# Patient Record
Sex: Female | Born: 1935
Health system: Southern US, Community
[De-identification: ages and names within clinical notes are randomized; demographics above are authoritative.]

## PROBLEM LIST (undated history)

## (undated) DIAGNOSIS — D649 Anemia, unspecified: Secondary | ICD-10-CM

## (undated) DIAGNOSIS — E7800 Pure hypercholesterolemia, unspecified: Secondary | ICD-10-CM

## (undated) DIAGNOSIS — F0390 Unspecified dementia without behavioral disturbance: Secondary | ICD-10-CM

## (undated) HISTORY — PX: BOWEL RESECTION: SHX1257

## (undated) HISTORY — PX: ABDOMINAL HYSTERECTOMY: SHX81

## (undated) HISTORY — PX: BREAST REDUCTION SURGERY: SHX8

## (undated) HISTORY — DX: Anemia, unspecified: D64.9

---

## 1997-06-09 ENCOUNTER — Encounter: Admission: RE | Admit: 1997-06-09 | Discharge: 1997-09-07 | Payer: Self-pay | Admitting: Internal Medicine

## 2002-11-04 ENCOUNTER — Ambulatory Visit (HOSPITAL_COMMUNITY): Admission: RE | Admit: 2002-11-04 | Discharge: 2002-11-04 | Payer: Self-pay | Admitting: Gastroenterology

## 2002-12-23 ENCOUNTER — Other Ambulatory Visit: Admission: RE | Admit: 2002-12-23 | Discharge: 2002-12-23 | Payer: Self-pay | Admitting: Obstetrics and Gynecology

## 2003-01-14 ENCOUNTER — Ambulatory Visit (HOSPITAL_COMMUNITY): Admission: RE | Admit: 2003-01-14 | Discharge: 2003-01-14 | Payer: Self-pay | Admitting: Obstetrics and Gynecology

## 2004-07-11 ENCOUNTER — Ambulatory Visit (HOSPITAL_COMMUNITY): Admission: RE | Admit: 2004-07-11 | Discharge: 2004-07-11 | Payer: Self-pay | Admitting: Specialist

## 2004-07-24 ENCOUNTER — Inpatient Hospital Stay (HOSPITAL_COMMUNITY): Admission: EM | Admit: 2004-07-24 | Discharge: 2004-07-26 | Payer: Self-pay | Admitting: Emergency Medicine

## 2005-10-11 ENCOUNTER — Inpatient Hospital Stay (HOSPITAL_COMMUNITY): Admission: EM | Admit: 2005-10-11 | Discharge: 2005-10-23 | Payer: Self-pay | Admitting: Emergency Medicine

## 2005-11-05 ENCOUNTER — Emergency Department (HOSPITAL_COMMUNITY): Admission: EM | Admit: 2005-11-05 | Discharge: 2005-11-06 | Payer: Self-pay | Admitting: Emergency Medicine

## 2011-04-13 ENCOUNTER — Telehealth: Payer: Self-pay

## 2011-04-13 NOTE — Telephone Encounter (Signed)
.  [facility]  Sent paperwork to Dr. Merla Riches a couple of weeks ago and has not rec'd back Fax [phone] Phone [phone]

## 2011-04-17 NOTE — Telephone Encounter (Signed)
Forms refaxed

## 2011-04-17 NOTE — Telephone Encounter (Signed)
Please refax paperwork that is attached to her chart.  It was originally faxed on 04/14/11.

## 2011-07-24 ENCOUNTER — Ambulatory Visit (INDEPENDENT_AMBULATORY_CARE_PROVIDER_SITE_OTHER): Payer: Medicare Other | Admitting: Internal Medicine

## 2011-07-24 VITALS — BP 115/57 | HR 67 | Temp 98.3°F | Resp 16 | Ht 61.5 in | Wt 120.4 lb

## 2011-07-24 DIAGNOSIS — K5790 Diverticulosis of intestine, part unspecified, without perforation or abscess without bleeding: Secondary | ICD-10-CM

## 2011-07-24 DIAGNOSIS — E785 Hyperlipidemia, unspecified: Secondary | ICD-10-CM | POA: Insufficient documentation

## 2011-07-24 DIAGNOSIS — E119 Type 2 diabetes mellitus without complications: Secondary | ICD-10-CM | POA: Insufficient documentation

## 2011-07-24 DIAGNOSIS — K56609 Unspecified intestinal obstruction, unspecified as to partial versus complete obstruction: Secondary | ICD-10-CM

## 2011-07-24 DIAGNOSIS — D509 Iron deficiency anemia, unspecified: Secondary | ICD-10-CM

## 2011-07-24 DIAGNOSIS — R630 Anorexia: Secondary | ICD-10-CM

## 2011-07-24 DIAGNOSIS — F341 Dysthymic disorder: Secondary | ICD-10-CM

## 2011-07-24 DIAGNOSIS — R2681 Unsteadiness on feet: Secondary | ICD-10-CM

## 2011-07-24 DIAGNOSIS — R269 Unspecified abnormalities of gait and mobility: Secondary | ICD-10-CM

## 2011-07-24 DIAGNOSIS — R634 Abnormal weight loss: Secondary | ICD-10-CM

## 2011-07-24 DIAGNOSIS — F411 Generalized anxiety disorder: Secondary | ICD-10-CM

## 2011-07-24 DIAGNOSIS — R7611 Nonspecific reaction to tuberculin skin test without active tuberculosis: Secondary | ICD-10-CM

## 2011-07-24 LAB — COMPREHENSIVE METABOLIC PANEL
ALT: 11 U/L (ref 0–35)
AST: 20 U/L (ref 0–37)
Albumin: 4.4 g/dL (ref 3.5–5.2)
Alkaline Phosphatase: 69 U/L (ref 39–117)
BUN: 24 mg/dL — ABNORMAL HIGH (ref 6–23)
CO2: 26 mEq/L (ref 19–32)
Calcium: 10 mg/dL (ref 8.4–10.5)
Chloride: 109 mEq/L (ref 96–112)
Creat: 0.93 mg/dL (ref 0.50–1.10)
Glucose, Bld: 129 mg/dL — ABNORMAL HIGH (ref 70–99)
Potassium: 4.4 mEq/L (ref 3.5–5.3)
Sodium: 144 mEq/L (ref 135–145)
Total Bilirubin: 0.4 mg/dL (ref 0.3–1.2)
Total Protein: 7.5 g/dL (ref 6.0–8.3)

## 2011-07-24 LAB — POCT CBC
Granulocyte percent: 61.4 %G (ref 37–80)
HCT, POC: 36.4 % — AB (ref 37.7–47.9)
Hemoglobin: 11.5 g/dL — AB (ref 12.2–16.2)
Lymph, poc: 2.4 (ref 0.6–3.4)
MCH, POC: 29.2 pg (ref 27–31.2)
MCHC: 31.6 g/dL — AB (ref 31.8–35.4)
MCV: 92.3 fL (ref 80–97)
MID (cbc): 0.3 (ref 0–0.9)
MPV: 8.3 fL (ref 0–99.8)
POC Granulocyte: 4.4 (ref 2–6.9)
POC LYMPH PERCENT: 33.8 %L (ref 10–50)
POC MID %: 4.8 %M (ref 0–12)
Platelet Count, POC: 319 10*3/uL (ref 142–424)
RBC: 3.94 M/uL — AB (ref 4.04–5.48)
RDW, POC: 14.7 %
WBC: 7.2 10*3/uL (ref 4.6–10.2)

## 2011-07-24 LAB — GLUCOSE, POCT (MANUAL RESULT ENTRY): POC Glucose: 103 mg/dl — AB (ref 70–99)

## 2011-07-24 LAB — POCT GLYCOSYLATED HEMOGLOBIN (HGB A1C): Hemoglobin A1C: 7.4

## 2011-07-24 MED ORDER — MECLIZINE HCL 12.5 MG PO TABS: 25.0000 mg | ORAL_TABLET | Freq: Every day | ORAL | Status: AC

## 2011-07-24 NOTE — Progress Notes (Signed)
Subjective:    Patient ID: Marcia Campbell, female    DOB: [birth year], 78 y.o.   MRN: [id number]  HPIYesterday when getting out of the bed she noticed an unsteady gait. She felt as if drunk. As she would try to walk she would fall to one side usually the left and has to steady herself. She noticed no change in vision and the room was not spinning. This persisted throughout the day. Today she is better but it is still present and she changes positions. There is no headache, no weakness, and no syncope. Her daughter has had to lead her around for the last 24 hours to keep her from falling. This is happened twice before in the last 5 years. After the second episode she was referred to neurology but they could not determine an etiology as in both other cases this resolves spontaneously within several days. Her daughter who is her primary caretaker has noticed several other recent symptoms. She has had a decreased appetite he is not drinking as much which has also been noticed at her day treatment program. She is often been asking for sugar. She is sleeping more than usual. Note that in the fall of 2012 her daughter called and asked for sleep medication for her. For the last 3 or 4 months she has been falling asleep shortly after dinner and sleeping 10 or 11 hours. She also has exhibited increased anxiety. As she arrives home from her day program with her daughter she has 45 minutes to an hour of fear that someone is broken in the house and has gone into her bedroom and taking things, and this anxiety is not relieved by inspecting everything and finding nothing wrong. She also expresses fears she drives into the neighborhood that no one is watching out for them and something bad might happen. She has been at the same dose of Zoloft for long-term. Her daughter notices no new depression symptoms.   Soc hx=[facility](adult daycare)-[facility] in [place]. Has living will and daughter is  POA  Review of Systems  Constitutional: Positive for appetite change and unexpected weight change. Negative for fever, activity change and fatigue.       She has lost 5 or 6 pounds since last year  HENT: Negative for hearing loss, ear pain, trouble swallowing, tinnitus and ear discharge.   Eyes: Negative for visual disturbance.  Respiratory: Negative for shortness of breath.   Cardiovascular: Negative for chest pain, palpitations and leg swelling.  Gastrointestinal: Negative for nausea, abdominal pain and constipation.  Genitourinary: Negative for urgency, frequency and difficulty urinating.  Musculoskeletal: Negative for myalgias, joint swelling and arthralgias.  Skin: Negative for rash.  Neurological: Negative for tremors, facial asymmetry, speech difficulty, weakness and headaches.       The tremor and dystonic movements she had a few years ago which were evaluated by neurology have resolved  Psychiatric/Behavioral: Negative for suicidal ideas, hallucinations and confusion.       Objective:   Physical Exam  Constitutional: She is oriented to person, place, and time. She appears well-developed and well-nourished. No distress.  HENT:  Right Ear: Tympanic membrane is not injected and not retracted. No middle ear effusion. No decreased hearing is noted.  Left Ear: Tympanic membrane is not injected and not retracted.  No middle ear effusion. No decreased hearing is noted.  Nose: No mucosal edema.  Mouth/Throat: Oropharynx is clear and moist and mucous membranes are normal. Normal dentition.  Eyes: Conjunctivae  and EOM are normal. Pupils are equal, round, and reactive to light.  Neck: Neck supple. No thyromegaly present.  Cardiovascular: Normal rate, regular rhythm, normal heart sounds and intact distal pulses.  Exam reveals no gallop.   No murmur heard.      No carotid bruit  Pulmonary/Chest: Effort normal and breath sounds normal.  Abdominal: Soft. She exhibits no distension and no  mass. There is no tenderness.       No organomegaly/no abdominal bruit  Musculoskeletal: She exhibits no edema.  Lymphadenopathy:    She has no cervical adenopathy.  Neurological: She is alert and oriented to person, place, and time. She has normal reflexes. No cranial nerve deficit.       Finger to nose intact without tremor Romberg negative Gait is not initially stable but she corrects herself quickly She was able to walk the length of the hall without touching the wall Deep tendon reflexes are 4+ in the lower [place]'s and 3+ in the upper extremities and symmetrical There no sensory losses Lying to sitting reproduce dizziness slightly without nystagmus and this resolved quickly Sitting to standing was the same  Psychiatric: She has a normal mood and affect. Thought content normal.        Results for orders placed in visit on 07/24/11  POCT CBC      Component Value Range   WBC 7.2  4.6 - 10.2 (K/uL)   Lymph, poc 2.4  0.6 - 3.4    POC LYMPH PERCENT 33.8  10 - 50 (%L)   MID (cbc) 0.3  0 - 0.9    POC MID % 4.8  0 - 12 (%M)   POC Granulocyte 4.4  2 - 6.9    Granulocyte percent 61.4  37 - 80 (%G)   RBC 3.94 (*) 4.04 - 5.48 (M/uL)   Hemoglobin 11.5 (*) 12.2 - 16.2 (g/dL)   HCT, POC 16.1 (*) 09.6 - 47.9 (%)   MCV 92.3  80 - 97 (fL)   MCH, POC 29.2  27 - 31.2 (pg)   MCHC 31.6 (*) 31.8 - 35.4 (g/dL)   RDW, POC 04.5     Platelet Count, POC 319  142 - 424 (K/uL)   MPV 8.3  0 - 99.8 (fL)  POCT GLYCOSYLATED HEMOGLOBIN (HGB A1C)      Component Value Range   Hemoglobin A1C 7.4    GLUCOSE, POCT (MANUAL RESULT ENTRY)      Component Value Range   POC Glucose 103 (*) 70 - 99 (mg/dl)    Assessment & Plan:   1. Poor appetite  POCT glucose (manual entry)  2. Unsteady gait    3. Abnormal weight loss  POCT CBC, Comprehensive metabolic panel, TSH  4. DM (diabetes mellitus) - Increase A1c from 7.0-7.4 in the last 9 months POCT glycosylated hemoglobin (Hb A1C), POCT glucose (manual entry)   5. GAD (generalized anxiety disorder) With depression and now with new symptoms of paranoia and/or obsessive thinking   6. Hyperlipemia on zocor    7. Anemia-normocytic/mild   Use meclizine 12.5 mg at at bedtime for the next 3-7 days If these symptoms do not resolve as they have in the past then further workup will be necessary Check metabolic profile and TSH After labs will consider something to stabilize her anxiety exacerbation

## 2011-07-25 ENCOUNTER — Encounter: Payer: Self-pay | Admitting: Internal Medicine

## 2011-07-25 DIAGNOSIS — D509 Iron deficiency anemia, unspecified: Secondary | ICD-10-CM | POA: Insufficient documentation

## 2011-07-25 DIAGNOSIS — R7611 Nonspecific reaction to tuberculin skin test without active tuberculosis: Secondary | ICD-10-CM | POA: Insufficient documentation

## 2011-07-25 DIAGNOSIS — K56609 Unspecified intestinal obstruction, unspecified as to partial versus complete obstruction: Secondary | ICD-10-CM | POA: Insufficient documentation

## 2011-07-25 DIAGNOSIS — H409 Unspecified glaucoma: Secondary | ICD-10-CM | POA: Insufficient documentation

## 2011-07-25 DIAGNOSIS — K5790 Diverticulosis of intestine, part unspecified, without perforation or abscess without bleeding: Secondary | ICD-10-CM | POA: Insufficient documentation

## 2011-07-25 LAB — TSH: TSH: 2.541 u[IU]/mL (ref 0.350–4.500)

## 2011-08-17 ENCOUNTER — Encounter (HOSPITAL_COMMUNITY): Payer: Self-pay | Admitting: *Deleted

## 2011-08-17 ENCOUNTER — Emergency Department (HOSPITAL_COMMUNITY)
Admission: EM | Admit: 2011-08-17 | Discharge: 2011-08-17 | Disposition: A | Discharge status: Home / Self Care | Payer: No Typology Code available for payment source | Attending: Emergency Medicine | Admitting: Emergency Medicine

## 2011-08-17 ENCOUNTER — Emergency Department (HOSPITAL_COMMUNITY): Payer: No Typology Code available for payment source

## 2011-08-17 DIAGNOSIS — Y92410 Unspecified street and highway as the place of occurrence of the external cause: Secondary | ICD-10-CM | POA: Insufficient documentation

## 2011-08-17 DIAGNOSIS — Y998 Other external cause status: Secondary | ICD-10-CM | POA: Insufficient documentation

## 2011-08-17 DIAGNOSIS — S335XXA Sprain of ligaments of lumbar spine, initial encounter: Secondary | ICD-10-CM | POA: Insufficient documentation

## 2011-08-17 DIAGNOSIS — Z794 Long term (current) use of insulin: Secondary | ICD-10-CM | POA: Insufficient documentation

## 2011-08-17 DIAGNOSIS — E119 Type 2 diabetes mellitus without complications: Secondary | ICD-10-CM | POA: Insufficient documentation

## 2011-08-17 DIAGNOSIS — Z79899 Other long term (current) drug therapy: Secondary | ICD-10-CM | POA: Insufficient documentation

## 2011-08-17 DIAGNOSIS — S39012A Strain of muscle, fascia and tendon of lower back, initial encounter: Secondary | ICD-10-CM

## 2011-08-17 DIAGNOSIS — F0390 Unspecified dementia without behavioral disturbance: Secondary | ICD-10-CM | POA: Insufficient documentation

## 2011-08-17 DIAGNOSIS — E7800 Pure hypercholesterolemia, unspecified: Secondary | ICD-10-CM | POA: Insufficient documentation

## 2011-08-17 HISTORY — DX: Unspecified dementia, unspecified severity, without behavioral disturbance, psychotic disturbance, mood disturbance, and anxiety: F03.90

## 2011-08-17 HISTORY — DX: Pure hypercholesterolemia, unspecified: E78.00

## 2011-08-17 MED ORDER — TRAMADOL HCL 50 MG PO TABS
50.0000 mg | ORAL_TABLET | Freq: Once | ORAL | Status: AC
  Administered 2011-08-17: 50 mg via ORAL
  Filled 2011-08-17: qty 1

## 2011-08-17 MED ORDER — TRAMADOL HCL 50 MG PO TABS: 50.0000 mg | ORAL_TABLET | Freq: Four times a day (QID) | ORAL | Status: AC | PRN
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
## 2011-08-17 NOTE — Discharge Instructions (Signed)

## 2011-08-17 NOTE — ED Notes (Signed)
Pt in xray
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
## 2011-08-17 NOTE — ED Notes (Signed)
Patient back from  X-ray 

## 2011-08-29 ENCOUNTER — Ambulatory Visit (INDEPENDENT_AMBULATORY_CARE_PROVIDER_SITE_OTHER): Payer: Medicare Other | Admitting: Internal Medicine

## 2011-08-29 VITALS — BP 119/62 | HR 64 | Temp 98.6°F | Resp 16 | Ht 61.0 in | Wt 120.0 lb

## 2011-08-29 DIAGNOSIS — F419 Anxiety disorder, unspecified: Secondary | ICD-10-CM

## 2011-08-29 DIAGNOSIS — R42 Dizziness and giddiness: Secondary | ICD-10-CM

## 2011-08-29 DIAGNOSIS — M542 Cervicalgia: Secondary | ICD-10-CM

## 2011-08-29 DIAGNOSIS — F411 Generalized anxiety disorder: Secondary | ICD-10-CM

## 2011-08-29 MED ORDER — MECLIZINE HCL 25 MG PO TABS: 25.0000 mg | ORAL_TABLET | Freq: Three times a day (TID) | ORAL | Status: AC | PRN
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
## 2011-11-25 ENCOUNTER — Other Ambulatory Visit: Payer: Self-pay | Admitting: Internal Medicine

## 2011-11-25 NOTE — Telephone Encounter (Signed)
Pt daughter calling to let Dr know that her mom is out of her metphormin and would like to know if rx could be sent in today, she states pharmacy has already faxed in rx to our office. Arie Sabina [phone]

## 2011-11-27 ENCOUNTER — Other Ambulatory Visit: Payer: Self-pay

## 2011-11-27 MED ORDER — METFORMIN HCL 500 MG PO TABS: 500.0000 mg | ORAL_TABLET | Freq: Every day | ORAL | Status: DC

## 2011-11-27 MED ORDER — SIMVASTATIN 20 MG PO TABS: 20.0000 mg | ORAL_TABLET | Freq: Every evening | ORAL | Status: DC

## 2011-11-27 MED ORDER — SERTRALINE HCL 100 MG PO TABS: 100.0000 mg | ORAL_TABLET | Freq: Every day | ORAL | Status: DC

## 2012-01-01 ENCOUNTER — Ambulatory Visit (INDEPENDENT_AMBULATORY_CARE_PROVIDER_SITE_OTHER): Payer: Medicare Other | Admitting: Internal Medicine

## 2012-01-01 VITALS — BP 118/70 | HR 70 | Temp 98.6°F | Resp 18 | Ht 61.0 in | Wt 120.4 lb

## 2012-01-01 DIAGNOSIS — M533 Sacrococcygeal disorders, not elsewhere classified: Secondary | ICD-10-CM

## 2012-01-01 DIAGNOSIS — R413 Other amnesia: Secondary | ICD-10-CM

## 2012-01-01 DIAGNOSIS — E785 Hyperlipidemia, unspecified: Secondary | ICD-10-CM

## 2012-01-01 DIAGNOSIS — E119 Type 2 diabetes mellitus without complications: Secondary | ICD-10-CM

## 2012-01-01 DIAGNOSIS — F419 Anxiety disorder, unspecified: Secondary | ICD-10-CM

## 2012-01-01 DIAGNOSIS — D649 Anemia, unspecified: Secondary | ICD-10-CM

## 2012-01-01 LAB — POCT CBC
Granulocyte percent: 56.3 %G (ref 37–80)
HCT, POC: 39.1 % (ref 37.7–47.9)
Hemoglobin: 11.9 g/dL — AB (ref 12.2–16.2)
Lymph, poc: 2.6 (ref 0.6–3.4)
MCH, POC: 29.3 pg (ref 27–31.2)
MCHC: 30.4 g/dL — AB (ref 31.8–35.4)
MCV: 96.2 fL (ref 80–97)
MID (cbc): 0.4 (ref 0–0.9)
MPV: 8.2 fL (ref 0–99.8)
POC Granulocyte: 3.9 (ref 2–6.9)
POC LYMPH PERCENT: 37.2 %L (ref 10–50)
POC MID %: 6.5 %M (ref 0–12)
Platelet Count, POC: 297 10*3/uL (ref 142–424)
RBC: 4.06 M/uL (ref 4.04–5.48)
RDW, POC: 14.1 %
WBC: 6.9 10*3/uL (ref 4.6–10.2)

## 2012-01-01 LAB — POCT GLYCOSYLATED HEMOGLOBIN (HGB A1C): Hemoglobin A1C: 7.2
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
## 2012-01-02 LAB — COMPREHENSIVE METABOLIC PANEL
ALT: 10 U/L (ref 0–35)
AST: 17 U/L (ref 0–37)
Albumin: 4.4 g/dL (ref 3.5–5.2)
Alkaline Phosphatase: 70 U/L (ref 39–117)
BUN: 21 mg/dL (ref 6–23)
CO2: 26 mEq/L (ref 19–32)
Calcium: 10.2 mg/dL (ref 8.4–10.5)
Chloride: 105 mEq/L (ref 96–112)
Creat: 1.07 mg/dL (ref 0.50–1.10)
Glucose, Bld: 123 mg/dL — ABNORMAL HIGH (ref 70–99)
Potassium: 4.3 mEq/L (ref 3.5–5.3)
Sodium: 140 mEq/L (ref 135–145)
Total Bilirubin: 0.4 mg/dL (ref 0.3–1.2)
Total Protein: 7.6 g/dL (ref 6.0–8.3)

## 2012-01-02 LAB — LIPID PANEL
Cholesterol: 236 mg/dL — ABNORMAL HIGH (ref 0–200)
HDL: 68 mg/dL (ref >39)
LDL Cholesterol: 150 mg/dL — ABNORMAL HIGH (ref 0–99)
Total CHOL/HDL Ratio: 3.5 Ratio
Triglycerides: 90 mg/dL (ref <150)
VLDL: 18 mg/dL (ref 0–40)

## 2012-01-03 LAB — VITAMIN B12: Vitamin B-12: 605 pg/mL (ref 211–911)

## 2012-01-03 LAB — FERRITIN: Ferritin: 137 ng/mL (ref 10–291)

## 2012-01-05 ENCOUNTER — Encounter: Payer: Self-pay | Admitting: Internal Medicine

## 2012-01-05 MED ORDER — SERTRALINE HCL 100 MG PO TABS: 100.0000 mg | ORAL_TABLET | Freq: Every day | ORAL | Status: DC

## 2012-01-05 MED ORDER — SIMVASTATIN 40 MG PO TABS: 40.0000 mg | ORAL_TABLET | Freq: Every evening | ORAL | Status: DC

## 2012-01-05 MED ORDER — METFORMIN HCL 500 MG PO TABS: 500.0000 mg | ORAL_TABLET | Freq: Every day | ORAL | Status: DC

## 2012-01-05 MED ORDER — INSULIN GLARGINE 100 UNIT/ML ~~LOC~~ SOLN: 12.0000 [IU] | Freq: Every day | SUBCUTANEOUS | Status: DC

## 2012-01-05 NOTE — Addendum Note (Signed)
Addended by: Tonye Pearson on: 01/05/2012 02:38 PM   Modules accepted: Orders

## 2012-03-14 ENCOUNTER — Telehealth: Payer: Self-pay

## 2012-03-14 DIAGNOSIS — F419 Anxiety disorder, unspecified: Secondary | ICD-10-CM

## 2012-03-14 DIAGNOSIS — F0390 Unspecified dementia without behavioral disturbance: Secondary | ICD-10-CM

## 2012-03-14 DIAGNOSIS — F341 Dysthymic disorder: Secondary | ICD-10-CM

## 2012-03-14 NOTE — Telephone Encounter (Signed)
LMOM to CB. 

## 2012-03-14 NOTE — Telephone Encounter (Signed)
She is not wanting to know if she can get additional meds for Anxiety. The Zoloft helped for a while but does not seem to be very helpful now. Daughter wants to know if this can be adjusted, since mother is increasingly confused and agitated. Please advise. Your last OV indicated she may need to see a neurologist for dementia, do you want to do this? And /or change / adjust meds/ please advise.
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
## 2012-03-15 MED ORDER — MIRTAZAPINE 7.5 MG PO TABS: 7.5000 mg | ORAL_TABLET | Freq: Every day | ORAL | Status: DC

## 2012-03-15 NOTE — Telephone Encounter (Signed)
Suspect increased anxiety due to increasing dementia  Set up neuro eval Add remeron 7.5 to zoloft until then I left this message with her daughter

## 2012-03-16 NOTE — Telephone Encounter (Signed)
Pt daughter called back checking on status of the medication request not sure that she received the message from below dated 03/15/12   Best number [phone]

## 2012-03-17 NOTE — Telephone Encounter (Signed)
Spoke with pt daughter and they have not got med.  Med may have been sent to [facility].  Will call [facility] and cancel rx and then call into [facility].

## 2012-03-19 ENCOUNTER — Telehealth: Payer: Self-pay

## 2012-03-19 NOTE — Telephone Encounter (Signed)
Medical form was sent, but was not completed. They need list, completed and printed and faxed to her. Fax [phone]

## 2012-03-19 NOTE — Telephone Encounter (Signed)
Marcia [facility] from [facility] is needing to talk with someone about this patients medication   Best number is [phone]  She stated that she feels like she is getting the run around because she was given dr [facility]olittles number [facility] this morning to help with this and then was told to call back here

## 2012-03-20 ENCOUNTER — Telehealth: Payer: Self-pay

## 2012-03-20 DIAGNOSIS — F341 Dysthymic disorder: Secondary | ICD-10-CM

## 2012-03-20 NOTE — Telephone Encounter (Signed)
Daughter wants to know when she can see changes in her mother with this new medication.   Please call [phone] (M)

## 2012-03-21 NOTE — Telephone Encounter (Signed)
Set up neuro eval  Add remeron 7.5 to zoloft until then  I left this message with her daughter

## 2012-03-22 NOTE — Telephone Encounter (Signed)
Called and spoke to daughter, and Marcia Campbell does still tend to have episodes of confusion/ anger mid day. Is disruptive and takes a while for her to calm down / about 1/2 an hour. meds do not seem to be helping yet. Daughter wants to know if this medication is going to be helpful, how long will it take to notice a change?

## 2012-03-23 NOTE — Telephone Encounter (Signed)
10days til works/call if not helpful

## 2012-03-24 NOTE — Telephone Encounter (Signed)
Called to advise. She is going out of town, needs refill of the Remeron. I have advised this was written with a refill. She will get from pharmacy.

## 2012-03-27 ENCOUNTER — Telehealth: Payer: Self-pay

## 2012-03-27 ENCOUNTER — Other Ambulatory Visit: Payer: Self-pay | Admitting: Internal Medicine

## 2012-03-27 NOTE — Telephone Encounter (Signed)
Notified rx sent in to [facility] today.

## 2012-03-27 NOTE — Telephone Encounter (Deleted)
PTS DAUGHTER IS CALLING IN REGAUARDS OF

## 2012-03-27 NOTE — Telephone Encounter (Signed)
PTS DAUGHTER IS CALLING IN REGARDS TO PT'S REMERON, SHE STATES THAT AMY INFORMED HER THAT THERE ARE REFILLS ON THIS MEDICATION BUT THE PHARMACY IS STATING THAT THERE ARE NOT ANY REFILLS. BEST# Cordelia Poche [phone]

## 2012-04-01 ENCOUNTER — Other Ambulatory Visit: Payer: Self-pay | Admitting: Diagnostic Neuroimaging

## 2012-04-01 DIAGNOSIS — F0390 Unspecified dementia without behavioral disturbance: Secondary | ICD-10-CM

## 2012-04-10 ENCOUNTER — Ambulatory Visit
Admission: RE | Admit: 2012-04-10 | Discharge: 2012-04-10 | Disposition: A | Discharge status: Home / Self Care | Payer: Medicare Other | Source: Ambulatory Visit | Attending: Diagnostic Neuroimaging | Admitting: Diagnostic Neuroimaging

## 2012-04-10 DIAGNOSIS — F0390 Unspecified dementia without behavioral disturbance: Secondary | ICD-10-CM

## 2012-04-20 ENCOUNTER — Telehealth: Payer: Self-pay

## 2012-04-20 NOTE — Telephone Encounter (Signed)
PT IS TAKING NEW MEDS AND IS MORE AGITATED  PLEASE HAVE DR. DOOLITTLE CALL [phone] [name]

## 2012-04-22 NOTE — Telephone Encounter (Signed)
More confused Doesn't recognize daughter now aricept started 2 weeks ago by Dr. Marjory Lies Also from orthopedics on HC/Voltaren  Daughter feels that she can no longer care for mom at home and we will discuss the appropriate paperwork I advised stopping the Voltaren and hydrocodone and if her confusion does not improve to ask neurology about Aricept Remeron is also a new drug we tried to control more for anxiety and this could be discontinued as it may not be offering much benefit

## 2012-04-22 NOTE — Telephone Encounter (Signed)
Do you want me to call pt. What questions should I ask pt? Please advise.

## 2012-04-26 ENCOUNTER — Ambulatory Visit (INDEPENDENT_AMBULATORY_CARE_PROVIDER_SITE_OTHER): Payer: MEDICARE | Admitting: Internal Medicine

## 2012-04-26 ENCOUNTER — Ambulatory Visit: Payer: Medicare Other

## 2012-04-26 VITALS — BP 115/64 | HR 82 | Temp 99.2°F | Resp 16 | Ht 62.0 in | Wt 113.0 lb

## 2012-04-26 DIAGNOSIS — R195 Other fecal abnormalities: Secondary | ICD-10-CM

## 2012-04-26 DIAGNOSIS — F0391 Unspecified dementia with behavioral disturbance: Secondary | ICD-10-CM

## 2012-04-26 DIAGNOSIS — F29 Unspecified psychosis not due to a substance or known physiological condition: Secondary | ICD-10-CM

## 2012-04-26 DIAGNOSIS — R109 Unspecified abdominal pain: Secondary | ICD-10-CM

## 2012-04-26 DIAGNOSIS — R451 Restlessness and agitation: Secondary | ICD-10-CM

## 2012-04-26 DIAGNOSIS — IMO0002 Reserved for concepts with insufficient information to code with codable children: Secondary | ICD-10-CM

## 2012-04-26 DIAGNOSIS — R410 Disorientation, unspecified: Secondary | ICD-10-CM

## 2012-04-26 MED ORDER — CITALOPRAM HYDROBROMIDE 40 MG PO TABS: 40.0000 mg | ORAL_TABLET | Freq: Every day | ORAL | Status: DC

## 2012-04-26 MED ORDER — POLYETHYLENE GLYCOL 3350 17 GM/SCOOP PO POWD: 17.0000 g | Freq: Every day | ORAL | Status: DC

## 2012-04-26 MED ORDER — RISPERIDONE 1 MG PO TABS: 1.0000 mg | ORAL_TABLET | Freq: Every day | ORAL | Status: DC

## 2012-04-26 MED ORDER — ALPRAZOLAM 0.25 MG PO TABS: 0.2500 mg | ORAL_TABLET | Freq: Every day | ORAL | Status: DC | PRN

## 2012-04-26 NOTE — Progress Notes (Signed)
Subjective:    Patient ID: Marcia Campbell, female    DOB: 11/02/1921, 79 y.o.   MRN: [id number]  HPI brought in today by her daughter and caretaker who is at her wits end with helping Mom thru her new dementia with agitation, paranoia and cognitive losses. Recently diagnosed by Dr. Winfield Rast and started on Aricept, but no improvement so far.  She also comes in today with complaints of abdominal pain over the last several weeks. Daughter reports that patient has been complaining of pain in her abdominal area area on a daily basis. Once she was off the diclofenac and the hydrocodone the pain began. She reports no problems with constipation. She has not been having normal bowel movements as she usual would. She has heard her mother strain in the restroom. There has been burning sensation. Daughter is concerned it is another obstruction. Denies blood in stool.    She reports that she has been anxious and upset recently. She states that she is going to a new center in the afternoon. She does not feel like she is treated as a person by the staff. They don't treat her well. The staff is stealing things, her artwork. She feels that people are just not like they used to be. She is confused at home finding where rooms are located. These are places that she should be familiar with. She feels like she knows where she wants to go but is confused on how to get there. Denies trouble eating. Sleeping she has been taking naps in the later morning.  She feels like no one likes her. People yell at her and call her names. Even her daughter "calls her a liar"   Patient states that today is Sunday and we have not had Valentines Day yet because she did not send a card.    Daughter is present and reports that the staff calls her at work and she can hear her Mother yelling that the other folks are stealing her green bag. She will sometimes bang on the tables and yell very loud. She will return home from her day center and  get very upset for about 45 minutes that someone has been in her room and disturbed her belongings. Patient frequently requests her daughter to sleep with her because she is afraid of being alone. Daughter explains that mother will be in the den watching tv and need to use the restroom. Patient will go to the kitchen door instead of the bathroom door. She gets confused on where things are in her own home.   Her sleep cycle does not seem to be disturbed when she uses Restoril at bedtime. Otherwise she has a difficult time settling down.     Review of Systems No vision changes No falls No change in gait No complaints of chest pain or shortness of breath No new tremor No fever No urinary symptoms    Objective:   Physical Exam BP 115/64  Pulse 82  Temp(Src) 99.2 F (37.3 C)  Resp 16  Ht 5\' 2"  (1.575 m)  Wt 113 lb (51.256 kg)  BMI 20.66 kg/m2 Dementia obvious during exam Abdomen soft/mild guarding/no masses or organomegaly/tender over the left lower quadrant without rebound/bowel sounds are present Heart regular without murmur No carotid bruits No abdominal bruits No peripheral edema/good peripheral pulses Cranial nerves II through XII intact Deep tendon reflexes intact Gait stable      Assessment & Plan:  Problem #1 recent onset dementia with behavioral disturbance  She has reached a point where she will need continuous care at home or in a facility  She is currently against living away from home and becomes agitated as we discussed, but this may be the best thing for every one.  It is very important to see if we can reduce her agitation, her anxiety in response to her memory loss, and her paranoia. Will change Zoloft to Celexa based done most recent studies. We'll add Risperdal at bedtime to see effect on paranoia Allowed daughter to try a small dose of Xanax when she is most agitated, being sure that this does not make her more likely to fall. Forward this note to  neurology for their assistance  Problem #2 abdominal pain with change in bowel movement pattern Start MiraLax and follow results     Medications  . donepezil (ARICEPT) 5 MG tablet    Sig: Take by mouth. ? mg's  . latanoprost (XALATAN) 0.005 % ophthalmic solution    Sig: 1 drop at bedtime.  . citalopram (CELEXA) 40 MG tablet    Sig: Take 1 tablet (40 mg total) by mouth daily.    Dispense:  30 tablet    Refill:  3  . risperiDONE (RISPERDAL) 1 MG tablet    Sig: Take 1 tablet (1 mg total) by mouth at bedtime.    Dispense:  30 tablet    Refill:  0  . ALPRAZolam (XANAX) 0.25 MG tablet    Sig: Take 1 tablet (0.25 mg total) by mouth daily as needed for sleep.    Dispense:  20 tablet    Refill:  0  . polyethylene glycol powder (GLYCOLAX/MIRALAX) powder    Sig: Take 17 g by mouth daily.    Dispense:  3350 g    Refill:  1    Prolonged office visit 50 minutes

## 2012-04-26 NOTE — Progress Notes (Deleted)
  Subjective:    Patient ID: Marcia Campbell, female    DOB: 1921-07-27, 79 y.o.   MRN: [id number]  HPI    Review of Systems     Objective:   Physical Exam        Assessment & Plan:

## 2012-04-28 ENCOUNTER — Telehealth: Payer: Self-pay

## 2012-04-28 DIAGNOSIS — F0391 Unspecified dementia with behavioral disturbance: Secondary | ICD-10-CM | POA: Insufficient documentation

## 2012-04-28 NOTE — Telephone Encounter (Signed)
Pt's daughter called and wants to make sure that dr. Merla Riches received her mother's FL2 form. Please call daughter Suwanee @ [phone]

## 2012-04-29 NOTE — Telephone Encounter (Signed)
Yes working on it

## 2012-04-30 LAB — IFOBT (OCCULT BLOOD): IFOBT: POSITIVE

## 2012-04-30 NOTE — Telephone Encounter (Signed)
Advised her we do have the form and we will let her know when it is ready.

## 2012-05-01 ENCOUNTER — Other Ambulatory Visit: Payer: Self-pay | Admitting: Internal Medicine

## 2012-05-05 NOTE — Addendum Note (Signed)
Addended by: Johnnette Litter on: 05/05/2012 10:01 AM   Modules accepted: Orders

## 2012-05-07 ENCOUNTER — Telehealth: Payer: Self-pay | Admitting: Family Medicine

## 2012-05-07 DIAGNOSIS — K921 Melena: Secondary | ICD-10-CM

## 2012-05-07 DIAGNOSIS — R1032 Left lower quadrant pain: Secondary | ICD-10-CM

## 2012-05-07 NOTE — Telephone Encounter (Signed)
Diannia Ruder from [facility] called wanting to know what Dr. Merla Riches was R/O on CT abd/pelvis with and without contrast to se if what he was looking for needed both. Spoke with Dr. Merla Riches and she has LLQ abd pain and heme positive stool, R/O diverticulitis/colitis/mass LLQ. Diannia Ruder said just needed with contrast so put in new order for CT abd pelvis with contrast.

## 2012-05-08 ENCOUNTER — Ambulatory Visit
Admission: RE | Admit: 2012-05-08 | Discharge: 2012-05-08 | Disposition: A | Discharge status: Home / Self Care | Payer: Medicare Other | Source: Ambulatory Visit | Attending: Internal Medicine | Admitting: Internal Medicine

## 2012-05-08 DIAGNOSIS — R1032 Left lower quadrant pain: Secondary | ICD-10-CM

## 2012-05-08 DIAGNOSIS — K921 Melena: Secondary | ICD-10-CM

## 2012-05-08 MED ORDER — IOHEXOL 300 MG/ML  SOLN
100.0000 mL | Freq: Once | INTRAMUSCULAR | Status: AC | PRN
  Administered 2012-05-08: 100 mL via INTRAVENOUS

## 2012-05-13 ENCOUNTER — Other Ambulatory Visit: Payer: Self-pay | Admitting: Radiology

## 2012-05-13 DIAGNOSIS — K921 Melena: Secondary | ICD-10-CM

## 2012-05-27 ENCOUNTER — Other Ambulatory Visit: Payer: Self-pay | Admitting: Internal Medicine

## 2012-06-04 NOTE — Progress Notes (Signed)
Received request from [facility] for clinical documentation to support prolonged service charge on 04/26/12. I faxed OV notes from that date to Danton Sewer at [phone] as requested and got confirmation fax went through.

## 2012-06-28 ENCOUNTER — Telehealth: Payer: Self-pay

## 2012-06-28 NOTE — Telephone Encounter (Signed)
We would be happy to see her for the knee pain. Called her daughter .She has noticed this with increased dose of simvastatin, advised daughter to d/c simvastatin and see if this resolves, to call back and let us know.

## 2012-06-28 NOTE — Telephone Encounter (Signed)
Pts daughter is calling because her mother is having knee pain Call back number is [phone]

## 2012-06-29 ENCOUNTER — Other Ambulatory Visit: Payer: Self-pay | Admitting: Physician Assistant

## 2012-08-18 ENCOUNTER — Other Ambulatory Visit: Payer: Self-pay | Admitting: Physician Assistant

## 2012-08-21 ENCOUNTER — Telehealth: Payer: Self-pay | Admitting: Diagnostic Neuroimaging

## 2012-08-21 NOTE — Telephone Encounter (Signed)
Left message for patient to call back, reschedule 09/30/12 appt with Dr. Marjory Lies

## 2012-08-26 ENCOUNTER — Other Ambulatory Visit: Payer: Self-pay | Admitting: Internal Medicine

## 2012-09-04 ENCOUNTER — Telehealth: Payer: Self-pay | Admitting: Diagnostic Neuroimaging

## 2012-09-23 ENCOUNTER — Other Ambulatory Visit: Payer: Self-pay | Admitting: Physician Assistant

## 2012-09-30 ENCOUNTER — Ambulatory Visit: Payer: MEDICARE | Admitting: Diagnostic Neuroimaging

## 2012-10-15 ENCOUNTER — Ambulatory Visit: Payer: MEDICARE | Admitting: Diagnostic Neuroimaging

## 2012-10-26 ENCOUNTER — Other Ambulatory Visit: Payer: Self-pay | Admitting: Physician Assistant

## 2012-11-05 ENCOUNTER — Other Ambulatory Visit: Payer: Self-pay | Admitting: Internal Medicine

## 2012-11-18 ENCOUNTER — Encounter: Payer: Self-pay | Admitting: Diagnostic Neuroimaging

## 2012-11-24 ENCOUNTER — Other Ambulatory Visit: Payer: Self-pay | Admitting: Physician Assistant

## 2012-12-12 ENCOUNTER — Encounter (INDEPENDENT_AMBULATORY_CARE_PROVIDER_SITE_OTHER): Payer: Self-pay

## 2012-12-12 ENCOUNTER — Ambulatory Visit (INDEPENDENT_AMBULATORY_CARE_PROVIDER_SITE_OTHER): Payer: MEDICARE | Admitting: Diagnostic Neuroimaging

## 2012-12-12 ENCOUNTER — Encounter: Payer: Self-pay | Admitting: Diagnostic Neuroimaging

## 2012-12-12 VITALS — BP 122/65 | HR 64 | Temp 97.6°F | Ht 62.0 in | Wt 111.0 lb

## 2012-12-12 DIAGNOSIS — R488 Other symbolic dysfunctions: Secondary | ICD-10-CM

## 2012-12-12 DIAGNOSIS — R482 Apraxia: Secondary | ICD-10-CM | POA: Insufficient documentation

## 2012-12-12 DIAGNOSIS — F0391 Unspecified dementia with behavioral disturbance: Secondary | ICD-10-CM

## 2012-12-12 NOTE — Patient Instructions (Signed)
Hold aricept for 2-4 weeks, to see if appetite and weight loss improve.

## 2012-12-12 NOTE — Progress Notes (Signed)
[facility]  PATIENT: Marcia Campbell DOB: 04/17/1921  REFERRING CLINICIAN:  HISTORY FROM: patient and daughter REASON FOR VISIT: follow up   HISTORICAL  CHIEF COMPLAINT:  Chief Complaint  Patient presents with  . Follow-up    memory loss    HISTORY OF PRESENT ILLNESS:   UPDATE 12/12/12: Since last visit, memory continues to decline. Also poor appetite and having gradual weight loss. Tries to drink ensure. Gait is declining. C/o right knee pain and stiffness. Having very short steps. Mood and paranoia are better since starting citalopram and risperdal.  PRIOR HPI (03/29/12): 79 year old right-handed female with diabetes, anxiety, here for valuation for possible dementia.  Previous is a patient in 2011 for abnormal involuntary movements, which were felt to be related to underlying anxiety. Now patient is referred for dementia evaluation. Patient is having progressive short-term memory problems, increasing anxiety and increasing paranoid thoughts over the past 2 years. The daughter spoke to me separately and also reported lifelong history of depression and paranoid thoughts. These are getting worse recently.  For example patient thinks that other people have taken things from her, moved her objects in her home, even put leaves on their driveway in the fall time. She does not have any threatening hallucinations or paranoid thoughts. Patient has some insight when we explained that these events have not recurred.   REVIEW OF SYSTEMS: Full 14 system review of systems performed and notable only for weight loss.  ALLERGIES: Allergies  Allergen Reactions  . Penicillins Itching    HOME MEDICATIONS: Outpatient Prescriptions Prior to Visit  Medication Sig Dispense Refill  . citalopram (CELEXA) 40 MG tablet Take 1 tablet (40 mg total) by mouth daily. PATIENT NEEDS OFFICE VISIT FOR ADDITIONAL REFILLS  30 tablet  0  . donepezil (ARICEPT) 5 MG tablet Take by mouth. ?  mg's      . insulin glargine (LANTUS SOLOSTAR) 100 UNIT/ML injection Inject 12 Units into the skin at bedtime.  10 mL  11  . metFORMIN (GLUCOPHAGE) 500 MG tablet Take 1 tablet (500 mg total) by mouth daily.  90 tablet  3  . risperiDONE (RISPERDAL) 1 MG tablet Take 1 tablet (1 mg total) by mouth daily. , at bedtime. PATIENT NEEDS OFFICE VISIT FOR ADDITIONAL REFILLS  30 tablet  0  . ALPRAZolam (XANAX) 0.25 MG tablet Take 1 tablet (0.25 mg total) by mouth daily as needed for sleep.  20 tablet  0  . latanoprost (XALATAN) 0.005 % ophthalmic solution 1 drop at bedtime.      . Multiple Vitamins-Minerals (MULTIVITAMIN WITH MINERALS) tablet Take 1 tablet by mouth daily.      . polyethylene glycol powder (GLYCOLAX/MIRALAX) powder Take 17 g by mouth daily.  3350 g  1  . simvastatin (ZOCOR) 40 MG tablet Take 1 tablet (40 mg total) by mouth every evening.  90 tablet  3   No facility-administered medications prior to visit.    PAST MEDICAL HISTORY: Past Medical History  Diagnosis Date  . Dementia   . Diabetes mellitus   . Hypercholesteremia     PAST SURGICAL HISTORY: Past Surgical History  Procedure Laterality Date  . Bowel resection    . Abdominal hysterectomy    . Breast reduction surgery      FAMILY HISTORY: Family History  Problem Relation Age of Onset  . Diabetes Sister   . Cancer Daughter     SOCIAL HISTORY:  History   Social History  . Marital Status: Widowed  Spouse Name: N/A    Number of Children: 1  . Years of Education: MA   Occupational History  . retired    Social History Main Topics  . Smoking status: Never Smoker   . Smokeless tobacco: Never Used  . Alcohol Use: No  . Drug Use: No  . Sexual Activity: Not on file   Other Topics Concern  . Not on file   Social History Narrative   Patient lives at home with daughter.   Caffeine Use: Occasionally tea     PHYSICAL EXAM  Filed Vitals:   12/12/12 1314  BP: 122/65  Pulse: 64  Temp: 97.6 F (36.4 C)    TempSrc: Oral  Height: 5\' 2"  (1.575 m)  Weight: 111 lb (50.349 kg)    Not recorded    Body mass index is 20.3 kg/(m^2).  GENERAL EXAM: General: Patient is awake, alert and in no acute distress.  Well developed and groomed. Cardiovascular:  Heart is regular rate and rhythm with no murmurs.  Neurologic Exam  Mental Status: Awake, alert. Language is fluent and comprehension intact. MMSE 18/30. GDS 10. POSITIVE GRASP, SNOUT, ROOTING, MYERSONS. Cranial Nerves: Pupils are equal and reactive to light.  Visual fields are full to confrontation.  Conjugate eye movements are full and symmetric.  Facial sensation and strength are symmetric.  Hearing is intact.  Palate elevated symmetrically and uvula is midline.  Shoulder shrug is symmetric.  Tongue is midline. Motor: Normal bulk and tone.  Full strength in the upper and lower extremities.  No pronator drift. Sensory: Intact and symmetric to light touch. Coordination: No ataxia or dysmetria on finger-nose or rapid alternating movement testing. Gait and Station: Narrow based gait, UNSTEADY GAIT. SHORT, SHUFFLING STEPS. SIGNIFICANT GAIT APRAXIA. EN BLOC TURNING.   DIAGNOSTIC DATA (LABS, IMAGING, TESTING) - I reviewed patient records, labs, notes, testing and imaging myself where available.  Lab Results  Component Value Date   WBC 6.9 01/01/2012   HGB 11.9* 01/01/2012   HCT 39.1 01/01/2012   MCV 96.2 01/01/2012      Component Value Date/Time   NA 140 01/01/2012 1911   K 4.3 01/01/2012 1911   CL 105 01/01/2012 1911   CO2 26 01/01/2012 1911   GLUCOSE 123* 01/01/2012 1911   BUN 21 01/01/2012 1911   CREATININE 1.07 01/01/2012 1911   CALCIUM 10.2 01/01/2012 1911   PROT 7.6 01/01/2012 1911   ALBUMIN 4.4 01/01/2012 1911   AST 17 01/01/2012 1911   ALT 10 01/01/2012 1911   ALKPHOS 70 01/01/2012 1911   BILITOT 0.4 01/01/2012 1911   Lab Results  Component Value Date   CHOL 236* 01/01/2012   HDL 68 01/01/2012   LDLCALC 150* 01/01/2012    TRIG 90 01/01/2012   CHOLHDL 3.5 01/01/2012   Lab Results  Component Value Date   HGBA1C 7.2 01/01/2012   Lab Results  Component Value Date   VITAMINB12 605 01/01/2012   Lab Results  Component Value Date   TSH 2.541 07/24/2011    04/10/12 CT head 1. Moderate perisylivan and temporal atrophy.  2. Mild periventricular and subcortical chronic small vessel ischemic disease.   ASSESSMENT AND PLAN  79 y.o. female with progressive short term memory problems, anxiety and paranoia.  MMSE 18/30. Positive frontal release signs. CT head shows significant atrophy.   Dx: moderate dementia (alzheimer's vs dementia with lewy bodies)  PLAN: 1. Hold aricept (due to symptoms of weight loss and poor appetite) 2. May consider namenda /- aricept in  future 3. PT eval for gait apraxia; patient likely needs an assistive device (rolling walker)   Orders Placed This Encounter  Procedures  . Ambulatory referral to Physical Therapy    Return in about 6 months (around 06/12/2013) for with Edison Nasuti, MD 12/12/2012, 2:06 PM Certified in Neurology, Neurophysiology and Neuroimaging  [facility] [address], [address] [place], [place] [address] [phone]

## 2012-12-18 ENCOUNTER — Ambulatory Visit (INDEPENDENT_AMBULATORY_CARE_PROVIDER_SITE_OTHER): Payer: MEDICARE | Admitting: Emergency Medicine

## 2012-12-18 VITALS — BP 110/66 | HR 70 | Temp 98.4°F | Resp 18 | Ht 62.0 in | Wt 109.0 lb

## 2012-12-18 DIAGNOSIS — Z23 Encounter for immunization: Secondary | ICD-10-CM

## 2012-12-18 DIAGNOSIS — E119 Type 2 diabetes mellitus without complications: Secondary | ICD-10-CM

## 2012-12-18 DIAGNOSIS — F0391 Unspecified dementia with behavioral disturbance: Secondary | ICD-10-CM

## 2012-12-18 DIAGNOSIS — F341 Dysthymic disorder: Secondary | ICD-10-CM

## 2012-12-18 LAB — LIPID PANEL: Cholesterol: 204 mg/dL — ABNORMAL HIGH (ref 0–200)

## 2012-12-18 LAB — POCT CBC
HCT, POC: 32.1 % — AB (ref 37.7–47.9)
Hemoglobin: 9.9 g/dL — AB (ref 12.2–16.2)
Lymph, poc: 1.3 (ref 0.6–3.4)
MCH, POC: 29.9 pg (ref 27–31.2)
MCHC: 30.8 g/dL — AB (ref 31.8–35.4)
MPV: 8.5 fL (ref 0–99.8)
POC MID %: 6 %M (ref 0–12)
RBC: 3.31 M/uL — AB (ref 4.04–5.48)
WBC: 4.2 10*3/uL — AB (ref 4.6–10.2)

## 2012-12-18 LAB — COMPREHENSIVE METABOLIC PANEL
ALT: <8 U/L (ref 0–35)
CO2: 29 mEq/L (ref 19–32)
Calcium: 9.8 mg/dL (ref 8.4–10.5)
Chloride: 104 mEq/L (ref 96–112)
Creat: 0.98 mg/dL (ref 0.50–1.10)
Glucose, Bld: 183 mg/dL — ABNORMAL HIGH (ref 70–99)
Sodium: 139 mEq/L (ref 135–145)
Total Bilirubin: 0.5 mg/dL (ref 0.3–1.2)
Total Protein: 7 g/dL (ref 6.0–8.3)

## 2012-12-18 LAB — POCT GLYCOSYLATED HEMOGLOBIN (HGB A1C): Hemoglobin A1C: 5.6

## 2012-12-18 LAB — TSH: TSH: 1.808 u[IU]/mL (ref 0.350–4.500)

## 2012-12-18 MED ORDER — CITALOPRAM HYDROBROMIDE 40 MG PO TABS: 40.0000 mg | ORAL_TABLET | Freq: Every day | ORAL | Status: DC

## 2012-12-18 MED ORDER — RISPERIDONE 1 MG PO TABS: 1.0000 mg | ORAL_TABLET | Freq: Every day | ORAL | Status: DC

## 2012-12-18 MED ORDER — METFORMIN HCL 500 MG PO TABS: 500.0000 mg | ORAL_TABLET | Freq: Every day | ORAL | Status: DC

## 2012-12-18 NOTE — Progress Notes (Signed)
[facility] [address], [place] [address] [phone]  Date:  12/18/2012   Name:  Marcia Campbell   DOB:  22-Feb-1922   MRN:  [id number]  PCP:  Tonye Pearson, MD    Chief Complaint: Medication Refill and Depression   History of Present Illness:  Marcia Campbell is a 79 y.o. very pleasant female patient who presents with the following:  Seen by neurology last week and they stopped the aricept due to appetite suppression concerns.  She is here for medication renewal.  Daughter concerned that she is losing weight and is not eating. She has since her medication change in February been less agitated but less interested in her surroundings such as ignoring favorite TV shows.  Daughter is concerned about her food intake at daycare and their lack of concern regarding her weight loss No improvement with over the counter medications or other home remedies.   Patient Active Problem List   Diagnosis Date Noted  . Moderate dementia with behavioral disturbance 12/12/2012  . 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 Glaucoma 07/25/2011  . Positive PPD, treated 07/25/2011  . Diverticulosis 07/25/2011  . Anemia, iron deficiency 07/25/2011  . Small bowel obstruction 07/25/2011  . DM (diabetes mellitus) 07/24/2011  . Hyperlipemia 07/24/2011  . Dysthymic disorder 07/24/2011    Past Medical History  Diagnosis Date  . Dementia   . Diabetes mellitus   . Hypercholesteremia     Past Surgical History  Procedure Laterality Date  . Bowel resection    . Abdominal hysterectomy    . Breast reduction surgery      History  Substance Use Topics  . Smoking status: Never Smoker   . Smokeless tobacco: Never Used  . Alcohol Use: No    Family History  Problem Relation Age of Onset  . Diabetes Sister   . Cancer Daughter     Allergies  Allergen Reactions  . Penicillins Itching    Medication list has been reviewed and updated.  Current Outpatient Prescriptions on File  Prior to Visit  Medication Sig Dispense Refill  . citalopram (CELEXA) 40 MG tablet Take 1 tablet (40 mg total) by mouth daily. PATIENT NEEDS OFFICE VISIT FOR ADDITIONAL REFILLS  30 tablet  0  . donepezil (ARICEPT) 5 MG tablet Take by mouth. ? mg's      . insulin glargine (LANTUS SOLOSTAR) 100 UNIT/ML injection Inject 12 Units into the skin at bedtime.  10 mL  11  . metFORMIN (GLUCOPHAGE) 500 MG tablet Take 1 tablet (500 mg total) by mouth daily.  90 tablet  3  . risperiDONE (RISPERDAL) 1 MG tablet Take 1 tablet (1 mg total) by mouth daily. , at bedtime. PATIENT NEEDS OFFICE VISIT FOR ADDITIONAL REFILLS  30 tablet  0  . traMADol (ULTRAM) 50 MG tablet Take 50 mg by mouth every 6 (six) hours as needed for pain.       No current facility-administered medications on file prior to visit.    Review of Systems:  As per HPI, otherwise negative.    Physical Examination: Filed Vitals:   12/18/12 1031  BP: 110/66  Pulse: 70  Temp: 98.4 F (36.9 C)  Resp: 18   Filed Vitals:   12/18/12 1031  Height: 5\' 2"  (1.575 m)  Weight: 109 lb (49.442 kg)   Body mass index is 19.93 kg/(m^2). Ideal Body Weight: Weight in (lb) to have BMI = 25: 136.4  GEN: WDWN, NAD, Non-toxic, A & O  x 3 HEENT: Atraumatic, Normocephalic. Neck supple. No masses, No LAD. Ears and Nose: No external deformity. CV: RRR, No M/G/R. No JVD. No thrill. No extra heart sounds. PULM: CTA B, no wheezes, crackles, rhonchi. No retractions. No resp. distress. No accessory muscle use. ABD: S, NT, ND, +BS. No rebound. No HSM. EXTR: No c/c/e NEURO unsteady gait.  PSYCH: Normally interactive. Conversant. Not depressedappearing.  Calm demeanor.    Assessment and Plan: Dementia Depression  Weight loss Signed,  Phillips Odor, MD

## 2012-12-18 NOTE — Patient Instructions (Signed)
Type 2 Diabetes Mellitus, Adult Type 2 diabetes mellitus, often simply referred to as type 2 diabetes, is a long-lasting (chronic) disease. In type 2 diabetes, the pancreas does not make enough insulin (a hormone), the cells are less responsive to the insulin that is made (insulin resistance), or both. Normally, insulin moves sugars from food into the tissue cells. The tissue cells use the sugars for energy. The lack of insulin or the lack of normal response to insulin causes excess sugars to build up in the blood instead of going into the tissue cells. As a result, high blood sugar (hyperglycemia) develops. The effect of high sugar (glucose) levels can cause many complications. Type 2 diabetes was also previously called adult-onset diabetes but it can occur at any age.  RISK FACTORS  A person is predisposed to developing type 2 diabetes if someone in the family has the disease and also has one or more of the following primary risk factors:  Overweight.  An inactive lifestyle.  A history of consistently eating high-calorie foods. Maintaining a normal weight and regular physical activity can reduce the chance of developing type 2 diabetes. SYMPTOMS  A person with type 2 diabetes may not show symptoms initially. The symptoms of type 2 diabetes appear slowly. The symptoms include:  Increased thirst (polydipsia).  Increased urination (polyuria).  Increased urination during the night (nocturia).  Weight loss. This weight loss may be rapid.  Frequent, recurring infections.  Tiredness (fatigue).  Weakness.  Vision changes, such as blurred vision.  Fruity smell to your breath.  Abdominal pain.  Nausea or vomiting.  Cuts or bruises which are slow to heal.  Tingling or numbness in the hands or feet. DIAGNOSIS Type 2 diabetes is frequently not diagnosed until complications of diabetes are present. Type 2 diabetes is diagnosed when symptoms or complications are present and when blood  glucose levels are increased. Your blood glucose level may be checked by one or more of the following blood tests:  A fasting blood glucose test. You will not be allowed to eat for at least 8 hours before a blood sample is taken.  A random blood glucose test. Your blood glucose is checked at any time of the day regardless of when you ate.  A hemoglobin A1c blood glucose test. A hemoglobin A1c test provides information about blood glucose control over the previous 3 months.  An oral glucose tolerance test (OGTT). Your blood glucose is measured after you have not eaten (fasted) for 2 hours and then after you drink a glucose-containing beverage. TREATMENT   You may need to take insulin or diabetes medicine daily to keep blood glucose levels in the desired range.  You will need to match insulin dosing with exercise and healthy food choices. The treatment goal is to maintain the before meal blood sugar (preprandial glucose) level at 70 130 mg/dL. HOME CARE INSTRUCTIONS   Have your hemoglobin A1c level checked twice a year.  Perform daily blood glucose monitoring as directed by your caregiver.  Monitor urine ketones when you are ill and as directed by your caregiver.  Take your diabetes medicine or insulin as directed by your caregiver to maintain your blood glucose levels in the desired range.  Never run out of diabetes medicine or insulin. It is needed every day.  Adjust insulin based on your intake of carbohydrates. Carbohydrates can raise blood glucose levels but need to be included in your diet. Carbohydrates provide vitamins, minerals, and fiber which are an essential part of   a healthy diet. Carbohydrates are found in fruits, vegetables, whole grains, dairy products, legumes, and foods containing added sugars.    Eat healthy foods. Alternate 3 meals with 3 snacks.  Lose weight if overweight.  Carry a medical alert card or wear your medical alert jewelry.  Carry a 15 gram  carbohydrate snack with you at all times to treat low blood glucose (hypoglycemia). Some examples of 15 gram carbohydrate snacks include:  Glucose tablets, 3 or 4   Glucose gel, 15 gram tube  Raisins, 2 tablespoons (24 grams)  Jelly beans, 6  Animal crackers, 8  Regular pop, 4 ounces (120 mL)  Gummy treats, 9  Recognize hypoglycemia. Hypoglycemia occurs with blood glucose levels of 70 mg/dL and below. The risk for hypoglycemia increases when fasting or skipping meals, during or after intense exercise, and during sleep. Hypoglycemia symptoms can include:  Tremors or shakes.  Decreased ability to concentrate.  Sweating.  Increased heart rate.  Headache.  Dry mouth.  Hunger.  Irritability.  Anxiety.  Restless sleep.  Altered speech or coordination.  Confusion.  Treat hypoglycemia promptly. If you are alert and able to safely swallow, follow the 15:15 rule:  Take 15 20 grams of rapid-acting glucose or carbohydrate. Rapid-acting options include glucose gel, glucose tablets, or 4 ounces (120 mL) of fruit juice, regular soda, or low fat milk.  Check your blood glucose level 15 minutes after taking the glucose.  Take 15 20 grams more of glucose if the repeat blood glucose level is still 70 mg/dL or below.  Eat a meal or snack within 1 hour once blood glucose levels return to normal.    Be alert to polyuria and polydipsia which are early signs of hyperglycemia. An early awareness of hyperglycemia allows for prompt treatment. Treat hyperglycemia as directed by your caregiver.  Engage in at least 150 minutes of moderate-intensity physical activity a week, spread over at least 3 days of the week or as directed by your caregiver. In addition, you should engage in resistance exercise at least 2 times a week or as directed by your caregiver.  Adjust your medicine and food intake as needed if you start a new exercise or sport.  Follow your sick day plan at any time you  are unable to eat or drink as usual.  Avoid tobacco use.  Limit alcohol intake to no more than 1 drink per day for nonpregnant women and 2 drinks per day for men. You should drink alcohol only when you are also eating food. Talk with your caregiver whether alcohol is safe for you. Tell your caregiver if you drink alcohol several times a week.  Follow up with your caregiver regularly.  Schedule an eye exam soon after the diagnosis of type 2 diabetes and then annually.  Perform daily skin and foot care. Examine your skin and feet daily for cuts, bruises, redness, nail problems, bleeding, blisters, or sores. A foot exam by a caregiver should be done annually.  Brush your teeth and gums at least twice a day and floss at least once a day. Follow up with your dentist regularly.  Share your diabetes management plan with your workplace or school.  Stay up-to-date with immunizations.  Learn to manage stress.  Obtain ongoing diabetes education and support as needed.  Participate in, or seek rehabilitation as needed to maintain or improve independence and quality of life. Request a physical or occupational therapy referral if you are having foot or hand numbness or difficulties with grooming,   dressing, eating, or physical activity. SEEK MEDICAL CARE IF:   You are unable to eat food or drink fluids for more than 6 hours.  You have nausea and vomiting for more than 6 hours.  Your blood glucose level is over 240 mg/dL.  There is a change in mental status.  You develop an additional serious illness.  You have diarrhea for more than 6 hours.  You have been sick or have had a fever for a couple of days and are not getting better.  You have pain during any physical activity.  SEEK IMMEDIATE MEDICAL CARE IF:  You have difficulty breathing.  You have moderate to large ketone levels. MAKE SURE YOU:  Understand these instructions.  Will watch your condition.  Will get help right away if  you are not doing well or get worse. Document Released: 02/20/2005 Document Revised: 11/15/2011 Document Reviewed: 09/19/2011 [employer] Patient Information 2014 [employer].  

## 2012-12-20 ENCOUNTER — Ambulatory Visit: Payer: Medicare Other | Attending: Diagnostic Neuroimaging | Admitting: Physical Therapy

## 2012-12-20 DIAGNOSIS — R269 Unspecified abnormalities of gait and mobility: Secondary | ICD-10-CM | POA: Insufficient documentation

## 2012-12-20 DIAGNOSIS — IMO0001 Reserved for inherently not codable concepts without codable children: Secondary | ICD-10-CM | POA: Insufficient documentation

## 2012-12-20 DIAGNOSIS — M6281 Muscle weakness (generalized): Secondary | ICD-10-CM | POA: Insufficient documentation

## 2012-12-23 ENCOUNTER — Ambulatory Visit: Payer: Medicare Other | Admitting: Physical Therapy

## 2012-12-25 ENCOUNTER — Ambulatory Visit: Payer: Medicare Other | Admitting: Physical Therapy

## 2012-12-30 ENCOUNTER — Ambulatory Visit: Payer: Medicare Other | Admitting: Physical Therapy

## 2013-01-02 ENCOUNTER — Ambulatory Visit: Payer: Medicare Other | Admitting: Physical Therapy

## 2013-01-07 ENCOUNTER — Ambulatory Visit: Payer: Medicare Other | Attending: Diagnostic Neuroimaging | Admitting: Physical Therapy

## 2013-01-07 DIAGNOSIS — M6281 Muscle weakness (generalized): Secondary | ICD-10-CM | POA: Insufficient documentation

## 2013-01-07 DIAGNOSIS — IMO0001 Reserved for inherently not codable concepts without codable children: Secondary | ICD-10-CM | POA: Insufficient documentation

## 2013-01-07 DIAGNOSIS — R269 Unspecified abnormalities of gait and mobility: Secondary | ICD-10-CM | POA: Insufficient documentation

## 2013-01-10 ENCOUNTER — Ambulatory Visit: Payer: Medicare Other | Admitting: Physical Therapy

## 2013-01-13 ENCOUNTER — Ambulatory Visit: Payer: Medicare Other | Admitting: Physical Therapy

## 2013-01-16 ENCOUNTER — Ambulatory Visit: Payer: Medicare Other | Admitting: Physical Therapy

## 2013-02-13 ENCOUNTER — Telehealth: Payer: Self-pay

## 2013-02-13 NOTE — Telephone Encounter (Signed)
Received form to be completed regarding pt's condition/info from last OV. I have completed what I can and put form in Dr Ewell Poe box for completion and signature.

## 2013-02-14 NOTE — Telephone Encounter (Signed)
Dr Dareen Piano, I believe you have completed this form and this encounter has been taken care of because I do not see it in your box now, but wanted to check before just closing it. If you have completed this for pt, please close encounter or let me know if I need to do anything else.

## 2013-02-19 ENCOUNTER — Telehealth: Payer: Self-pay

## 2013-02-19 DIAGNOSIS — E119 Type 2 diabetes mellitus without complications: Secondary | ICD-10-CM

## 2013-02-19 MED ORDER — INSULIN GLARGINE 100 UNIT/ML ~~LOC~~ SOLN: 12.0000 [IU] | Freq: Every day | SUBCUTANEOUS | Status: DC

## 2013-02-19 NOTE — Telephone Encounter (Signed)
Pended please advise.  

## 2013-02-19 NOTE — Telephone Encounter (Signed)
Patient's daughter states that her mother needs a renewal on her [employer]. They would also like to change pharmacies. Patient is currently using the [facility] on [address] but would like switch to [facility] on [address] and [address].   [phone]

## 2013-02-19 NOTE — Telephone Encounter (Signed)
Sent in to pharmacy.  

## 2013-03-12 ENCOUNTER — Encounter: Payer: Self-pay | Admitting: Diagnostic Neuroimaging

## 2013-03-12 ENCOUNTER — Telehealth: Payer: Self-pay | Admitting: Diagnostic Neuroimaging

## 2013-03-12 NOTE — Telephone Encounter (Signed)
Left message for patient stating that appointment on 06/12/13 has been rescheduled to 06/17/13 per Dr Marjory LiesPenumalli not being in office. Letter was printed and sent.

## 2013-04-27 ENCOUNTER — Other Ambulatory Visit: Payer: Self-pay | Admitting: Diagnostic Neuroimaging

## 2013-04-29 NOTE — Telephone Encounter (Signed)
I spoke with daughter, Ms Fraser Dinreston.  She said the patient is currently taking this medication and her appetite has been improving while on this drug.  She would like to get the med refilled.  If there are any changes she will call back, also they have an appt in April.

## 2013-05-14 NOTE — Telephone Encounter (Signed)
Pt came in for her visit closing encounter °

## 2013-05-15 DIAGNOSIS — Z0271 Encounter for disability determination: Secondary | ICD-10-CM

## 2013-05-16 ENCOUNTER — Ambulatory Visit (INDEPENDENT_AMBULATORY_CARE_PROVIDER_SITE_OTHER): Payer: MEDICARE | Admitting: Internal Medicine

## 2013-05-16 VITALS — BP 110/70 | HR 74 | Temp 98.8°F | Resp 16 | Ht 62.0 in | Wt 110.0 lb

## 2013-05-16 DIAGNOSIS — D649 Anemia, unspecified: Secondary | ICD-10-CM

## 2013-05-16 DIAGNOSIS — R634 Abnormal weight loss: Secondary | ICD-10-CM

## 2013-05-16 DIAGNOSIS — R233 Spontaneous ecchymoses: Secondary | ICD-10-CM

## 2013-05-16 DIAGNOSIS — E119 Type 2 diabetes mellitus without complications: Secondary | ICD-10-CM

## 2013-05-16 DIAGNOSIS — F0391 Unspecified dementia with behavioral disturbance: Secondary | ICD-10-CM

## 2013-05-16 DIAGNOSIS — F03918 Unspecified dementia, unspecified severity, with other behavioral disturbance: Secondary | ICD-10-CM

## 2013-05-16 DIAGNOSIS — R109 Unspecified abdominal pain: Secondary | ICD-10-CM

## 2013-05-16 LAB — POCT CBC
Granulocyte percent: 52.9 %G (ref 37–80)
HEMATOCRIT: 34.1 % — AB (ref 37.7–47.9)
HEMOGLOBIN: 10.8 g/dL — AB (ref 12.2–16.2)
LYMPH, POC: 2.3 (ref 0.6–3.4)
MCH: 30.8 pg (ref 27–31.2)
MCHC: 31.7 g/dL — AB (ref 31.8–35.4)
MCV: 97.1 fL — AB (ref 80–97)
MID (cbc): 0.4 (ref 0–0.9)
MPV: 10.4 fL (ref 0–99.8)
POC Granulocyte: 3 (ref 2–6.9)
POC LYMPH PERCENT: 40.3 %L (ref 10–50)
POC MID %: 6.8 %M (ref 0–12)
Platelet Count, POC: 45 10*3/uL — AB (ref 142–424)
RBC: 3.51 M/uL — AB (ref 4.04–5.48)
RDW, POC: 14.2 %
WBC: 5.7 10*3/uL (ref 4.6–10.2)

## 2013-05-16 LAB — POCT GLYCOSYLATED HEMOGLOBIN (HGB A1C): Hemoglobin A1C: 6.2

## 2013-05-16 NOTE — Progress Notes (Signed)
Subjective:    Patient ID: Marcia Campbell, female    DOB: 07-24-1921, 80 y.o.   MRN: [id number]  Chief Complaint  Patient presents with  . Generalized Body Aches    compains of being cold  . Joint Pain    knees, bruising of calf and knee on both legs  . Altered Mental Status  . Adult day care    Form   This chart was scribed for [name], MD by Dorothey Baseman, ED Scribe.   HPI Marcia Campbell is a 80 y.o. female with a history of diverticulosis and small bowel obstruction who presents to [facility] complaining of some intermittent, diffuse abdominal pain that she states is new for her. She reports that her appetite and PO intake have been normal, but her daughter states that the patient has lost some weight recently and does not eat as much while at her care facility. She reports giving the patient "Boost" nutrition supplement drink to help with this. Patient last had an abdominal CT about a year ago. She denies urinary frequency, fever, emesis. Patient also has a history of diabetes mellitus, and hyperlipidemia.  Patient is also complaining of some diffuse myalgias and arthralgias, most localized around the bilateral lower legs. Her daughter reports noticing some ecchymosis around these areas and that the patient frequently complains of being cold. She denies any joint swelling. She reports that the patient has been receiving cortisone injections. Patient has a history of gait apraxia and iron-deficiency anemia.   Patient also has a history of moderate dementia with behavioral disturbance. Patient states that the donepezil has been effective at managing her dementia and denies any recent changes in her mental status. Her daughter reports that the patient has been sitting on tables instead of chairs and has been "getting lost" at her care facility, which is unusual for her. Patient also has a history of dysthymic disorder.  Patient Active Problem List   Diagnosis  Date Noted  . Moderate dementia with behavioral disturbance 12/12/2012  . 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 Glaucoma 07/25/2011  . Positive PPD, treated 07/25/2011  . Diverticulosis 07/25/2011  . Anemia, iron deficiency 07/25/2011  . Small bowel obstruction 07/25/2011  . DM (diabetes mellitus) 07/24/2011  . Hyperlipemia 07/24/2011  . Dysthymic disorder 07/24/2011   Past Medical History  Diagnosis Date  . Dementia   . Diabetes mellitus   . Hypercholesteremia    Current Outpatient Prescriptions on File Prior to Visit  Medication Sig Dispense Refill  . citalopram (CELEXA) 40 MG tablet Take 1 tablet (40 mg total) by mouth daily.  30 tablet  5  . donepezil (ARICEPT) 10 MG tablet TAKE 1 TABLET BY MOUTH EVERY NIGHT AT BEDTIME  90 tablet  0  . insulin glargine (LANTUS) 100 UNIT/ML injection Inject 0.12 mLs (12 Units total) into the skin at bedtime.  10 mL  1  . metFORMIN (GLUCOPHAGE) 500 MG tablet Take 1 tablet (500 mg total) by mouth daily.  90 tablet  3  . risperiDONE (RISPERDAL) 1 MG tablet Take 1 tablet (1 mg total) by mouth daily.  30 tablet  5  . traMADol (ULTRAM) 50 MG tablet Take 50 mg by mouth every 6 (six) hours as needed for pain.       No current facility-administered medications on file prior to visit.   Allergies  Allergen Reactions  . Penicillins Itching   Review of Systems    As noted in the HPI, otherwise  non-contributory.  Objective:   Physical Exam  Nursing note and vitals reviewed. Constitutional: She appears well-developed and well-nourished. No distress.  HENT:  Head: Normocephalic and atraumatic.  Mouth/Throat: Oropharynx is clear and moist.  Eyes: Conjunctivae and EOM are normal. Pupils are equal, round, and reactive to light.  Neck: Normal range of motion. Neck supple. No thyromegaly present.  Cardiovascular: Normal rate, regular rhythm, normal heart sounds and intact distal pulses.   No murmur heard. Pulmonary/Chest: Effort normal. No respiratory distress.    Abdominal: She exhibits no distension.  Musculoskeletal: Normal range of motion. She exhibits no edema.  Knees have a mild restriction of complete extension, but no swelling or ligamentous laxity.   Lymphadenopathy:    She has no cervical adenopathy.  Neurological: She is alert.  Skin: Skin is warm and dry.  Ecchymosis over the bilateral patella. Ecchymosis laterally over the proximal fibula bilaterally.   Psychiatric: She has a normal mood and affect. Her behavior is normal.  Patient suffers from dementia. Pleasant interactions and able to answer questions but only with generalities and has to depend on her daughter for all critical thinking.   Results for orders placed in visit on 05/16/13  POCT CBC      Result Value Ref Range   WBC 5.7  4.6 - 10.2 K/uL   Lymph, poc 2.3  0.6 - 3.4   POC LYMPH PERCENT 40.3  10 - 50 %L   MID (cbc) 0.4  0 - 0.9   POC MID % 6.8  0 - 12 %M   POC Granulocyte 3.0  2 - 6.9   Granulocyte percent 52.9  37 - 80 %G   RBC 3.51 (*) 4.04 - 5.48 M/uL   Hemoglobin 10.8 (*) 12.2 - 16.2 g/dL   HCT, POC 29.5 (*) 28.4 - 47.9 %   MCV 97.1 (*) 80 - 97 fL   MCH, POC 30.8  27 - 31.2 pg   MCHC 31.7 (*) 31.8 - 35.4 g/dL   RDW, POC 13.2     Platelet Count, POC 45 (*) 142 - 424 K/uL   MPV 10.4  0 - 99.8 fL  POCT GLYCOSYLATED HEMOGLOBIN (HGB A1C)      Result Value Ref Range   Hemoglobin A1C 6.2     Wt Readings from Last 3 Encounters:  05/16/13 110 lb (49.896 kg)  12/18/12 109 lb (49.442 kg)  12/12/12 111 lb (50.349 kg)       BP 110/70  Pulse 74  Temp(Src) 98.8 F (37.1 C) (Oral)  Resp 16  Ht 5\' 2"  (1.575 m)  Wt 110 lb (49.896 kg)  BMI 20.11 kg/m2  SpO2 97%  Assessment & Plan:  4:38 PM- Will order blood labs and UA. Discussed treatment plan with patient at bedside and patient verbalized agreement.   5:41 PM- Patient is unable to provide a urine sample at this time. Discussed that the patient's hemoglobin A1c is 6.2, which is appropriate for the patient's  age and condition. Advised patient to stop the metformin and continue with the Lantus. Discussed that the patient's hemoglobin count is 10.8, so she may have some iron-deficiency anemia. Advised her to follow up to have heme occult testing done based on her prior history. Discussed that the patient will be notified of her additional blood lab results when they are completed. Discussed treatment plan with patient at bedside and patient verbalized agreement.    I have completed the patient encounter in its entirety as documented by the scribe,  with editing by me where necessary. Vayda Dungee P. Merla Richesoolittle, M.D. Type II or unspecified type diabetes mellitus without mention of complication, not stated as uncontrolled - Plan: Comprehensive metabolic panel, POCT glycosylated hemoglobin (Hb A1C)==6.2! Is  well controlled/discontinue metformin/continue Lantus 10 units at night and consider reducing further if  able  Abnormal weight loss - although daughter is concerned , the last 3 weights have been consistent   Plan: POCT CBC, TSH, T4, free  Abdominal pain, other specified site - Plan: POCT urinalysis dipstick, POCT UA - Microscopic Only--- daughter  will have to bring this in later as she could not collect today  Ecchymoses, spontaneous -crossing of legs frequently may not be worth stopping  Dementia with behavioral disturbance - Plan: Vitamin B12/cont day program-?when daughter will no longer be  able to care for her//continue meds  Anemia - Plan: Stool hematest again--this has been relatively stable for the last few years and CT of the  abdomen and pelvis was basically normal last year//restart iron supplements in multivitamins

## 2013-05-17 LAB — COMPREHENSIVE METABOLIC PANEL
ALT: 11 U/L (ref 0–35)
AST: 16 U/L (ref 0–37)
Albumin: 4.1 g/dL (ref 3.5–5.2)
Alkaline Phosphatase: 51 U/L (ref 39–117)
BUN: 28 mg/dL — AB (ref 6–23)
CALCIUM: 9.5 mg/dL (ref 8.4–10.5)
CHLORIDE: 102 meq/L (ref 96–112)
CO2: 25 mEq/L (ref 19–32)
CREATININE: 1.05 mg/dL (ref 0.50–1.10)
Glucose, Bld: 137 mg/dL — ABNORMAL HIGH (ref 70–99)
Potassium: 4.6 mEq/L (ref 3.5–5.3)
Sodium: 138 mEq/L (ref 135–145)
Total Bilirubin: 0.4 mg/dL (ref 0.2–1.2)
Total Protein: 7.1 g/dL (ref 6.0–8.3)

## 2013-05-17 LAB — TSH: TSH: 4.187 u[IU]/mL (ref 0.350–4.500)

## 2013-05-17 LAB — VITAMIN B12: Vitamin B-12: 783 pg/mL (ref 211–911)

## 2013-05-17 LAB — T4, FREE: FREE T4: 0.76 ng/dL — AB (ref 0.80–1.80)

## 2013-05-21 ENCOUNTER — Other Ambulatory Visit: Payer: Self-pay

## 2013-05-21 DIAGNOSIS — D696 Thrombocytopenia, unspecified: Secondary | ICD-10-CM

## 2013-05-22 LAB — POCT URINALYSIS DIPSTICK
Bilirubin, UA: NEGATIVE
Glucose, UA: NEGATIVE
KETONES UA: NEGATIVE
LEUKOCYTES UA: NEGATIVE
Nitrite, UA: NEGATIVE
PH UA: 7
Protein, UA: NEGATIVE
RBC UA: NEGATIVE
Spec Grav, UA: 1.015
Urobilinogen, UA: 0.2

## 2013-05-22 LAB — POCT UA - MICROSCOPIC ONLY
BACTERIA, U MICROSCOPIC: NEGATIVE
CASTS, UR, LPF, POC: NEGATIVE
CRYSTALS, UR, HPF, POC: NEGATIVE
Mucus, UA: NEGATIVE
RBC, urine, microscopic: NEGATIVE
Yeast, UA: NEGATIVE

## 2013-05-22 NOTE — Addendum Note (Signed)
Addended by: Cheri KearnsRIBBLE, Tynisa Vohs on: 05/22/2013 10:36 AM   Modules accepted: Orders

## 2013-05-23 ENCOUNTER — Telehealth: Payer: Self-pay | Admitting: Hematology and Oncology

## 2013-05-23 NOTE — Telephone Encounter (Signed)
LEFT MESSAGE FOR PATIENT TO RETURN CALL TO SCHEDULE NP APPT.  °

## 2013-05-26 ENCOUNTER — Telehealth: Payer: Self-pay | Admitting: Hematology and Oncology

## 2013-05-26 LAB — STOOL CULTURE

## 2013-05-26 NOTE — Telephone Encounter (Signed)
C/D 05/26/13 for appt. 05/28/13 °

## 2013-05-28 ENCOUNTER — Ambulatory Visit (HOSPITAL_BASED_OUTPATIENT_CLINIC_OR_DEPARTMENT_OTHER): Payer: Medicare Other

## 2013-05-28 ENCOUNTER — Encounter: Payer: Self-pay | Admitting: Hematology and Oncology

## 2013-05-28 ENCOUNTER — Ambulatory Visit (HOSPITAL_BASED_OUTPATIENT_CLINIC_OR_DEPARTMENT_OTHER): Payer: Medicare Other | Admitting: Hematology and Oncology

## 2013-05-28 VITALS — BP 128/69 | HR 66 | Temp 98.5°F | Resp 18 | Ht 62.0 in | Wt 109.4 lb

## 2013-05-28 DIAGNOSIS — D638 Anemia in other chronic diseases classified elsewhere: Secondary | ICD-10-CM

## 2013-05-28 DIAGNOSIS — D649 Anemia, unspecified: Secondary | ICD-10-CM

## 2013-05-28 DIAGNOSIS — D696 Thrombocytopenia, unspecified: Secondary | ICD-10-CM

## 2013-05-28 DIAGNOSIS — D539 Nutritional anemia, unspecified: Secondary | ICD-10-CM

## 2013-05-28 DIAGNOSIS — F0390 Unspecified dementia without behavioral disturbance: Secondary | ICD-10-CM

## 2013-05-28 HISTORY — DX: Anemia, unspecified: D64.9

## 2013-05-28 LAB — CBC & DIFF AND RETIC
BASO%: 0.2 % (ref 0.0–2.0)
Basophils Absolute: 0 10*3/uL (ref 0.0–0.1)
EOS%: 0.7 % (ref 0.0–7.0)
Eosinophils Absolute: 0 10*3/uL (ref 0.0–0.5)
HCT: 32.2 % — ABNORMAL LOW (ref 34.8–46.6)
HGB: 10.6 g/dL — ABNORMAL LOW (ref 11.6–15.9)
IMMATURE RETIC FRACT: 4.7 % (ref 1.60–10.00)
LYMPH#: 1.7 10*3/uL (ref 0.9–3.3)
LYMPH%: 31.3 % (ref 14.0–49.7)
MCH: 30.7 pg (ref 25.1–34.0)
MCHC: 32.9 g/dL (ref 31.5–36.0)
MCV: 93.3 fL (ref 79.5–101.0)
MONO#: 0.4 10*3/uL (ref 0.1–0.9)
MONO%: 7.9 % (ref 0.0–14.0)
NEUT#: 3.3 10*3/uL (ref 1.5–6.5)
NEUT%: 59.9 % (ref 38.4–76.8)
Platelets: 250 10*3/uL (ref 145–400)
RBC: 3.45 10*6/uL — AB (ref 3.70–5.45)
RDW: 13.4 % (ref 11.2–14.5)
RETIC %: 1.9 % (ref 0.70–2.10)
Retic Ct Abs: 65.55 10*3/uL (ref 33.70–90.70)
WBC: 5.5 10*3/uL (ref 3.9–10.3)

## 2013-05-28 LAB — LACTATE DEHYDROGENASE (CC13): LDH: 181 U/L (ref 125–245)

## 2013-05-28 NOTE — Progress Notes (Signed)
[facility] CONSULT NOTE  Patient Care Team: Tonye Pearson, MD as PCP - General (Family Medicine)  CHIEF COMPLAINTS/PURPOSE OF CONSULTATION:  Abnormal CBC, progressive anemia and thrombocytopenia  HISTORY OF PRESENTING ILLNESS:  Marcia Campbell 80 y.o. female is here because of abnormal CBC. The patient had history of dementia. Majority of the history is obtained through collaborated and with the patient's daughter and review of chart.  She was found to have abnormal CBC from recent routine blood work. I have the opportunity to review the patient's CBC from 2013 to present. The patient is noted to have anemia, ranging from 9.9-11.5. In terms of the platelet count, it usually ranging at 200, the most recent one was down to 45,000.  She denies recent chest pain on exertion, shortness of breath on minimal exertion, pre-syncopal episodes, or palpitations. She had not noticed any recent bleeding such as epistaxis, hematuria or hematochezia. The daughter noticed some bruising in her lower extremities. The patient denies over the counter NSAID ingestion. She is not on antiplatelets agents.  She had no prior history or diagnosis of cancer. Her age appropriate screening programs are up-to-date. She denies any pica and eats a variety of diet. Recently, the patient was discovered to have some anorexia but the border felt that she is eating adequate for her age and the level of activity.  MEDICAL HISTORY:  Past Medical History  Diagnosis Date  . Dementia   . Diabetes mellitus   . Hypercholesteremia   . Anemia, unspecified 05/28/2013    SURGICAL HISTORY: Past Surgical History  Procedure Laterality Date  . Bowel resection    . Abdominal hysterectomy    . Breast reduction surgery      SOCIAL HISTORY: History   Social History  . Marital Status: Widowed    Spouse Name: N/A    Number of Children: 1  . Years of Education: MA   Occupational History  . retired     Social History Main Topics  . Smoking status: Never Smoker   . Smokeless tobacco: Never Used  . Alcohol Use: No  . Drug Use: No  . Sexual Activity: Not on file   Other Topics Concern  . Not on file   Social History Narrative   Patient lives at home with daughter.   Caffeine Use: Occasionally tea    FAMILY HISTORY: Family History  Problem Relation Age of Onset  . Diabetes Sister   . Cancer Daughter     ALLERGIES:  is allergic to penicillins.  MEDICATIONS:  Current Outpatient Prescriptions  Medication Sig Dispense Refill  . citalopram (CELEXA) 40 MG tablet Take 1 tablet (40 mg total) by mouth daily.  30 tablet  5  . donepezil (ARICEPT) 10 MG tablet TAKE 1 TABLET BY MOUTH EVERY NIGHT AT BEDTIME  90 tablet  0  . insulin glargine (LANTUS) 100 UNIT/ML injection Inject 0.12 mLs (12 Units total) into the skin at bedtime.  10 mL  1  . risperiDONE (RISPERDAL) 1 MG tablet Take 1 tablet (1 mg total) by mouth daily.  30 tablet  5  . traMADol (ULTRAM) 50 MG tablet Take 50 mg by mouth every 6 (six) hours as needed for pain.       No current facility-administered medications for this visit.    REVIEW OF SYSTEMS:  Unreliable due to her background dementia. Constitutional: Denies fevers, chills or abnormal night sweats All other systems were reviewed with the patient and are negative.  PHYSICAL EXAMINATION:  ECOG PERFORMANCE STATUS: 1 - Symptomatic but completely ambulatory  Filed Vitals:   05/28/13 1159  BP: 128/69  Pulse: 66  Temp: 98.5 F (36.9 C)  Resp: 18   Filed Weights   05/28/13 1159  Weight: 109 lb 6.4 oz (49.624 kg)    GENERAL:alert, no distress and comfortable. She looks thin and mildly cachectic SKIN: skin color, texture, turgor are normal, no rashes or significant lesions EYES: normal, conjunctiva are pale and non-injected, sclera clear OROPHARYNX:no exudate, no erythema and lips, buccal mucosa, and tongue normal  NECK: supple, thyroid normal size,  non-tender, without nodularity LYMPH:  no palpable lymphadenopathy in the cervical, axillary or inguinal LUNGS: clear to auscultation and percussion with normal breathing effort HEART: regular rate & rhythm and no murmurs and no lower extremity edema ABDOMEN:abdomen soft, non-tender and normal bowel sounds Musculoskeletal:no cyanosis of digits and no clubbing  PSYCH: alert & oriented x 3 with fluent speech NEURO: no focal motor/sensory deficits  LABORATORY DATA:  I have reviewed the data as listed Recent Results (from the past 2160 hour(s))  COMPREHENSIVE METABOLIC PANEL     Status: Abnormal   Collection Time    05/16/13  4:47 PM      Result Value Ref Range   Sodium 138  135 - 145 mEq/L   Potassium 4.6  3.5 - 5.3 mEq/L   Chloride 102  96 - 112 mEq/L   CO2 25  19 - 32 mEq/L   Glucose, Bld 137 (*) 70 - 99 mg/dL   BUN 28 (*) 6 - 23 mg/dL   Creat 9.601.05  4.540.50 - 0.981.10 mg/dL   Total Bilirubin 0.4  0.2 - 1.2 mg/dL   Alkaline Phosphatase 51  39 - 117 U/L   AST 16  0 - 37 U/L   ALT 11  0 - 35 U/L   Total Protein 7.1  6.0 - 8.3 g/dL   Albumin 4.1  3.5 - 5.2 g/dL   Calcium 9.5  8.4 - 11.910.5 mg/dL  TSH     Status: None   Collection Time    05/16/13  4:47 PM      Result Value Ref Range   TSH 4.187  0.350 - 4.500 uIU/mL  VITAMIN B12     Status: None   Collection Time    05/16/13  4:47 PM      Result Value Ref Range   Vitamin B-12 783  211 - 911 pg/mL  T4, FREE     Status: Abnormal   Collection Time    05/16/13  4:47 PM      Result Value Ref Range   Free T4 0.76 (*) 0.80 - 1.80 ng/dL  POCT CBC     Status: Abnormal   Collection Time    05/16/13  5:32 PM      Result Value Ref Range   WBC 5.7  4.6 - 10.2 K/uL   Lymph, poc 2.3  0.6 - 3.4   POC LYMPH PERCENT 40.3  10 - 50 %L   MID (cbc) 0.4  0 - 0.9   POC MID % 6.8  0 - 12 %M   POC Granulocyte 3.0  2 - 6.9   Granulocyte percent 52.9  37 - 80 %G   RBC 3.51 (*) 4.04 - 5.48 M/uL   Hemoglobin 10.8 (*) 12.2 - 16.2 g/dL   HCT, POC 14.734.1 (*)  82.937.7 - 47.9 %   MCV 97.1 (*) 80 - 97 fL   MCH, POC 30.8  27 - 31.2 pg   MCHC 31.7 (*) 31.8 - 35.4 g/dL   RDW, POC 16.1     Platelet Count, POC 45 (*) 142 - 424 K/uL   MPV 10.4  0 - 99.8 fL  POCT GLYCOSYLATED HEMOGLOBIN (HGB A1C)     Status: None   Collection Time    05/16/13  5:32 PM      Result Value Ref Range   Hemoglobin A1C 6.2    STOOL CULTURE     Status: None   Collection Time    05/22/13 10:36 AM      Result Value Ref Range   Organism ID, Bacteria No Salmonella,Shigella,Campylobacter,Yersinia,or     Organism ID, Bacteria No E.coli [id number]:H7 isolated.    POCT URINALYSIS DIPSTICK     Status: None   Collection Time    05/22/13 10:49 AM      Result Value Ref Range   Color, UA yellow     Clarity, UA clear     Glucose, UA neg     Bilirubin, UA neg     Ketones, UA neg     Spec Grav, UA 1.015     Blood, UA neg     pH, UA 7.0     Protein, UA neg     Urobilinogen, UA 0.2     Nitrite, UA neg     Leukocytes, UA Negative    POCT UA - MICROSCOPIC ONLY     Status: None   Collection Time    05/22/13 10:49 AM      Result Value Ref Range   WBC, Ur, HPF, POC 0-1     RBC, urine, microscopic neg     Bacteria, U Microscopic neg     Mucus, UA neg     Epithelial cells, urine per micros 0-3     Comment: with clumps   Crystals, Ur, HPF, POC neg     Casts, Ur, LPF, POC neg     Yeast, UA neg     ASSESSMENT & PLAN #1 Chronic anemia This is likely anemia of chronic disease. The patient denies recent history of bleeding such as epistaxis, hematuria or hematochezia. She is asymptomatic from the anemia. We will observe for now.  She does not require transfusion now.  She has extensive workup already done by her primary doctor. I will order some blood work to rule out hemolysis. #2 acute thrombocytopenia I suspect this is a laboratory error. I will order peripheral smear to rule out platelet clumping. #3 dementia Due to difficulties with transportation, I did not make her a return  appointment. I will followup the test results and call her daughter. I will make my final recommendations after test results available. If she needs to come back for further evaluation or treatment, I will make the appropriate arrangements. All questions were answered. The patient knows to call the clinic with any problems, questions or concerns. I spent 40 minutes counseling the patient face to face. The total time spent in the appointment was 55 minutes and more than 50% was on counseling.     [facility], Damascus Feldpausch, MD 05/28/2013 12:31 PM

## 2013-05-28 NOTE — Progress Notes (Signed)
Checked in new patient with no financial issues.  °

## 2013-05-30 ENCOUNTER — Telehealth: Payer: Self-pay | Admitting: Hematology and Oncology

## 2013-05-30 LAB — SEDIMENTATION RATE: SED RATE: 12 mm/h (ref 0–22)

## 2013-05-30 LAB — DIRECT ANTIGLOBULIN TEST (NOT AT ARMC)
DAT (Complement): NEGATIVE
DAT IgG: NEGATIVE

## 2013-05-30 LAB — FOLATE: Folate: >20 ng/mL

## 2013-05-30 NOTE — Telephone Encounter (Signed)
I spoke with the patient's daughter. Repeat CBC show normal platelet count. She is to have chronic anemia, likely anemia of chronic disease. The patient is not symptomatic. No further followup or workup needs to be done.

## 2013-06-03 ENCOUNTER — Telehealth: Payer: Self-pay

## 2013-06-03 NOTE — Telephone Encounter (Signed)
Marcia Campbell STATES SHE BROUGHT IN A FORM FOR DR DOOLITTLE TO COMPLETE ON HER MOM, WOULD LIKE TO HAVE A COPY OF THE FORM FAXED TO HER THE FAX NUMBER IS [phone] AND YOU MAY REACH PT AT [phone] IF NEEDED

## 2013-06-09 NOTE — Telephone Encounter (Signed)
Daughter states mom is having firm potato size BM's they are soft.   Wants to know what to do for the size to be smaller.   [phone]

## 2013-06-11 NOTE — Telephone Encounter (Signed)
Lmom to call back. 

## 2013-06-11 NOTE — Telephone Encounter (Signed)
1)add fruit plus one dose miralax daily for 7 days  2) I can't find form--anyone know where it is?

## 2013-06-12 ENCOUNTER — Ambulatory Visit: Payer: MEDICARE | Admitting: Diagnostic Neuroimaging

## 2013-06-12 NOTE — Telephone Encounter (Signed)
Found form and it is now in your box in Dr lounge  I advise daughter about eating fruit and miralax.

## 2013-06-12 NOTE — Telephone Encounter (Signed)
lmom to call back 

## 2013-06-16 ENCOUNTER — Other Ambulatory Visit: Payer: Self-pay | Admitting: Emergency Medicine

## 2013-06-17 ENCOUNTER — Ambulatory Visit: Payer: MEDICARE | Admitting: Diagnostic Neuroimaging

## 2013-06-17 NOTE — Telephone Encounter (Signed)
Done scan

## 2013-06-17 NOTE — Telephone Encounter (Signed)
Form faxed Sent to scan in chart.

## 2013-07-16 ENCOUNTER — Other Ambulatory Visit: Payer: Self-pay | Admitting: Emergency Medicine

## 2013-08-07 ENCOUNTER — Other Ambulatory Visit: Payer: Self-pay | Admitting: Diagnostic Neuroimaging

## 2013-11-17 ENCOUNTER — Other Ambulatory Visit: Payer: Self-pay | Admitting: Internal Medicine

## 2013-11-17 ENCOUNTER — Other Ambulatory Visit: Payer: Self-pay | Admitting: Physician Assistant

## 2013-11-18 ENCOUNTER — Other Ambulatory Visit: Payer: Self-pay | Admitting: Internal Medicine

## 2013-12-25 ENCOUNTER — Ambulatory Visit (INDEPENDENT_AMBULATORY_CARE_PROVIDER_SITE_OTHER): Payer: MEDICARE | Admitting: Family Medicine

## 2013-12-25 ENCOUNTER — Other Ambulatory Visit: Payer: Self-pay | Admitting: Orthopedic Surgery

## 2013-12-25 VITALS — BP 116/70 | HR 62 | Temp 97.4°F | Resp 16 | Ht 61.5 in | Wt 111.0 lb

## 2013-12-25 DIAGNOSIS — Z23 Encounter for immunization: Secondary | ICD-10-CM

## 2013-12-25 DIAGNOSIS — E0865 Diabetes mellitus due to underlying condition with hyperglycemia: Secondary | ICD-10-CM

## 2013-12-25 DIAGNOSIS — M48061 Spinal stenosis, lumbar region without neurogenic claudication: Secondary | ICD-10-CM

## 2013-12-25 LAB — POCT GLYCOSYLATED HEMOGLOBIN (HGB A1C): Hemoglobin A1C: 6.8

## 2013-12-25 LAB — GLUCOSE, POCT (MANUAL RESULT ENTRY): POC Glucose: 158 mg/dl — AB (ref 70–99)

## 2013-12-25 NOTE — Progress Notes (Signed)
[facility] [address], [place] [address] [phone]  Date:  12/25/2013   Name:  Marcia Campbell   DOB:  [birth year]   MRN:  [id number]  PCP:  Tonye PearsonOLITTLE, ROBERT P, MD    Chief Complaint: Follow-up   History of Present Illness:  Marcia Campbell is a 80 y.o. very pleasant female patient who presents with the following:  She is here today with her daughter.  Marcia Campbell was recently on prednisone for coccydynia and maybe spinal stenosis.  She was on prednisone taper for 12 days total, and took her last dose 2 days ago.  They check her glucose at home sometimes- she has been running normally until she started steroids The nurse at her day center advised that she come in to be seen.   The last day of her steroid her glucose was 400, ok yesterday, 300 today She uses 12 units of lantus, no other DM medications   Lab Results  Component Value Date   HGBA1C 6.2 05/16/2013    Patient Active Problem List   Diagnosis Date Noted  . Anemia, unspecified 05/28/2013  . Thrombocytopenia, unspecified 05/28/2013  . Moderate dementia with behavioral disturbance 12/12/2012  . 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 Glaucoma 07/25/2011  . Positive PPD, treated 07/25/2011  . Diverticulosis 07/25/2011  . Anemia, iron deficiency 07/25/2011  . Small bowel obstruction 07/25/2011  . DM (diabetes mellitus) 07/24/2011  . Hyperlipemia 07/24/2011  . Dysthymic disorder 07/24/2011    Past Medical History  Diagnosis Date  . Dementia   . Diabetes mellitus   . Hypercholesteremia   . Anemia, unspecified 05/28/2013    Past Surgical History  Procedure Laterality Date  . Bowel resection    . Abdominal hysterectomy    . Breast reduction surgery      History  Substance Use Topics  . Smoking status: Never Smoker   . Smokeless tobacco: Never Used  . Alcohol Use: No    Family History  Problem Relation Age of Onset  . Diabetes Sister   . Cancer Daughter     Allergies  Allergen  Reactions  . Penicillins Itching    Medication list has been reviewed and updated.  Current Outpatient Prescriptions on File Prior to Visit  Medication Sig Dispense Refill  . citalopram (CELEXA) 40 MG tablet TAKE 1 TABLET BY MOUTH DAILY; NEED OFFICE VISIT FOR ADDITIONAL REFILLS  90 tablet  0  . donepezil (ARICEPT) 10 MG tablet TAKE 1 TABLET BY MOUTH EVERY NIGHT AT BEDTIME  90 tablet  1  . Insulin Glargine (LANTUS SOLOSTAR) 100 UNIT/ML Solostar Pen Inject 12 units into the skin at bedtime. PATIENT NEEDS OFFICE VISIT FOR ADDITIONAL REFILLS\  15 mL  0  . risperiDONE (RISPERDAL) 1 MG tablet TAKE 1 TABLET BY MOUTH EVERY DAY  30 tablet  3  . traMADol (ULTRAM) 50 MG tablet Take 50 mg by mouth every 6 (six) hours as needed for pain.      Marland Kitchen. insulin glargine (LANTUS) 100 UNIT/ML injection Inject 0.12 mLs (12 Units total) into the skin at bedtime.  10 mL  1   No current facility-administered medications on file prior to visit.    Review of Systems:  As per HPI- otherwise negative. Pt states that she feels well.  Her daughter has not noted any particular symptoms otherwise   Physical Examination: Filed Vitals:   12/25/13 1326  BP: 116/70  Pulse: 62  Temp: 97.4 F (36.3 C)  Resp: 16  Filed Vitals:   12/25/13 1326  Height: 5' 1.5" (1.562 m)  Weight: 111 lb (50.349 kg)   Body mass index is 20.64 kg/(m^2). Ideal Body Weight: Weight in (lb) to have BMI = 25: 134.2  GEN: WDWN, NAD, Non-toxic, A & O x 3, well groomed and cared for. Does seem to have dementia HEENT: Atraumatic, Normocephalic. Neck supple. No masses, No LAD. Ears and Nose: No external deformity. CV: RRR, No M/G/R. No JVD. No thrill. No extra heart sounds. PULM: CTA B, no wheezes, crackles, rhonchi. No retractions. No resp. distress. No accessory muscle use. EXTR: No c/c/e NEURO Normal gait.  PSYCH: Normally interactive. Conversant. Not depressed or anxious appearing.  Calm demeanor.   Results for orders placed in visit  on 12/25/13  GLUCOSE, POCT (MANUAL RESULT ENTRY)      Result Value Ref Range   POC Glucose 158 (*) 70 - 99 mg/dl  POCT GLYCOSYLATED HEMOGLOBIN (HGB A1C)      Result Value Ref Range   Hemoglobin A1C 6.8        Assessment and Plan: Diabetes mellitus due to underlying condition with hyperglycemia - Plan: POCT glucose (manual entry), POCT glycosylated hemoglobin (Hb A1C), Comprehensive metabolic panel  Need for prophylactic vaccination and inoculation against influenza - Plan: Flu Vaccine QUAD 36+ mos IM  Reassured that her A1c still looks good and it seems that the effects of prednisone are wearing off. They will let me know if any other concerns and I will be in touch with the rest of her labs Flu shot today Plan to follow-up with Dr. Merla Richesoolittle as usual   Signed Abbe AmsterdamJessica Jerid Catherman, MD

## 2013-12-25 NOTE — Patient Instructions (Signed)
Good to see you today- I will be in touch with the rest of your labs.  I think that your blood sugar will now get back to normal but let me know if you continue to have trouble.   You got a flu shot today as well

## 2013-12-26 ENCOUNTER — Encounter: Payer: Self-pay | Admitting: Family Medicine

## 2013-12-26 LAB — COMPREHENSIVE METABOLIC PANEL
ALT: 36 U/L — ABNORMAL HIGH (ref 0–35)
AST: 22 U/L (ref 0–37)
Albumin: 3.7 g/dL (ref 3.5–5.2)
Alkaline Phosphatase: 52 U/L (ref 39–117)
BILIRUBIN TOTAL: 0.5 mg/dL (ref 0.2–1.2)
BUN: 20 mg/dL (ref 6–23)
CHLORIDE: 102 meq/L (ref 96–112)
CO2: 25 meq/L (ref 19–32)
CREATININE: 0.95 mg/dL (ref 0.50–1.10)
Calcium: 9.2 mg/dL (ref 8.4–10.5)
Glucose, Bld: 155 mg/dL — ABNORMAL HIGH (ref 70–99)
Potassium: 4.3 mEq/L (ref 3.5–5.3)
Sodium: 135 mEq/L (ref 135–145)
Total Protein: 6.6 g/dL (ref 6.0–8.3)

## 2014-01-05 ENCOUNTER — Ambulatory Visit
Admission: RE | Admit: 2014-01-05 | Discharge: 2014-01-05 | Disposition: A | Discharge status: Home / Self Care | Payer: Medicare Other | Source: Ambulatory Visit | Attending: Orthopedic Surgery | Admitting: Orthopedic Surgery

## 2014-01-05 DIAGNOSIS — M48061 Spinal stenosis, lumbar region without neurogenic claudication: Secondary | ICD-10-CM

## 2014-02-07 ENCOUNTER — Other Ambulatory Visit: Payer: Self-pay | Admitting: Diagnostic Neuroimaging

## 2014-02-08 NOTE — Telephone Encounter (Signed)
No showed last appt  

## 2014-02-09 NOTE — Telephone Encounter (Signed)
I called patient.  They will contact our office to reschedule appt.

## 2014-02-13 ENCOUNTER — Other Ambulatory Visit: Payer: Self-pay | Admitting: Diagnostic Neuroimaging

## 2014-03-11 ENCOUNTER — Other Ambulatory Visit: Payer: Self-pay | Admitting: Internal Medicine

## 2014-03-31 ENCOUNTER — Other Ambulatory Visit: Payer: Self-pay | Admitting: Physician Assistant

## 2014-04-01 ENCOUNTER — Encounter: Payer: Self-pay | Admitting: Diagnostic Neuroimaging

## 2014-04-01 ENCOUNTER — Ambulatory Visit (INDEPENDENT_AMBULATORY_CARE_PROVIDER_SITE_OTHER): Payer: Medicare Other | Admitting: Diagnostic Neuroimaging

## 2014-04-01 VITALS — BP 131/56 | HR 69 | Temp 97.9°F | Ht 60.0 in

## 2014-04-01 DIAGNOSIS — F03C0 Unspecified dementia, severe, without behavioral disturbance, psychotic disturbance, mood disturbance, and anxiety: Secondary | ICD-10-CM

## 2014-04-01 DIAGNOSIS — F0390 Unspecified dementia without behavioral disturbance: Secondary | ICD-10-CM

## 2014-04-01 NOTE — Patient Instructions (Signed)
I will setup palliative care and rollator walker prescription.

## 2014-04-01 NOTE — Progress Notes (Signed)
[facility]  PATIENT: Marcia Campbell DOB: Jun 01, 1921  REFERRING CLINICIAN:  HISTORY FROM: patient and daughter REASON FOR VISIT: follow up   HISTORICAL  CHIEF COMPLAINT:  Chief Complaint  Patient presents with  . Follow-up    moderate dementia     HISTORY OF PRESENT ILLNESS:   UPDATE 04/01/14: Since last visit, continued decline in memory and ADLs. Daughter helping with daily routine. Patient goes to adult daycare M-F 8-4.   UPDATE 12/12/12: Since last visit, memory continues to decline. Also poor appetite and having gradual weight loss. Tries to drink ensure. Gait is declining. C/o right knee pain and stiffness. Having very short steps. Mood and paranoia are better since starting citalopram and risperdal.  PRIOR HPI (03/29/12): 81 year old right-handed female with diabetes, anxiety, here for valuation for possible dementia. Previous is a patient in 2011 for abnormal involuntary movements, which were felt to be related to underlying anxiety. Now patient is referred for dementia evaluation. Patient is having progressive short-term memory problems, increasing anxiety and increasing paranoid thoughts over the past 2 years. The daughter spoke to me separately and also reported lifelong history of depression and paranoid thoughts. These are getting worse recently. For example patient thinks that other people have taken things from her, moved her objects in her home, even put leaves on their driveway in the fall time. She does not have any threatening hallucinations or paranoid thoughts. Patient has some insight when we explained that these events have not recurred.   REVIEW OF SYSTEMS: Full 14 system review of systems performed and notable only for weight loss.  ALLERGIES: Allergies  Allergen Reactions  . Penicillins Itching    HOME MEDICATIONS: Outpatient Prescriptions Prior to Visit  Medication Sig Dispense Refill  . citalopram (CELEXA) 40 MG tablet Take 1  tablet by mouth daily.  "needs office visit for additional refills" 30 tablet 0  . donepezil (ARICEPT) 10 MG tablet TAKE 1 TABLET BY MOUTH EVERY NIGHT AT BEDTIME 30 tablet 0  . gabapentin (NEURONTIN) 100 MG capsule Take 100 mg by mouth 2 (two) times daily.    . Insulin Glargine (LANTUS SOLOSTAR) 100 UNIT/ML Solostar Pen Inject 12 units into the skin at bedtime. PATIENT NEEDS OFFICE VISIT FOR ADDITIONAL REFILLS\ 15 mL 0  . risperiDONE (RISPERDAL) 1 MG tablet Take 1 tablet (1 mg total) by mouth daily. PATIENT NEEDS OFFICE VISIT FOR ADDITIONAL REFILLS 30 tablet 0  . traMADol (ULTRAM) 50 MG tablet Take 50 mg by mouth every 6 (six) hours as needed for pain.    Marland Kitchen insulin glargine (LANTUS) 100 UNIT/ML injection Inject 0.12 mLs (12 Units total) into the skin at bedtime. 10 mL 1   No facility-administered medications prior to visit.    PAST MEDICAL HISTORY: Past Medical History  Diagnosis Date  . Dementia   . Diabetes mellitus   . Hypercholesteremia   . Anemia, unspecified 05/28/2013    PAST SURGICAL HISTORY: Past Surgical History  Procedure Laterality Date  . Bowel resection    . Abdominal hysterectomy    . Breast reduction surgery      FAMILY HISTORY: Family History  Problem Relation Age of Onset  . Diabetes Sister   . Cancer Daughter     SOCIAL HISTORY:  History   Social History  . Marital Status: Widowed    Spouse Name: N/A    Number of Children: 1  . Years of Education: MA   Occupational History  . retired    Social History  Main Topics  . Smoking status: Never Smoker   . Smokeless tobacco: Never Used  . Alcohol Use: No  . Drug Use: No  . Sexual Activity: Not on file   Other Topics Concern  . Not on file   Social History Narrative   Patient lives at home with daughter.   Caffeine Use: Occasionally tea     PHYSICAL EXAM  Filed Vitals:   04/01/14 1339  BP: 131/56  Pulse: 69  Temp: 97.9 F (36.6 C)  TempSrc: Oral  Height: 5' (1.524 m)    Not recorded       There is no weight on file to calculate BMI.  MMSE - Mini Mental State Exam 04/01/2014  Orientation to time 0  Orientation to Place 0  Registration 2  Attention/ Calculation 0  Recall 0  Language- name 2 objects 2  Language- repeat 1  Language- follow 3 step command 1  Language- read & follow direction 0  Write a sentence 0  Copy design 0  Total score 6     GENERAL EXAM: General: Patient is awake, alert and in no acute distress.  Well developed and groomed. Cardiovascular:  Heart is regular rate and rhythm with no murmurs.  Neurologic Exam  Mental Status: Awake, alert. Language is fluent and comprehension intact. MMSE 6/30. POSITIVE GRASP, SNOUT, ROOTING, MYERSONS. Cranial Nerves: Pupils are equal and reactive to light.  Visual fields are full to confrontation.  Conjugate eye movements are full and symmetric.  Facial sensation and strength are symmetric.  Hearing is intact.  Palate elevated symmetrically and uvula is midline.  Shoulder shrug is symmetric.  Tongue is midline. Motor: Normal bulk and tone.  Full strength in the upper and lower extremities.  No pronator drift. Sensory: Intact and symmetric to light touch. Coordination: No ataxia or dysmetria on finger-nose or rapid alternating movement testing. Gait and Station: Narrow based gait, UNSTEADY GAIT. SHORT, SHUFFLING STEPS. SIGNIFICANT GAIT APRAXIA. EN BLOC TURNING.   DIAGNOSTIC DATA (LABS, IMAGING, TESTING) - I reviewed patient records, labs, notes, testing and imaging myself where available.  Lab Results  Component Value Date   WBC 5.5 05/28/2013   HGB 10.6* 05/28/2013   HCT 32.2* 05/28/2013   MCV 93.3 05/28/2013   PLT 250 05/28/2013      Component Value Date/Time   NA 135 12/25/2013 1400   K 4.3 12/25/2013 1400   CL 102 12/25/2013 1400   CO2 25 12/25/2013 1400   GLUCOSE 155* 12/25/2013 1400   BUN 20 12/25/2013 1400   CREATININE 0.95 12/25/2013 1400   CALCIUM 9.2 12/25/2013 1400   PROT 6.6  12/25/2013 1400   ALBUMIN 3.7 12/25/2013 1400   AST 22 12/25/2013 1400   ALT 36* 12/25/2013 1400   ALKPHOS 52 12/25/2013 1400   BILITOT 0.5 12/25/2013 1400   Lab Results  Component Value Date   CHOL 204* 12/18/2012   HDL 76 12/18/2012   LDLCALC 119* 12/18/2012   TRIG 46 12/18/2012   CHOLHDL 2.7 12/18/2012   Lab Results  Component Value Date   HGBA1C 6.8 12/25/2013   Lab Results  Component Value Date   VITAMINB12 783 05/16/2013   Lab Results  Component Value Date   TSH 4.187 05/16/2013    04/10/12 CT head 1. Moderate perisylivan and temporal atrophy.  2. Mild periventricular and subcortical chronic small vessel ischemic disease.   ASSESSMENT AND PLAN  81 y.o. female with progressive short term memory problems, anxiety and paranoia.  MMSE 6/30. Positive  frontal release signs. CT head shows significant atrophy. Advanced age and severe dementia.   Dx: severe dementia (alzheimer's vs dementia with lewy bodies)  PLAN: 1. Had long conversation with patient and daughter about goals of care, severity of dementia and palliative care options. Will setup palliative care consult to define goals of care and help transition focus towards comfort and away from unnecessary/futile medical testing and treatments. 2. Follow up as needed.   Orders Placed This Encounter  Procedures  . For home use only DME 4 wheeled rolling walker with seat  . Amb Referral to Palliative Care    No Follow-up on file.    Suanne Marker, MD 04/01/2014, 2:20 PM Certified in Neurology, Neurophysiology and Neuroimaging  [facility] [address] [place], [place] [address] [phone]

## 2014-04-21 ENCOUNTER — Ambulatory Visit (INDEPENDENT_AMBULATORY_CARE_PROVIDER_SITE_OTHER): Payer: Medicare Other | Admitting: Internal Medicine

## 2014-04-21 VITALS — BP 130/60 | HR 76 | Temp 98.4°F | Resp 16 | Ht 60.0 in | Wt 106.8 lb

## 2014-04-21 DIAGNOSIS — E118 Type 2 diabetes mellitus with unspecified complications: Secondary | ICD-10-CM

## 2014-04-21 DIAGNOSIS — D509 Iron deficiency anemia, unspecified: Secondary | ICD-10-CM

## 2014-04-21 DIAGNOSIS — E785 Hyperlipidemia, unspecified: Secondary | ICD-10-CM

## 2014-04-21 DIAGNOSIS — F341 Dysthymic disorder: Secondary | ICD-10-CM

## 2014-04-21 DIAGNOSIS — F0391 Unspecified dementia with behavioral disturbance: Secondary | ICD-10-CM

## 2014-04-21 DIAGNOSIS — R482 Apraxia: Secondary | ICD-10-CM

## 2014-04-21 DIAGNOSIS — F03B18 Unspecified dementia, moderate, with other behavioral disturbance: Secondary | ICD-10-CM

## 2014-04-21 MED ORDER — RISPERIDONE 2 MG PO TABS: 1.0000 mg | ORAL_TABLET | Freq: Every day | ORAL | Status: DC

## 2014-04-21 MED ORDER — CITALOPRAM HYDROBROMIDE 40 MG PO TABS: ORAL_TABLET | ORAL | Status: DC

## 2014-04-21 MED ORDER — INSULIN GLARGINE 100 UNIT/ML SOLOSTAR PEN: PEN_INJECTOR | SUBCUTANEOUS | Status: DC

## 2014-04-21 NOTE — Progress Notes (Addendum)
Subjective:  This chart was scribed for Marcia Sia, MD by Carl Best, Medical Scribe. This patient was seen in Room 12 and the patient's care was started at 11:34 AM.   Patient ID: Marcia Campbell, female    DOB: 05/25/1921, 81 y.o.   MRN: [id number]  HPI HPI Comments: Marcia Campbell is a 81 y.o. female with a history of Dementia and DM who presents to the [facility] with her daughter and primary caretaker for a refill of her Lantus and Risperdal.    Patient Active Problem List   Diagnosis Date Noted  . Anemia, unspecified--- followed by Dr. Bertis Ruddy--- stable without treatment  05/28/2013  . Thrombocytopenia, unspecified----probable lab error  05/28/2013  . Moderate dementia with behavioral disturbance--- at her most recent visit with Dr. Marjory Lies her dementia has progressed to a severe level and there was discussion about palliative care options with transition toward comfort and away from unnecessary testing and treatment  12/12/2012  . 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 Glaucoma 07/25/2011  . Positive PPD, treated many years ago  07/25/2011  . DM (diabetes mellitus)--- stable at her last lab check and doing well without symptoms on Lantus  07/24/2011  . Hyperlipemia--- no last labs  07/24/2011  . Dysthymic disorder 07/24/2011   Her daughter states that over the recent 6 months the patient has been having trouble staying asleep at night and will get up and walk around in the middle of the night 3-4 times a week.  When she asks the patient why she's up she will say that she has to get dressed, oral for another reason that is not true.  Very rarely does she get up to use the bathroom at night.  The patient's anxiety has also worsened. This is long been a problem which responded very well to Celexa prior to the onset of her dementia. Her paranoia became more and more difficult but improved when risperdal started. She now is more anxious and having more paranoid  thinking that she expresses.  She [facility] to the [facility].  She seems to understand my questions about this place.  She had significant orthopedic pain and was evaluated at [facility] orthopedics and at one point placed on Gabapentin by her orthopedist due to a clinical suspicion of RLS. Her neurologist, Dr. Marjory Lies, felt this would not be helpful for her given her level of dementia and discontinued it. She has minor discomfort from time to time from her injury and the coccygeal and lumbar area and for this uses occasional tramadol with success. This does not appear to hamper her regular activity.   Past Surgical History  Procedure Laterality Date  . Bowel resection    . Abdominal hysterectomy    . Breast reduction surgery     Family History  Problem Relation Age of Onset  . Diabetes Sister   . Cancer Daughter   She has outlived all of her friends and all of her relatives that were in her age group and prior to her dementia she expressed to me that she felt little joy in life because of these losses.   Allergies  Allergen Reactions  . Penicillins Itching    Review of Systems  Psychiatric/Behavioral: Positive for sleep disturbance. The patient is nervous/anxious.   No change in fatigability or breathing during activity noted by daughter No edema No change in urinary pattern She has a peak eater and has had trouble maintaining her weight  Objective:  Physical Exam  Constitutional: She is oriented to person, place, and time. She appears well-developed and well-nourished.  She appears in no distress and smiles at times although it is obvious that she has trouble remembering who I am even though I know her well from many past visits  HENT:  Head: Normocephalic and atraumatic.  Eyes: Conjunctivae and EOM are normal. Pupils are equal, round, and reactive to light.  Neck: Normal range of motion. Neck supple.  Cardiovascular: Normal rate.   Pulmonary/Chest:  Effort normal.  Musculoskeletal: Normal range of motion.  Neurological: She is alert and oriented to person, place, and time. No cranial nerve deficit.  Her gait shows some fragility but is otherwise stable She is not oriented to time or place  Psychiatric:  She is devoid of judgment and unable to respond to my questions with anything other than yes or no  Nursing note and vitals reviewed.    BP 130/60 mmHg  Pulse 76  Temp(Src) 98.4 F (36.9 C) (Oral)  Resp 16  Ht 5' (1.524 m)  Wt 106 lb 12.8 oz (48.444 kg)  BMI 20.86 kg/m2  SpO2 98% Assessment & Plan:  Type 2 diabetes mellitus with complication  Hyperlipemia--- no longer on medication  Dysthymic disorder--Celexa was continued although we discussed cutting this dose in half and perhaps discontinuing it at some point  Anemia, iron deficiency--- considered stable at this point  Moderate dementia with behavioral disturbance--- advancing to severe dementia recently It would seem prudent to increase Risperdal to 2 mg to try to provide sustained sleep for 6-8 hours if possible and to see if it decreased her paranoid thinking and her obvious increased level of anxiety. Her daughter is instructed to watch for a bad extrapyramidal response.  Gait apraxia--not completely unstable  Meds ordered this encounter  Medications  . citalopram (CELEXA) 40 MG tablet    Sig: Take 1 tablet by mouth daily.    Dispense:  30 tablet    Refill:  11  . Insulin Glargine (LANTUS SOLOSTAR) 100 UNIT/ML Solostar Pen    Sig: Inject 12 units into the skin at bedtime.    Dispense:  15 mL    Refill:  10  . risperiDONE (RISPERDAL) 2 MG tablet    Sig: Take 0.5 tablets (1 mg total) by mouth at bedtime.    Dispense:  30 tablet    Refill:  11   I will expect a call from her daughter in 3-6 weeks about her response to medication and about the idea of decreasing Celexa to 20 mg.  also I will change her prescriptions to 90 day supplies if she has a positive  response.  I have completed the patient encounter in its entirety as documented by the scribe, with editing by me where necessary. Marcia Campbell P. Merla Richesoolittle, M.D.

## 2014-04-23 ENCOUNTER — Other Ambulatory Visit: Payer: Self-pay

## 2014-04-23 MED ORDER — RISPERIDONE 2 MG PO TABS: 1.0000 mg | ORAL_TABLET | Freq: Every day | ORAL | Status: DC

## 2014-04-24 ENCOUNTER — Telehealth: Payer: Self-pay

## 2014-04-24 NOTE — Telephone Encounter (Signed)
Patients care giver has questions to the dosage and medication prescribed.  She thinks the pharmacy may have made a mistake.  Explain exactly what the doctor ordered. [facility]  Please call   [phone]

## 2014-04-28 ENCOUNTER — Telehealth: Payer: Self-pay

## 2014-04-28 NOTE — Telephone Encounter (Signed)
I spoke with [name] about this and the message was routed to Dr. Merla Richesoolittle. Awaiting his response.

## 2014-04-28 NOTE — Telephone Encounter (Signed)
Pt has a question about her mom's prescription:  risperiDONE (RISPERDAL) 2 MG tablet     Sig: Take 0.5 tablets (1 mg total) by mouth at bedtime.    Dispense: 30 tablet    Refill: 11         Does she take a whole pill or a half? Pt is also having diarrhea and her glucose is going up. [name] states this may be a side effect from the medicine. Your notes below:   Moderate dementia with behavioral disturbance--- advancing to severe dementia recently It would seem prudent to increase Risperdal to 2 mg to try to provide sustained sleep for 6-8 hours if possible and to see if it decreased her paranoid thinking and her obvious increased level of anxiety. Her daughter is instructed to watch for a bad extrapyramidal response.  [name]

## 2014-04-28 NOTE — Telephone Encounter (Signed)
[name] would like to speak with someone regarding her mother's diagnosis and the way the medication is causing her to have diarrhea. Please call [phone] or her cell at [phone]

## 2014-04-28 NOTE — Telephone Encounter (Signed)
My mistake on the rx---she was taking 1mg  at hs and I wanted to increase to 2mg  to be taken bid (so 1/2 x 2mg  bid) If she has been taking 2mg  and she has started with incr sugar and diarrhea then we shoyuld blame the med and she should reduce to her former dose of 1mg (1/2 tab ) hs----when she has resolved the side effects have daughter send message ? about what we might try next

## 2014-04-29 NOTE — Telephone Encounter (Signed)
Spoke with daughter, explained message from Dr. Merla Richesoolittle. She agreed with plan and will call us to let us know the update.

## 2014-04-30 ENCOUNTER — Other Ambulatory Visit: Payer: Self-pay | Admitting: Diagnostic Neuroimaging

## 2014-05-02 ENCOUNTER — Telehealth: Payer: Self-pay

## 2014-05-02 NOTE — Telephone Encounter (Signed)
Patient's caregiver, Cordelia PocheVenice Preston, called to ask for an adjustment or replacement medication for the patient's Risperidone.  She said that the patient has been having daytime and nocturnal incontinence, and she believes it is a side effect of the Risperidone.  She said that it causes the patient discomfort, and she cannot sleep at night due to these issues.  Please advise.  Thank you.  CB#: [phone] (h) or [phone] (c)

## 2014-05-05 NOTE — Telephone Encounter (Signed)
Did we reduce her to old dose after going up and yet she is still incontinent??--if so, d/c med altogether--if not resume old dose first after taking 2 days off meds

## 2014-05-06 NOTE — Telephone Encounter (Signed)
Disc w/ daugh. incont has resolved by doing 1/2 bid(2mg  daily total) GI better But some combativeness when trying to assist her like with clothes or bathing or movement  Plan- decr cital to 20mg  and follow ? Is this due to incr dementia vs other Will follow No need for OV now

## 2014-05-06 NOTE — Telephone Encounter (Signed)
Spoke with pt, advised that it would be a good idea to come in to see Dr. Merla Richesoolittle. We agreed for pt to come in to see Dr. Merla Richesoolittle on Monday at 5pm. She also states the pt is being more combative lately. FYI Dr. Merla Richesoolittle.

## 2014-06-09 ENCOUNTER — Telehealth: Payer: Self-pay

## 2014-06-09 NOTE — Telephone Encounter (Signed)
risperiDONE (RISPERDAL) 2 MG tablet   To be filled on the 26th   Taking 1/2 tablet two times a day and will run out.     citalopram (CELEXA) 40 MG tablet  Decreased to 1/2 tablet in the morning  [phone]

## 2014-06-10 ENCOUNTER — Emergency Department (HOSPITAL_COMMUNITY): Payer: Medicare Other

## 2014-06-10 ENCOUNTER — Encounter (HOSPITAL_COMMUNITY): Payer: Self-pay | Admitting: *Deleted

## 2014-06-10 ENCOUNTER — Emergency Department (HOSPITAL_COMMUNITY)
Admission: EM | Admit: 2014-06-10 | Discharge: 2014-06-11 | Disposition: A | Discharge status: Home / Self Care | Payer: Medicare Other | Attending: Emergency Medicine | Admitting: Emergency Medicine

## 2014-06-10 DIAGNOSIS — R531 Weakness: Secondary | ICD-10-CM | POA: Diagnosis not present

## 2014-06-10 DIAGNOSIS — Z79899 Other long term (current) drug therapy: Secondary | ICD-10-CM | POA: Diagnosis not present

## 2014-06-10 DIAGNOSIS — Z862 Personal history of diseases of the blood and blood-forming organs and certain disorders involving the immune mechanism: Secondary | ICD-10-CM | POA: Insufficient documentation

## 2014-06-10 DIAGNOSIS — R29898 Other symptoms and signs involving the musculoskeletal system: Secondary | ICD-10-CM

## 2014-06-10 DIAGNOSIS — Z794 Long term (current) use of insulin: Secondary | ICD-10-CM | POA: Insufficient documentation

## 2014-06-10 DIAGNOSIS — R269 Unspecified abnormalities of gait and mobility: Secondary | ICD-10-CM | POA: Diagnosis present

## 2014-06-10 DIAGNOSIS — E119 Type 2 diabetes mellitus without complications: Secondary | ICD-10-CM | POA: Diagnosis not present

## 2014-06-10 DIAGNOSIS — F0390 Unspecified dementia without behavioral disturbance: Secondary | ICD-10-CM | POA: Insufficient documentation

## 2014-06-10 DIAGNOSIS — Z880 Allergy status to penicillin: Secondary | ICD-10-CM | POA: Insufficient documentation

## 2014-06-10 LAB — CBC WITH DIFFERENTIAL/PLATELET
Basophils Absolute: 0 10*3/uL (ref 0.0–0.1)
Basophils Relative: 0 % (ref 0–1)
Eosinophils Absolute: 0 10*3/uL (ref 0.0–0.7)
Eosinophils Relative: 1 % (ref 0–5)
HCT: 32.9 % — ABNORMAL LOW (ref 36.0–46.0)
Hemoglobin: 10.6 g/dL — ABNORMAL LOW (ref 12.0–15.0)
Lymphocytes Relative: 33 % (ref 12–46)
Lymphs Abs: 1.8 10*3/uL (ref 0.7–4.0)
MCH: 30.5 pg (ref 26.0–34.0)
MCHC: 32.2 g/dL (ref 30.0–36.0)
MCV: 94.5 fL (ref 78.0–100.0)
Monocytes Absolute: 0.5 10*3/uL (ref 0.1–1.0)
Monocytes Relative: 9 % (ref 3–12)
Neutro Abs: 3.1 10*3/uL (ref 1.7–7.7)
Neutrophils Relative %: 57 % (ref 43–77)
Platelets: 217 10*3/uL (ref 150–400)
RBC: 3.48 MIL/uL — ABNORMAL LOW (ref 3.87–5.11)
RDW: 13 % (ref 11.5–15.5)
WBC: 5.4 10*3/uL (ref 4.0–10.5)

## 2014-06-10 LAB — URINALYSIS, ROUTINE W REFLEX MICROSCOPIC
Bilirubin Urine: NEGATIVE
Glucose, UA: NEGATIVE mg/dL
Hgb urine dipstick: NEGATIVE
Ketones, ur: NEGATIVE mg/dL
Leukocytes, UA: NEGATIVE
Nitrite: NEGATIVE
Protein, ur: NEGATIVE mg/dL
Specific Gravity, Urine: 1.01 (ref 1.005–1.030)
Urobilinogen, UA: 1 mg/dL (ref 0.0–1.0)
pH: 6.5 (ref 5.0–8.0)

## 2014-06-10 LAB — COMPREHENSIVE METABOLIC PANEL
ALT: 19 U/L (ref 0–35)
AST: 29 U/L (ref 0–37)
Albumin: 3.7 g/dL (ref 3.5–5.2)
Alkaline Phosphatase: 49 U/L (ref 39–117)
Anion gap: 10 (ref 5–15)
BUN: 18 mg/dL (ref 6–23)
CO2: 26 mmol/L (ref 19–32)
Calcium: 9.6 mg/dL (ref 8.4–10.5)
Chloride: 104 mmol/L (ref 96–112)
Creatinine, Ser: 1.01 mg/dL (ref 0.50–1.10)
GFR calc Af Amer: 55 mL/min — ABNORMAL LOW (ref >90)
GFR calc non Af Amer: 48 mL/min — ABNORMAL LOW (ref >90)
Glucose, Bld: 102 mg/dL — ABNORMAL HIGH (ref 70–99)
Potassium: 4.1 mmol/L (ref 3.5–5.1)
Sodium: 140 mmol/L (ref 135–145)
Total Bilirubin: 1.1 mg/dL (ref 0.3–1.2)
Total Protein: 6.8 g/dL (ref 6.0–8.3)

## 2014-06-10 LAB — TROPONIN I: Troponin I: <0.03 ng/mL (ref <0.031)

## 2014-06-10 MED ORDER — RISPERIDONE 2 MG PO TABS: 1.0000 mg | ORAL_TABLET | Freq: Two times a day (BID) | ORAL | Status: DC

## 2014-06-10 MED ORDER — SODIUM CHLORIDE 0.9 % IV BOLUS (SEPSIS)
500.0000 mL | Freq: Once | INTRAVENOUS | Status: AC
  Administered 2014-06-10: 500 mL via INTRAVENOUS

## 2014-06-10 NOTE — ED Notes (Signed)
Pt ambulated in hallway per norm per daughter, pt held RN hand but was not holding pt up.

## 2014-06-10 NOTE — Telephone Encounter (Signed)
Spoke with daughter. She said she is confused by the citalopram--you told her that Annaliza is supposed to be taking 1/2 tab daily, but the rx says one daily. I wanted to clarify this with you.

## 2014-06-10 NOTE — ED Notes (Signed)
Bed: WA06 Expected date: 06/10/14 Expected time: 8:51 PM Means of arrival: Ambulance Comments: 81 yo F  Dementia, abnormal gait

## 2014-06-10 NOTE — ED Notes (Signed)
Patient not in room I will collect labs when they return.

## 2014-06-10 NOTE — ED Notes (Signed)
Patient transported to CT 

## 2014-06-10 NOTE — ED Notes (Signed)
Daughter states pt started having trouble ambulating around 1645 today,states she usually walks slowly but by self, states she did order the pt a rolling walker to be delivered Friday, denies any other symptoms, denies urinary symptoms, denies recent sickness for pt, pt has dementia and does not speak a lot. Able to move all extremities when told.

## 2014-06-10 NOTE — Telephone Encounter (Signed)
Definitely should be 1/2 tab---can call in 20mg  size 90 day supply--in 1-2 months we may consider 10mg 

## 2014-06-10 NOTE — ED Notes (Signed)
Per EMS pt from home w/ daughter, woke up around 5 am, goes to adult daycare during day, was fine this morning, usually walks slow, when daughter went to pick her up her gait was more unsteady, steps are smaller, when got home, pts legs seemed like they gave out, pt has dementia.

## 2014-06-11 ENCOUNTER — Other Ambulatory Visit: Payer: Self-pay | Admitting: Radiology

## 2014-06-11 MED ORDER — CITALOPRAM HYDROBROMIDE 20 MG PO TABS: 20.0000 mg | ORAL_TABLET | Freq: Every day | ORAL | Status: DC

## 2014-06-11 NOTE — Discharge Instructions (Signed)
Return here as needed.  Follow-up with your primary care doctor °

## 2014-06-11 NOTE — Telephone Encounter (Signed)
Daughter notified of message.

## 2014-06-11 NOTE — Telephone Encounter (Signed)
lmom to cb. Will send in med.

## 2014-06-11 NOTE — ED Provider Notes (Signed)
CSN: [id number]     Arrival date & time 06/10/14  2102 History   First MD Initiated Contact with Patient 06/10/14 2115     Chief Complaint  Patient presents with  . abnormal gait      (Consider location/radiation/quality/duration/timing/severity/associated sxs/prior Treatment) HPI Patient presents to the emergency department with an issue with ambulation.  This afternoon.  The daughter states that when she got home from her adult daycare she had difficulty walking, which she normally has no issues.  It was not reported to her that she had any issues walking during the day today.  The patient reports no pain or issues to the daughter.  The daughter states the patient has had no nausea, vomiting, chest pain, shortness of breath, weakness, dizziness, headache, or syncopal episodes.  The patient is demented and not able to give me many answers.  The daughter says she appears to be in her normal state other than she was unable to walk earlier this afternoon and had to have assistance.  She normally walks slowly but with no assistance.  The daughter did states that she bought a walker with wheels for the patient, even before this episode and states there was some concern about her gait.  In general, but she never had any trouble walking Past Medical History  Diagnosis Date  . Dementia   . Diabetes mellitus   . Hypercholesteremia   . Anemia, unspecified 05/28/2013   Past Surgical History  Procedure Laterality Date  . Bowel resection    . Abdominal hysterectomy    . Breast reduction surgery     Family History  Problem Relation Age of Onset  . Diabetes Sister   . Cancer Daughter    History  Substance Use Topics  . Smoking status: Never Smoker   . Smokeless tobacco: Never Used  . Alcohol Use: No   OB History    No data available     Review of Systems Level V caveat applies due to dementia   Allergies  Penicillins  Home Medications   Prior to Admission medications   Medication  Sig Start Date End Date Taking? Authorizing Provider  citalopram (CELEXA) 40 MG tablet Take 1 tablet by mouth daily. Patient taking differently: Take 20 mg by mouth daily.  04/21/14  Yes Tonye Pearsonobert P Doolittle, MD  donepezil (ARICEPT) 10 MG tablet TAKE 1 TABLET BY MOUTH EVERY NIGHT AT BEDTIME 04/30/14  Yes Suanne MarkerVikram R Penumalli, MD  econazole nitrate 1 % cream Apply 1 application topically daily.  01/05/14  Yes Historical Provider, MD  Insulin Glargine (LANTUS SOLOSTAR) 100 UNIT/ML Solostar Pen Inject 12 units into the skin at bedtime. 04/21/14  Yes Tonye Pearsonobert P Doolittle, MD  risperiDONE (RISPERDAL) 2 MG tablet Take 0.5 tablets (1 mg total) by mouth 2 (two) times daily. 06/10/14  Yes Tonye Pearsonobert P Doolittle, MD  traMADol (ULTRAM) 50 MG tablet Take 50 mg by mouth every 6 (six) hours as needed for pain.   Yes Historical Provider, MD   BP 142/71 mmHg  Pulse 63  Temp(Src) 97.9 F (36.6 C) (Oral)  Resp 16  SpO2 97% Physical Exam  Constitutional: She appears well-developed and well-nourished. No distress.  HENT:  Head: Normocephalic and atraumatic.  Mouth/Throat: Oropharynx is clear and moist.  Eyes: EOM are normal. Pupils are equal, round, and reactive to light.  Neck: Normal range of motion. Neck supple.  Cardiovascular: Normal rate, regular rhythm and normal heart sounds.  Exam reveals no gallop and no friction rub.  No murmur heard. Pulmonary/Chest: Effort normal and breath sounds normal. No respiratory distress.  Neurological: She is alert. She has normal strength. No sensory deficit. She exhibits normal muscle tone. Coordination and gait normal.  Skin: Skin is warm and dry. No rash noted. No erythema.  Nursing note and vitals reviewed.   ED Course  Procedures (including critical care time) Labs Review Labs Reviewed  COMPREHENSIVE METABOLIC PANEL - Abnormal; Notable for the following:    Glucose, Bld 102 (*)    GFR calc non Af Amer 48 (*)    GFR calc Af Amer 55 (*)    All other components within  normal limits  CBC WITH DIFFERENTIAL/PLATELET - Abnormal; Notable for the following:    RBC 3.48 (*)    Hemoglobin 10.6 (*)    HCT 32.9 (*)    All other components within normal limits  URINE CULTURE  URINALYSIS, ROUTINE W REFLEX MICROSCOPIC  TROPONIN I    Imaging Review Dg Pelvis 1-2 Views  06/10/2014   CLINICAL DATA:  Leg weakness and difficulty walking onset today. Dementia.  EXAM: PELVIS - 1-2 VIEW  COMPARISON:  [place] 08/17/2011  FINDINGS: There is no evidence of pelvic fracture or diastasis. No pelvic bone lesions are seen. Mild degenerative changes in the lower lumbar spine and hips.  IMPRESSION: No acute bony abnormalities.  Mild degenerative changes in the hips.   Electronically Signed   By: Burman Nieves M.D.   On: 06/10/2014 23:22   Ct Head Wo Contrast  06/10/2014   CLINICAL DATA:  Difficulty ambulating starting at 16:45 today. History of dementia. Weakness.  EXAM: CT HEAD WITHOUT CONTRAST  TECHNIQUE: Contiguous axial images were obtained from the base of the skull through the vertex without intravenous contrast.  COMPARISON:  04/10/2012  FINDINGS: Diffuse cerebral atrophy. Mild ventricular dilatation consistent with central atrophy. Patchy low-attenuation changes throughout the deep white matter consistent with small vessel ischemia. No mass effect or midline shift. No abnormal extra-axial fluid collections. Gray-white matter junctions are distinct. Basal cisterns are not effaced. No evidence of acute intracranial hemorrhage. No depressed skull fractures. Visualized paranasal sinuses and mastoid air cells are not opacified.  IMPRESSION: No acute intracranial abnormalities. Chronic atrophy and small vessel ischemic changes.   Electronically Signed   By: Burman Nieves M.D.   On: 06/10/2014 22:16     EKG Interpretation   Date/Time:  Wednesday June 10 2014 21:17:17 EDT Ventricular Rate:  67 PR Interval:  154 QRS Duration: 74 QT Interval:  438 QTC Calculation: 462 R Axis:    -55 Text Interpretation:  Sinus rhythm Inferior infarct, old Consider anterior  infarct Baseline wander in lead(s) V6 No change from prior Confirmed by  [facility]  MD, BLAIR ([id number]) on 06/10/2014 10:00:37 PM     The patient is able to ambulate without difficulty.  Her laboratory testing and x-rays did not show any abnormalities.  Patient was seen by Dr. Gordy Levan, who also agrees with this plan.  The patient will be asked to follow-up with her primary care doctor.  She is getting a walker on Friday for assistance with walking.  The daughter is advised return here for any worsening in her condition    Charlestine Night, PA-C 06/11/14 0020  Elwin Mocha, MD 06/11/14 [phone]

## 2014-06-12 LAB — URINE CULTURE
Colony Count: NO GROWTH
Culture: NO GROWTH

## 2014-06-29 ENCOUNTER — Telehealth: Payer: Self-pay

## 2014-06-29 DIAGNOSIS — R195 Other fecal abnormalities: Secondary | ICD-10-CM

## 2014-06-29 MED ORDER — LOPERAMIDE HCL 2 MG PO CAPS: 2.0000 mg | ORAL_CAPSULE | ORAL | Status: AC | PRN

## 2014-06-29 NOTE — Telephone Encounter (Signed)
Please advise 

## 2014-06-29 NOTE — Telephone Encounter (Signed)
Pt's daughter [name]([name]) called and would like us to fax over an order for her mother. Her mom is having trouble with diarrhea. She would like an order for Imodium. The fax # is [phone] make it To [facility] at the [facility]. Please advise [phone]

## 2014-06-29 NOTE — Telephone Encounter (Signed)
Order sent to pharmacy. Please fax order per instructions below.

## 2014-06-30 NOTE — Telephone Encounter (Signed)
Order faxed.

## 2014-06-30 NOTE — Telephone Encounter (Signed)
loperamide (IMODIUM) 2 MG capsule [[id number]][[id number]]     Order Details    Dose: 2 mg Route: Oral Frequency: As needed for diarrhea or loose stools   Dispense Quantity:  30 capsule Refills:  0 Fills Remaining:  0          Sig: Take 1 capsule (2 mg total) by mouth as needed for diarrhea or loose stools. Not to exceed 8 mg/day         Written Date:  06/29/14 Expiration Date:  06/29/15     Start Date:  06/29/14 End Date:  --     Ordering Provider:  -- Authorizing Provider:  Dorna LeitzNicole Bush V, PA-C Ordering User:  Dorna LeitzNicole Bush V, PA-C

## 2014-07-07 ENCOUNTER — Other Ambulatory Visit: Payer: Self-pay | Admitting: Diagnostic Neuroimaging

## 2014-07-15 ENCOUNTER — Telehealth: Payer: Self-pay

## 2014-07-15 NOTE — Telephone Encounter (Signed)
Victorino DikeJennifer from [facility] is calling to request patients current medications and a hmp. Please call or fax. Phone: [phone] Fax: [phone]

## 2014-07-16 NOTE — Telephone Encounter (Signed)
Left message for pt to call back  °

## 2014-07-16 NOTE — Telephone Encounter (Signed)
Faxed

## 2014-08-27 ENCOUNTER — Telehealth: Payer: Self-pay

## 2014-08-27 ENCOUNTER — Observation Stay (HOSPITAL_COMMUNITY)
Admission: EM | Admit: 2014-08-27 | Discharge: 2014-08-30 | Disposition: A | Discharge status: Skilled Nursing Facility | Payer: Medicare Other | Attending: Family Medicine | Admitting: Family Medicine

## 2014-08-27 ENCOUNTER — Ambulatory Visit (INDEPENDENT_AMBULATORY_CARE_PROVIDER_SITE_OTHER): Payer: Medicare Other | Admitting: Family Medicine

## 2014-08-27 ENCOUNTER — Emergency Department (HOSPITAL_COMMUNITY): Payer: Medicare Other

## 2014-08-27 ENCOUNTER — Encounter (HOSPITAL_COMMUNITY): Payer: Self-pay | Admitting: Emergency Medicine

## 2014-08-27 VITALS — BP 116/74 | HR 82 | Temp 98.3°F | Resp 16

## 2014-08-27 DIAGNOSIS — D649 Anemia, unspecified: Secondary | ICD-10-CM | POA: Insufficient documentation

## 2014-08-27 DIAGNOSIS — D509 Iron deficiency anemia, unspecified: Secondary | ICD-10-CM | POA: Insufficient documentation

## 2014-08-27 DIAGNOSIS — F03B18 Unspecified dementia, moderate, with other behavioral disturbance: Secondary | ICD-10-CM

## 2014-08-27 DIAGNOSIS — R531 Weakness: Principal | ICD-10-CM

## 2014-08-27 DIAGNOSIS — Z794 Long term (current) use of insulin: Secondary | ICD-10-CM | POA: Diagnosis not present

## 2014-08-27 DIAGNOSIS — D696 Thrombocytopenia, unspecified: Secondary | ICD-10-CM | POA: Diagnosis not present

## 2014-08-27 DIAGNOSIS — E785 Hyperlipidemia, unspecified: Secondary | ICD-10-CM | POA: Insufficient documentation

## 2014-08-27 DIAGNOSIS — Z880 Allergy status to penicillin: Secondary | ICD-10-CM | POA: Diagnosis not present

## 2014-08-27 DIAGNOSIS — E118 Type 2 diabetes mellitus with unspecified complications: Secondary | ICD-10-CM

## 2014-08-27 DIAGNOSIS — F0391 Unspecified dementia with behavioral disturbance: Secondary | ICD-10-CM

## 2014-08-27 DIAGNOSIS — Z7982 Long term (current) use of aspirin: Secondary | ICD-10-CM | POA: Diagnosis not present

## 2014-08-27 DIAGNOSIS — G308 Other Alzheimer's disease: Secondary | ICD-10-CM | POA: Diagnosis not present

## 2014-08-27 DIAGNOSIS — R7611 Nonspecific reaction to tuberculin skin test without active tuberculosis: Secondary | ICD-10-CM

## 2014-08-27 DIAGNOSIS — K5790 Diverticulosis of intestine, part unspecified, without perforation or abscess without bleeding: Secondary | ICD-10-CM | POA: Diagnosis not present

## 2014-08-27 DIAGNOSIS — F0281 Dementia in other diseases classified elsewhere with behavioral disturbance: Secondary | ICD-10-CM | POA: Diagnosis not present

## 2014-08-27 DIAGNOSIS — R29898 Other symptoms and signs involving the musculoskeletal system: Secondary | ICD-10-CM | POA: Diagnosis not present

## 2014-08-27 DIAGNOSIS — E119 Type 2 diabetes mellitus without complications: Secondary | ICD-10-CM | POA: Insufficient documentation

## 2014-08-27 DIAGNOSIS — F341 Dysthymic disorder: Secondary | ICD-10-CM | POA: Insufficient documentation

## 2014-08-27 DIAGNOSIS — H409 Unspecified glaucoma: Secondary | ICD-10-CM | POA: Insufficient documentation

## 2014-08-27 DIAGNOSIS — F0390 Unspecified dementia without behavioral disturbance: Secondary | ICD-10-CM | POA: Insufficient documentation

## 2014-08-27 DIAGNOSIS — R482 Apraxia: Secondary | ICD-10-CM

## 2014-08-27 LAB — POCT CBC
Granulocyte percent: 70.8 %G (ref 37–80)
HCT, POC: 32.8 % — AB (ref 37.7–47.9)
HEMOGLOBIN: 10.5 g/dL — AB (ref 12.2–16.2)
Lymph, poc: 1.4 (ref 0.6–3.4)
MCH: 29.9 pg (ref 27–31.2)
MCHC: 31.9 g/dL (ref 31.8–35.4)
MCV: 93.5 fL (ref 80–97)
MID (cbc): 0.4 (ref 0–0.9)
MPV: 7.5 fL (ref 0–99.8)
POC Granulocyte: 4.2 (ref 2–6.9)
POC LYMPH PERCENT: 23.2 %L (ref 10–50)
POC MID %: 6 %M (ref 0–12)
Platelet Count, POC: 239 10*3/uL (ref 142–424)
RBC: 3.5 M/uL — AB (ref 4.04–5.48)
RDW, POC: 14.4 %
WBC: 6 10*3/uL (ref 4.6–10.2)

## 2014-08-27 LAB — COMPREHENSIVE METABOLIC PANEL
ALBUMIN: 3.4 g/dL — AB (ref 3.5–5.0)
ALT: 23 U/L (ref 14–54)
AST: 29 U/L (ref 15–41)
Alkaline Phosphatase: 52 U/L (ref 38–126)
Anion gap: 8 (ref 5–15)
BILIRUBIN TOTAL: 0.9 mg/dL (ref 0.3–1.2)
BUN: 13 mg/dL (ref 6–20)
CHLORIDE: 107 mmol/L (ref 101–111)
CO2: 26 mmol/L (ref 22–32)
Calcium: 9.3 mg/dL (ref 8.9–10.3)
Creatinine, Ser: 1.03 mg/dL — ABNORMAL HIGH (ref 0.44–1.00)
GFR calc Af Amer: 54 mL/min — ABNORMAL LOW (ref >60)
GFR, EST NON AFRICAN AMERICAN: 46 mL/min — AB (ref >60)
Glucose, Bld: 87 mg/dL (ref 65–99)
POTASSIUM: 4.1 mmol/L (ref 3.5–5.1)
Sodium: 141 mmol/L (ref 135–145)
Total Protein: 6.6 g/dL (ref 6.5–8.1)

## 2014-08-27 LAB — CBC
HEMATOCRIT: 31.1 % — AB (ref 36.0–46.0)
Hemoglobin: 10.3 g/dL — ABNORMAL LOW (ref 12.0–15.0)
MCH: 31 pg (ref 26.0–34.0)
MCHC: 33.1 g/dL (ref 30.0–36.0)
MCV: 93.7 fL (ref 78.0–100.0)
PLATELETS: 206 10*3/uL (ref 150–400)
RBC: 3.32 MIL/uL — AB (ref 3.87–5.11)
RDW: 13.5 % (ref 11.5–15.5)
WBC: 5.4 10*3/uL (ref 4.0–10.5)

## 2014-08-27 LAB — DIFFERENTIAL
BASOS PCT: 0 % (ref 0–1)
Basophils Absolute: 0 10*3/uL (ref 0.0–0.1)
Eosinophils Absolute: 0 10*3/uL (ref 0.0–0.7)
Eosinophils Relative: 0 % (ref 0–5)
Lymphocytes Relative: 25 % (ref 12–46)
Lymphs Abs: 1.4 10*3/uL (ref 0.7–4.0)
MONO ABS: 0.4 10*3/uL (ref 0.1–1.0)
Monocytes Relative: 7 % (ref 3–12)
NEUTROS ABS: 3.7 10*3/uL (ref 1.7–7.7)
NEUTROS PCT: 68 % (ref 43–77)

## 2014-08-27 LAB — URINALYSIS, ROUTINE W REFLEX MICROSCOPIC
Bilirubin Urine: NEGATIVE
GLUCOSE, UA: NEGATIVE mg/dL
Hgb urine dipstick: NEGATIVE
Ketones, ur: NEGATIVE mg/dL
LEUKOCYTES UA: NEGATIVE
Nitrite: NEGATIVE
PH: 7.5 (ref 5.0–8.0)
Protein, ur: NEGATIVE mg/dL
Specific Gravity, Urine: 1.01 (ref 1.005–1.030)
Urobilinogen, UA: 0.2 mg/dL (ref 0.0–1.0)

## 2014-08-27 LAB — I-STAT CHEM 8, ED
BUN: 18 mg/dL (ref 6–20)
Calcium, Ion: 1.2 mmol/L (ref 1.13–1.30)
Chloride: 107 mmol/L (ref 101–111)
Creatinine, Ser: 1 mg/dL (ref 0.44–1.00)
Glucose, Bld: 92 mg/dL (ref 65–99)
HEMATOCRIT: 33 % — AB (ref 36.0–46.0)
HEMOGLOBIN: 11.2 g/dL — AB (ref 12.0–15.0)
Potassium: 4 mmol/L (ref 3.5–5.1)
Sodium: 141 mmol/L (ref 135–145)
TCO2: 23 mmol/L (ref 0–100)

## 2014-08-27 LAB — RAPID URINE DRUG SCREEN, HOSP PERFORMED
Amphetamines: NOT DETECTED
Barbiturates: NOT DETECTED
Benzodiazepines: NOT DETECTED
COCAINE: NOT DETECTED
OPIATES: NOT DETECTED
Tetrahydrocannabinol: NOT DETECTED

## 2014-08-27 LAB — I-STAT TROPONIN, ED: TROPONIN I, POC: 0.01 ng/mL (ref 0.00–0.08)

## 2014-08-27 LAB — ETHANOL: Alcohol, Ethyl (B): <5 mg/dL (ref <5)

## 2014-08-27 LAB — GLUCOSE, CAPILLARY: GLUCOSE-CAPILLARY: 78 mg/dL (ref 65–99)

## 2014-08-27 LAB — APTT: APTT: 30 s (ref 24–37)

## 2014-08-27 LAB — CBG MONITORING, ED: Glucose-Capillary: 80 mg/dL (ref 65–99)

## 2014-08-27 LAB — PROTIME-INR
INR: 0.98 (ref 0.00–1.49)
PROTHROMBIN TIME: 13.2 s (ref 11.6–15.2)

## 2014-08-27 LAB — GLUCOSE, POCT (MANUAL RESULT ENTRY): POC GLUCOSE: 108 mg/dL — AB (ref 70–99)

## 2014-08-27 MED ORDER — PNEUMOCOCCAL VAC POLYVALENT 25 MCG/0.5ML IJ INJ
0.5000 mL | INJECTION | INTRAMUSCULAR | Status: DC
Start: 2014-08-28 — End: 2014-08-30
  Filled 2014-08-27: qty 0.5

## 2014-08-27 MED ORDER — DONEPEZIL HCL 10 MG PO TABS
10.0000 mg | ORAL_TABLET | Freq: Every day | ORAL | Status: DC
  Administered 2014-08-28 – 2014-08-29 (×2): 10 mg via ORAL
  Filled 2014-08-27 (×2): qty 1

## 2014-08-27 MED ORDER — ENOXAPARIN SODIUM 30 MG/0.3ML ~~LOC~~ SOLN
30.0000 mg | SUBCUTANEOUS | Status: DC
  Administered 2014-08-27 – 2014-08-29 (×3): 30 mg via SUBCUTANEOUS
  Filled 2014-08-27 (×3): qty 0.3

## 2014-08-27 MED ORDER — CITALOPRAM HYDROBROMIDE 10 MG PO TABS
10.0000 mg | ORAL_TABLET | Freq: Every day | ORAL | Status: DC
  Administered 2014-08-28 – 2014-08-30 (×3): 10 mg via ORAL
  Filled 2014-08-27 (×3): qty 1

## 2014-08-27 MED ORDER — SODIUM CHLORIDE 0.9 % IV SOLN
INTRAVENOUS | Status: DC
  Administered 2014-08-27: 20:00:00 via INTRAVENOUS

## 2014-08-27 MED ORDER — ACETAMINOPHEN 325 MG PO TABS
650.0000 mg | ORAL_TABLET | Freq: Four times a day (QID) | ORAL | Status: DC | PRN
  Administered 2014-08-29 – 2014-08-30 (×2): 650 mg via ORAL
  Filled 2014-08-27 (×3): qty 2

## 2014-08-27 MED ORDER — HALOPERIDOL LACTATE 5 MG/ML IJ SOLN: 0.5000 mg | Freq: Once | INTRAMUSCULAR | Status: DC

## 2014-08-27 MED ORDER — INSULIN ASPART 100 UNIT/ML ~~LOC~~ SOLN
0.0000 [IU] | Freq: Three times a day (TID) | SUBCUTANEOUS | Status: DC
  Administered 2014-08-28 – 2014-08-30 (×4): 1 [IU] via SUBCUTANEOUS

## 2014-08-27 MED ORDER — RISPERIDONE 0.5 MG PO TABS: 1.0000 mg | ORAL_TABLET | Freq: Two times a day (BID) | ORAL | Status: DC

## 2014-08-27 NOTE — Progress Notes (Addendum)
Subjective:  This chart was scribed for Norberto Sorenson, MD by Charline Bills, ED Scribe. The patient was seen in room 3. Patient's care was started at 1:13 PM.    Patient ID: Marcia Campbell, female    DOB: 1921/11/26, 81 y.o.   MRN: [id number]  Chief Complaint  Patient presents with  . Extremity Weakness    Started this morning while at the daycare center. Pt. is starting to lean to her right side. Weakness in knees.    HPI HPI Comments: Marcia Campbell is a 81 y.o. female who [facility] the [facility]. Pt called earlier today for acute onset of weakness and leaning toward the right side. Pt is in adult day care at [facility] and has [facility]. Pt was last seen by a physician 10 weeks ago in ER also for weakness. No abnormalities were found on exam or head CT. Pt's history is very difficult due to dementia. EKG showed old infarct and wondering baseline. Pt was ordered to follow-up with PCP which she did not do. Urine culture from cath was negative. Negative troponin. Chronic anemia is stable. Developing chronic renal failure stage 3 due to age and Type II DM.   Pt's daughter states that pt was at adult health earlier today when she received a call around 10:45 AM for rapidly worsening acute onset of bilateral lower extremity weakness. Daughter also reports acute onset of leaning to the right while sitting and with walking as well as a tremor. Daughter initially noticed leaning earlier this week and the RN at adult care noticed it again today. Pt's daughter also reports that pt usually does not extend her legs with ambulating for a little over a year now. Pt does not use a walker at home but occasionally uses a rollator. Pt reports that she feels sleepy. She reports eating after breakfast this today but does not recall what she ate; normal according to daughter. Pt is able to urinate in the restroom but she also wears Depends. Daughter denies any urinary  symptoms or constipation; last BM was today. Pt's daughter also denies change in interaction, mental status, ability to walk. No h/o TIA.   Diabetes  Pt is currently on insulin for Type II Diabetes. Her daughter checks her blood glucose every morning after a bowl of oatmeal. Pt's blood glucose this morning was 75 which is below her average readings of 84-88.  Past Medical History  Diagnosis Date  . Dementia   . Diabetes mellitus   . Hypercholesteremia   . Anemia, unspecified 05/28/2013   Current Outpatient Prescriptions on File Prior to Visit  Medication Sig Dispense Refill  . citalopram (CELEXA) 20 MG tablet Take 1 tablet (20 mg total) by mouth daily. Take 1/2 tablet daily. 90 tablet 3  . citalopram (CELEXA) 40 MG tablet Take 1 tablet by mouth daily. (Patient taking differently: Take 20 mg by mouth daily. ) 30 tablet 11  . donepezil (ARICEPT) 10 MG tablet TAKE 1 TABLET BY MOUTH EVERY NIGHT AT BEDTIME 90 tablet 2  . econazole nitrate 1 % cream Apply 1 application topically daily.   3  . Insulin Glargine (LANTUS SOLOSTAR) 100 UNIT/ML Solostar Pen Inject 12 units into the skin at bedtime. 15 mL 10  . loperamide (IMODIUM) 2 MG capsule Take 1 capsule (2 mg total) by mouth as needed for diarrhea or loose stools. Not to exceed 8 mg/day 30 capsule 0  . risperiDONE (RISPERDAL) 2  MG tablet Take 0.5 tablets (1 mg total) by mouth 2 (two) times daily. 90 tablet 3  . traMADol (ULTRAM) 50 MG tablet Take 50 mg by mouth every 6 (six) hours as needed for pain.     No current facility-administered medications on file prior to visit.   Allergies  Allergen Reactions  . Penicillins Itching   Review of Systems  Constitutional: Positive for fatigue.  Gastrointestinal: Negative for constipation.  Genitourinary: Negative.   Neurological: Positive for tremors and weakness.   BP 116/74 mmHg  Pulse 82  Temp(Src) 98.3 F (36.8 C) (Oral)  Resp 16  SpO2 97%    Objective:   Physical Exam  Constitutional:  She is oriented to person, place, and time. She appears well-developed and well-nourished. No distress.  HENT:  Head: Normocephalic and atraumatic.  Eyes: Conjunctivae and EOM are normal.  Neck: Neck supple. No tracheal deviation present.  Cardiovascular: Normal rate.   Pulmonary/Chest: Effort normal. No respiratory distress.  Musculoskeletal: Normal range of motion.  Listing to the R. Unable to follow commands. No pronator drift nor any notable changes in cranial nevers but exam is difficult to complete as she was unable to follow instructions without repeated and extensive coaching from daughter. 5 hours ago pt was ambulatory normally, now she can not stand independently due to R lower weakness vs positive Romberg's.   Neurological: She is alert and oriented to person, place, and time.  Skin: Skin is warm and dry.  Psychiatric: She has a normal mood and affect. Her behavior is normal.  Nursing note and vitals reviewed.     Results for orders placed or performed in visit on 08/27/14  POCT CBC  Result Value Ref Range   WBC 6.0 4.6 - 10.2 K/uL   Lymph, poc 1.4 0.6 - 3.4   POC LYMPH PERCENT 23.2 10 - 50 %L   MID (cbc) 0.4 0 - 0.9   POC MID % 6.0 0 - 12 %M   POC Granulocyte 4.2 2 - 6.9   Granulocyte percent 70.8 37 - 80 %G   RBC 3.50 (A) 4.04 - 5.48 M/uL   Hemoglobin 10.5 (A) 12.2 - 16.2 g/dL   HCT, POC 51.8 (A) 98.4 - 47.9 %   MCV 93.5 80 - 97 fL   MCH, POC 29.9 27 - 31.2 pg   MCHC 31.9 31.8 - 35.4 g/dL   RDW, POC 21.0 %   Platelet Count, POC 239 142 - 424 K/uL   MPV 7.5 0 - 99.8 fL  POCT glucose (manual entry)  Result Value Ref Range   POC Glucose 108 (A) 70 - 99 mg/dl    .Assessment & Plan:   1. Weakness generalized   2. Moderate dementia with behavioral disturbance   3. Type 2 diabetes mellitus with complication   4. Gait apraxia   Transferred to ER by EMS - concerned pt could have had a stroke but she has been now over 3 hrs since last known normal and not sure when  exactly pt developed sxs since she was at adult daycare - her daughter brought her here after nurse called but duaghter has no idea how long the staff noticed changes - she seemed to be at baseline this monring. I am concerned that pt's daughter appears to be in denial about her mother's condition - see last neuro note where it was discussed that pt had progressed to severe with behavioral disturbances and discussed trying to minimize meds, interventions, hospitalizations, etc  Orders Placed This Encounter  Procedures  . POCT CBC  . POCT glucose (manual entry)     I personally performed the services described in this documentation, which was scribed in my presence. The recorded information has been reviewed and considered, and addended by me as needed.  Norberto Sorenson, MD MPH

## 2014-08-27 NOTE — Telephone Encounter (Signed)
Mom is at the day care center.  Nurse states she is experiencing weakness, leaning to the side.   Does she need to come in TODAY?  [phone]  [name]  [phone]  cell

## 2014-08-27 NOTE — Discharge Summary (Signed)
Family Medicine Teaching [facility] Discharge Summary  Patient name: Marcia Campbell Medical record number: [id number] Date of birth: 1921-03-23 Age: 81 y.o. 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 Respiratory: CTAB without wheezing, rhonchi, or crackles noted. Non-labored. Good air movement. Abdomen: soft, NTND, +BS Extremities: Non-tender, no edema, no deformities Skin: warm, dry, intact without ulcerations Neuro: Stable Oriented to person. Not oriented to place, time, or situation. Speech clear. Facial movements appears symmetric. Tongue midline with fasciculations noted. Moves all extremities spontaneously. Continues to have rigidity/tightness in LE and UEs. No cog-wheeling noted. Downgoing babinski bilaterally.   Brief Hospital Course:  Marcia Campbell is a 81 y/o with a PMHx for dementia presenting with acute bilateral lower extremity weakness and noted to be slouching more to the right by the RN at her adult daycare. She was last known well >4hrs prior to her presentation to her PCP's office and was advised to go to the [facility] ED. In the ED, a code stroke was called but then cancelled per neurology. She returned to her baseline. CT of the head did not reveal any acute changes. There were not acute metabolic changes noted on labs.  Neurology recommended an MRI and to be  placed in observation.   She was placed NPO as she failed a RN bedside swallow evaluation and started on IVFs.  She was noted to have a masked face, bradykinesia, and muscular rigidity on exam. Her home medications (besides tramadol, Lantus, and Risperdal) were continued. UDS was negative, Ethyl alcohol <5, and U/A was not consistent with a UTI. MRI revealed no acute abnormality. The concern was that the patient's symptoms could be secondary to risperdal induced parkinsonism vs worsening alzheimer's dementia. She was started on Sinemet for symptomatic relief of rigidity, with some mild improvements. Neurology supports discontinuation of anti-psychotic with risperdal, limited benefit in behavioral manifestations of alzheimer's dementia, signed off. Prior to discharge to SNF, patient improved mental status to baseline dementia, sitter discontinued >24 hours without problems, tolerating dysphagia 3 soft diet with thin liquids.   Issues for Follow Up:  1. If the patient develops paranoia or behaviors concerning for her safety, could consider Seroquel in this patient. Would avoid Risperdal in this patient.  2. If the patient's bradykinesia and rigidity do not improve in the next few weeks, consider discontinuation of Sinemet 3. Diet - continue Dysphagia 3 / ground meat / thin liquids  Significant Procedures: none  Significant Labs and Imaging:   Recent Labs Lab 08/27/14 1343 08/27/14 1450 08/27/14 1454  WBC 6.0 5.4  --   HGB 10.5* 10.3* 11.2*  HCT 32.8* 31.1* 33.0*  PLT  --  206  --     Recent Labs Lab 08/27/14 1450 08/27/14 1454  NA 141 141  K 4.1 4.0  CL 107 107  CO2 26  --   GLUCOSE 87 92  BUN 13 18  CREATININE 1.03* 1.00  CALCIUM 9.3  --   ALKPHOS 52  --   AST 29  --   ALT 23  --  ALBUMIN 3.4*  --    Poc-Trop - 0.01 Alcohol < 5 UDS - negative UA - negative for infection Hgb A1c 6.8  Head CT w/o contrast IMPRESSION: No acute abnormality. Atrophy and chronic  microvascular ischemic change.  EKG: NSR, HR 71, LAD. T wave inversion in aVR, flattened T wave in V2 and V3. QTc  CXR 6/25 IMPRESSION: No active cardiopulmonary disease.  MRI Brain w/o contrast (6/25) IMPRESSION: Moderate to severely motion degraded examination without acute intracranial process.  Mild bifrontal susceptibility artifact favors motion artifact, less likely sequelae of mineralization.  Involutional changes. At least mild to moderate white matter changes compatible chronic small vessel ischemic disease.  Results/Tests Pending at Time of Discharge: none  Discharge Medications:    Medication List    STOP taking these medications        risperiDONE 2 MG tablet  Commonly known as:  RISPERDAL      TAKE these medications        acetaminophen 325 MG tablet  Commonly known as:  TYLENOL  Take 2 tablets (650 mg total) by mouth every 6 (six) hours as needed for mild pain (temperature >/= 99.5 F).     aspirin 81 MG chewable tablet  Chew 1 tablet (81 mg total) by mouth daily.     carbidopa-levodopa 25-100 MG per tablet  Commonly known as:  SINEMET IR  Take 0.5 tablets by mouth 2 (two) times daily.     citalopram 20 MG tablet  Commonly known as:  CELEXA  Take 1 tablet (20 mg total) by mouth daily. Take 1/2 tablet daily.     donepezil 10 MG tablet  Commonly known as:  ARICEPT  TAKE 1 TABLET BY MOUTH EVERY NIGHT AT BEDTIME     Insulin Glargine 100 UNIT/ML Solostar Pen  Commonly known as:  LANTUS SOLOSTAR  Inject 12 units into the skin at bedtime.     lactose free nutrition Liqd  Take 237 mLs by mouth daily.     loperamide 2 MG capsule  Commonly known as:  IMODIUM  Take 1 capsule (2 mg total) by mouth as needed for diarrhea or loose stools. Not to exceed 8 mg/day     traMADol 50 MG tablet  Commonly known as:  ULTRAM  Take 1 tablet (50 mg total) by mouth every 6 (six) hours as needed.        Discharge Instructions: Please refer to Patient  Instructions section of EMR for full details.  Patient was counseled important signs and symptoms that should prompt return to medical care, changes in medications, dietary instructions, activity restrictions, and follow up appointments.   Follow-Up Appointments:   Arranged from SNF  Smitty Cords, DO 08/30/2014, 10:26 AM PGY-2, [facility]

## 2014-08-27 NOTE — H&P (Signed)
Family Medicine Teaching [facility] Admission History and Physical Service Pager: [phone]  Patient name: Marcia Campbell Medical record number: [id number] Date of birth: Feb 23, 1922 Age: 81 y.o. Gender: female  Primary Care Provider: Tonye Pearson, MD Consultants: Neurology  Code Status: Full per discussion, however daughter (has paperwork with specific requests, will bring in AM).  Chief Complaint: Bilateral weakness   Assessment and Plan: Marcia Campbell is a 81 y.o. female presenting with bilateral weakness. PMH is significant for DM, HLD, dementia, anemia, glaucoma, and dysthymic disorder.   Bilateral weakness: Patient was last known well around 9:15am. She was noted to be leaning towards the Marcia at her adult daycare and was noted to have bilateral weakness around 10:45am, now back to baseline. No acute changes on CT. Vital signs are stable without fever, tachycardia, or tachypnea. No leukocytosis concerning for infection. Unfortunately it is difficult to obtain a history from the patient due to her dementia to ascertain of any possible infectious source, however none noted on exam.  Hemoglobin is stable. CBG 108 on presentation. Patient does not appear dehydrated on exam/labs.  No metabolic process noted in labs. Neurology consulted in ED and suggested MRI and evaluation for other possible underlying processes (metabolic vs infectious). Of note, the patient is on risperidone: given her h/o dementia she is at an increased risk of CVA.  - Place in observation, attending Dr. McDiarmid - Monitor on telemetry overnight - Neurology consulted, appreciate recs - Follow up MRI brain (pre-treat with haldol as pt unable to tolerate MRI during time in ER).  - UDS ordered, not yet obtained  - U/A ordered, not yet obtained - PT/OT/SLP (patient failed RN bedside swallow test)  Diabetes mellitus: Last A1c 6.8 12/2013. On Lantus 12u at home.  - NPO until pt passes bedside swallow  -  Sensitive SSI while NPO, continue to monitor CBGs, hold Lantus for now  Dysthymic disorder: difficult to assess status.  - Continue home Celexa 10mg  daily once passes swallow eval  Iron deficiency anemia  - stable, continue to monitor   Moderate dementia:  progressively worsening per daughter. No specific etiology noted in problem list (TSH nml, CT with chronic small vessel changes, no RPR documented).   As expected, long term memory intact, short term memory and execution are difficult for the patient.  - continue Aricept 10mg  qHS once passes swallow eval - Patient lives with daughter (used to be a Nurse, adult for an adult daycare).   FEN/GI: NPO until SLP, NS at 50cc/hr while NPO (quick to IV saline lock once taking PO).  Prophylaxis: Lovenox 30mg  (CCr<30)  Disposition: place in observation, dispo pending further eval  History of Present Illness: Marcia Campbell is a 81 y.o. female presenting with bilateral weakness and leaning towards the Marcia side. Per her daughter's report, the patient was at her baseline this AM, walking to the [facility] to go to the [facility] (pt with a h/o dementia). Per report from daycare, the patient was attempting to get out of her chair around 9:15am and they noted she had significant difficulty with acute onset of bilateral weakness. They also noted her leaning towards the Marcia side as well. They called the daughter around  10:45 to discuss these findings and noted her blood sugar and vitals were normal at that time. She went to [facility], her PCP, initially, and was then advised to go to the [facility] ED.   The pt's daughter notes no cough, fevers, odd behavior,  or complaints about urination/stooling. Normal appetite. She does note that her mother has appeared more tired over the last 2 weeks and recalls her mother may have been leaning towards the right earlier in the week. Per the patient, no cough, no fever, no SOB, no chills. Difficult history as she  repeats every question and often does not reply. Initially endorses "yes" to dysuria, but later denies this.   At baseline, the patient normally ambulates on her own, however does have a rollator which she sometimes uses in the AM when she's sleepy. She lives with her daughter and had weekends aides. She can bathe, groom, and feed herself her daughter believes, but she does not- either her daughter or the aid does this as it is much quicker.   In the ED, a code stroke was called but then cancelled. She returned to baseline. CT of the head did not reveal any acute changes. Neurology recommended an MRI and to be placed in observation.   Review Of Systems: Per HPI with the following additions: Chronic pain in her coccyx intermittently  Otherwise 12 point review of systems was performed and was unremarkable.  Patient Active Problem List   Diagnosis Date Noted  . Anemia, unspecified 05/28/2013  . Thrombocytopenia, unspecified 05/28/2013  . Moderate dementia with behavioral disturbance 12/12/2012  . 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 Glaucoma 07/25/2011  . Positive PPD, treated 07/25/2011  . Diverticulosis 07/25/2011  . Anemia, iron deficiency 07/25/2011  . Small bowel obstruction 07/25/2011  . DM (diabetes mellitus) 07/24/2011  . Hyperlipemia 07/24/2011  . Dysthymic disorder 07/24/2011   Past Medical History: Past Medical History  Diagnosis Date  . Dementia   . Diabetes mellitus   . Hypercholesteremia   . Anemia, unspecified 05/28/2013   Past Surgical History: Past Surgical History  Procedure Laterality Date  . Bowel resection    . Abdominal hysterectomy    . Breast reduction surgery     Social History: History  Substance Use Topics  . Smoking status: Never Smoker   . Smokeless tobacco: Never Used  . Alcohol Use: No   Additional social history: No alcohol, smoking, or drugs  Please also refer to relevant sections of EMR.  Family History: Family History  Problem Relation Age of  Onset  . Diabetes Sister   . Cancer Daughter    Allergies and Medications: Allergies  Allergen Reactions  . Penicillins Itching   No current facility-administered medications on file prior to encounter.   Current Outpatient Prescriptions on File Prior to Encounter  Medication Sig Dispense Refill  . citalopram (CELEXA) 20 MG tablet Take 1 tablet (20 mg total) by mouth daily. Take 1/2 tablet daily. (Patient taking differently: Take 10 mg by mouth daily. Take 1/2 tablet daily.) 90 tablet 3  . donepezil (ARICEPT) 10 MG tablet TAKE 1 TABLET BY MOUTH EVERY NIGHT AT BEDTIME 90 tablet 2  . Insulin Glargine (LANTUS SOLOSTAR) 100 UNIT/ML Solostar Pen Inject 12 units into the skin at bedtime. 15 mL 10  . risperiDONE (RISPERDAL) 2 MG tablet Take 0.5 tablets (1 mg total) by mouth 2 (two) times daily. 90 tablet 3  . traMADol (ULTRAM) 50 MG tablet Take 50 mg by mouth every 6 (six) hours as needed for pain.    . citalopram (CELEXA) 40 MG tablet Take 1 tablet by mouth daily. (Patient taking differently: Take 20 mg by mouth daily. ) 30 tablet 11  . loperamide (IMODIUM) 2 MG capsule Take 1 capsule (2 mg total) by mouth as  needed for diarrhea or loose stools. Not to exceed 8 mg/day 30 capsule 0   Teal and white packaging.   Objective: BP 148/77 mmHg  Pulse 64  Temp(Src) 98.7 F (37.1 C) (Oral)  Resp 16  SpO2 99% Exam: General: Lying in bed in NAD, alert, repeating questions Eyes: Non-injected conjunctivae  ENTM: Atraumatic. MMM. Oropharynx clear. Moist nasal turbinates Neck: Supple Cardiovascular: RRR, no m/Marcia/g noted. No pitting edema. No JVD Respiratory: Poor effort. No increased WOB. CTAB without wheezing, rhonchi, or crackles noted.  Abdomen: +BS, Soft, non-distended, no tenderness. No rebound or guarding. MSK: No tenderness to palpation. No gross deficits Skin: Well heal midline scar over the abdomen. Neuro: Oriented to part of her name. Not oriented to place, time, or situation. Initially  states she does not have a daughter, but when her daughter talks to her, she can tell me her name. Speech clear. Patient repeats questions asked to her. Able to follow simple commands. PERRL. Cannot participate in EOM testing. Smile symmetric. Tongue and uvula midline. Sensation intact in V1-V3 b/l. Shoulder shrug intact. Moves all extremities. Grip strength symmetric. Brings legs to her abdomen, therefore difficult to assess LE strength but appears symmetric. Some rigidity in movements but no real cog-wheeling noted however difficult to assess in bed. Sensation to light touch intact in the UE and LE b/l. Unable to participate in FNF (will touch nose but cannot go to my finger). Downgoing babinski b/l.   Labs and Imaging: CBC BMET   Recent Labs Lab 08/27/14 1450 08/27/14 1454  WBC 5.4  --   HGB 10.3* 11.2*  HCT 31.1* 33.0*  PLT 206  --     Recent Labs Lab 08/27/14 1450 08/27/14 1454  NA 141 141  K 4.1 4.0  CL 107 107  CO2 26  --   BUN 13 18  CREATININE 1.03* 1.00  GLUCOSE 87 92  CALCIUM 9.3  --      Risk Stratification Labs  TSH    Component Value Date/Time   TSH 4.187 05/16/2013 1647   Hemoglobin A1C    Component Value Date/Time   HGBA1C 6.8 12/25/2013 1418   Lipid Panel     Component Value Date/Time   CHOL 204* 12/18/2012 1116   TRIG 46 12/18/2012 1116   HDL 76 12/18/2012 1116   CHOLHDL 2.7 12/18/2012 1116   VLDL 9 12/18/2012 1116   LDLCALC 119* 12/18/2012 1116    EKG: NSR, HR 71, LAD. T wave inversion in aVR, flattened T wave in V2 and V3. QTc  Ct Head (brain) Wo Contrast  08/27/2014   CLINICAL DATA:  The patient is leaning to the right today. Initial encounter.  EXAM: CT HEAD WITHOUT CONTRAST  TECHNIQUE: Contiguous axial images were obtained from the base of the skull through the vertex without intravenous contrast.  COMPARISON:  Head CT scan 06/10/2014 and 04/10/2012.  FINDINGS: There is cortical atrophy and chronic microvascular ischemic change. No evidence  of acute intracranial abnormality including hemorrhage, infarct, mass lesion, mass effect, midline shift or abnormal extra-axial fluid collection is identified. No hydrocephalus or pneumocephalus. The calvarium is intact.  IMPRESSION: No acute abnormality.  Atrophy and chronic microvascular ischemic change.   Electronically Signed   By: Drusilla Kanner M.D.   On: 08/27/2014 14:45     Joanna Puff, MD 08/27/2014, 5:19 PM PGY-1, [facility] FPTS Intern pager: [phone], text pages welcome  Upper Level Addendum:  I have seen and evaluated this patient along with Dr.  Dorsey and reviewed the above note, making necessary revisions in pink.  On my exam, pt lying alone in hospital bed on bedpan. No family present. Pt with fluent speech but did not cooperate with neuro exam at all. Pulse regular and no respiratory distress. Pt complained of pain in abdomen however she did not flinch at all when I palpated belly. Await UA. Pt reportedly now at baseline. Agree with plan as documented by Dr. Leonides Schanz above.  Levert Feinstein, MD Family Medicine PGY-3

## 2014-08-27 NOTE — ED Notes (Signed)
Patient transported to MRI 

## 2014-08-27 NOTE — Telephone Encounter (Signed)
Spoke with pt's daughter, advised to bring her mom in.

## 2014-08-27 NOTE — Consult Note (Signed)
Referring Physician: Hyacinth Meeker    Chief Complaint: Code stroke  HPI:                                                                                                                                         Marcia Campbell is an 81 y.o. female who was last seen by a physician 10 weeks ago in ER also for weakness. No abnormalities were found on exam or head CT.   Pt's daughter states that pt was at adult health earlier today when she received a call around 10:45 AM for rapidly worsening acute onset of bilateral lower extremity weakness. Daughter also reports acute onset of leaning to the right while sitting and with walking as well as a tremor. Daughter initially noticed leaning earlier this week and the RN at adult care noticed it again today. Due to the worseing of LE weakness and leaning to the right EMS was called and patient was brought to ED as a code stroke.  Currently she is back to her baseline per daughter.   Date last known well: Date: 08/27/2014 Time last known well: Time: 09:15 tPA Given: No: out of window Modified Rankin: Rankin Score=3    Past Medical History  Diagnosis Date  . Dementia   . Diabetes mellitus   . Hypercholesteremia   . Anemia, unspecified 05/28/2013    Past Surgical History  Procedure Laterality Date  . Bowel resection    . Abdominal hysterectomy    . Breast reduction surgery      Family History  Problem Relation Age of Onset  . Diabetes Sister   . Cancer Daughter    Social History:  reports that she has never smoked. She has never used smokeless tobacco. She reports that she does not drink alcohol or use illicit drugs.  Allergies:  Allergies  Allergen Reactions  . Penicillins Itching    Medications:                                                                                                                           No current facility-administered medications for this encounter.   Current Outpatient Prescriptions  Medication Sig Dispense  Refill  . citalopram (CELEXA) 20 MG tablet Take 1 tablet (20 mg total) by mouth daily. Take 1/2 tablet daily. 90 tablet 3  . citalopram (CELEXA) 40 MG tablet Take 1  tablet by mouth daily. (Patient taking differently: Take 20 mg by mouth daily. ) 30 tablet 11  . donepezil (ARICEPT) 10 MG tablet TAKE 1 TABLET BY MOUTH EVERY NIGHT AT BEDTIME 90 tablet 2  . econazole nitrate 1 % cream Apply 1 application topically daily.   3  . Insulin Glargine (LANTUS SOLOSTAR) 100 UNIT/ML Solostar Pen Inject 12 units into the skin at bedtime. 15 mL 10  . loperamide (IMODIUM) 2 MG capsule Take 1 capsule (2 mg total) by mouth as needed for diarrhea or loose stools. Not to exceed 8 mg/day 30 capsule 0  . risperiDONE (RISPERDAL) 2 MG tablet Take 0.5 tablets (1 mg total) by mouth 2 (two) times daily. 90 tablet 3  . traMADol (ULTRAM) 50 MG tablet Take 50 mg by mouth every 6 (six) hours as needed for pain.       ROS:                                                                                                                                       History obtained from daughter  General ROS: negative for - chills, fatigue, fever, night sweats, weight gain or weight loss Psychological ROS: negative for - behavioral disorder, hallucinations, memory difficulties, mood swings or suicidal ideation Ophthalmic ROS: negative for - blurry vision, double vision, eye pain or loss of vision ENT ROS: negative for - epistaxis, nasal discharge, oral lesions, sore throat, tinnitus or vertigo Allergy and Immunology ROS: negative for - hives or itchy/watery eyes Hematological and Lymphatic ROS: negative for - bleeding problems, bruising or swollen lymph nodes Endocrine ROS: negative for - galactorrhea, hair pattern changes, polydipsia/polyuria or temperature intolerance Respiratory ROS: negative for - cough, hemoptysis, shortness of breath or wheezing Cardiovascular ROS: negative for - chest pain, dyspnea on exertion, edema or  irregular heartbeat Gastrointestinal ROS: negative for - abdominal pain, diarrhea, hematemesis, nausea/vomiting or stool incontinence Genito-Urinary ROS: negative for - dysuria, hematuria, incontinence or urinary frequency/urgency Musculoskeletal ROS: negative for - joint swelling or muscular weakness Neurological ROS: as noted in HPI Dermatological ROS: negative for rash and skin lesion changes  Neurologic Examination:                                                                                                      Blood pressure 125/39, pulse 59, temperature 98.7 F (37.1 C), temperature source Oral, SpO2 98 %.  HEENT-  Normocephalic, no lesions, without obvious abnormality.  Normal external eye and conjunctiva.  Normal TM's bilaterally.  Normal auditory canals and external ears. Normal external nose, mucus membranes and septum.  Normal pharynx. Cardiovascular- S1, S2 normal, pulses palpable throughout   Lungs- chest clear, no wheezing, rales, normal symmetric air entry Abdomen- normal findings: bowel sounds normal Extremities- no edema Lymph-no adenopathy palpable Musculoskeletal-no joint tenderness, deformity or swelling Skin-warm and dry, no hyperpigmentation, vitiligo, or suspicious lesions  Neurological Examination Mental Status: Alert, not oriented with baseline dementia.  Speech fluent without evidence of aphasia.  Able to follow simple commands without difficulty. Cranial Nerves: II: Discs flat bilaterally; Visual fields grossly normal, pupils equal, round, reactive to light and accommodation III,IV, VI: ptosis not present, extra-ocular motions intact bilaterally V,VII: smile symmetric, facial light touch sensation normal bilaterally VIII: hearing normal bilaterally IX,X: uvula rises symmetrically XI: bilateral shoulder shrug XII: midline tongue extension Motor: Moving all extremities antigravity with no focal weakness.  Tone and bulk:normal tone throughout; no atrophy  noted Sensory: Pinprick and light touch intact throughout, bilaterally Deep Tendon Reflexes: 2+ and symmetric throughout UE and 1+ in KJ no AJ bilaterally Plantars: Right: downgoing   Left: downgoing Cerebellar: normal finger-to-nose, Gait: not tested due to safety       Lab Results: Basic Metabolic Panel: No results for input(s): NA, K, CL, CO2, GLUCOSE, BUN, CREATININE, CALCIUM, MG, PHOS in the last 168 hours.  Liver Function Tests: No results for input(s): AST, ALT, ALKPHOS, BILITOT, PROT, ALBUMIN in the last 168 hours. No results for input(s): LIPASE, AMYLASE in the last 168 hours. No results for input(s): AMMONIA in the last 168 hours.  CBC:  Recent Labs Lab 08/27/14 1343  WBC 6.0  HGB 10.5*  HCT 32.8*  MCV 93.5    Cardiac Enzymes: No results for input(s): CKTOTAL, CKMB, CKMBINDEX, TROPONINI in the last 168 hours.  Lipid Panel: No results for input(s): CHOL, TRIG, HDL, CHOLHDL, VLDL, LDLCALC in the last 168 hours.  CBG: No results for input(s): GLUCAP in the last 168 hours.  Microbiology: Results for orders placed or performed during the hospital encounter of 06/10/14  Urine culture     Status: None   Collection Time: 06/10/14 10:26 PM  Result Value Ref Range Status   Specimen Description URINE, CATHETERIZED  Final   Special Requests NONE  Final   Colony Count NO GROWTH Performed at [facility]   Final   Culture NO GROWTH Performed at [facility]   Final   Report Status 06/12/2014 FINAL  Final    Coagulation Studies: No results for input(s): LABPROT, INR in the last 72 hours.  Imaging: Ct Head (brain) Wo Contrast  08/27/2014   CLINICAL DATA:  The patient is leaning to the right today. Initial encounter.  EXAM: CT HEAD WITHOUT CONTRAST  TECHNIQUE: Contiguous axial images were obtained from the base of the skull through the vertex without intravenous contrast.  COMPARISON:  Head CT scan 06/10/2014 and 04/10/2012.  FINDINGS: There is  cortical atrophy and chronic microvascular ischemic change. No evidence of acute intracranial abnormality including hemorrhage, infarct, mass lesion, mass effect, midline shift or abnormal extra-axial fluid collection is identified. No hydrocephalus or pneumocephalus. The calvarium is intact.  IMPRESSION: No acute abnormality.  Atrophy and chronic microvascular ischemic change.   Electronically Signed   By: Drusilla Kanner M.D.   On: 08/27/2014 14:45       Assessment and plan discussed with with attending physician and they are in agreement.    Felicie Morn PA-C [facility] [phone]  08/27/2014,  2:53 PM   Assessment: 81 y.o. female with 10 week history of bilateral LE weakness with increased weakness noted today during adult day care. On arrival to ED symptoms had resolved.  tPA was not administered as she was out of window and symptoms had resolved. TIA/CVA must be considered given risk factors.  Would also evaluate for underlying infective or metabolic process.   Stroke Risk Factors - diabetes mellitus and hyperlipidemia  Recommend: 1) MRI brain--if negative for stroke no further stroke work up warranted.  2) further evaluation for metabolic or infectious process.    If MRI is negative no further neurology work up needed and neurology will S/O  Patient seen and examined together with physician assistant and I concur with the assessment and plan.  Wyatt Portela, MD

## 2014-08-27 NOTE — ED Provider Notes (Signed)
CSN: [id number]     Arrival date & time 08/27/14  1431 History   First MD Initiated Contact with Patient 08/27/14 1504     Chief Complaint  Patient presents with  . Code Stroke    @  HPI Pt presents to the ED with complaints of leaning towards one side.  Pt walked to the bus at 0915 this morning to her adult day care.  That was the last time she was known to be normal.  This afternoon staff noticed that she was leaning off to the right side.  911 was called because they were concerned about stroke.  Pt denies any complaints.  History is somewhat limited.  Pt has history of late stage dementia.  No CP or SOB.  No headache.  No vomiting or diarrhea. Past Medical History  Diagnosis Date  . Dementia   . Diabetes mellitus   . Hypercholesteremia   . Anemia, unspecified 05/28/2013   Past Surgical History  Procedure Laterality Date  . Bowel resection    . Abdominal hysterectomy    . Breast reduction surgery     Family History  Problem Relation Age of Onset  . Diabetes Sister   . Cancer Daughter    History  Substance Use Topics  . Smoking status: Never Smoker   . Smokeless tobacco: Never Used  . Alcohol Use: No   OB History    No data available     Review of Systems  All other systems reviewed and are negative.     Allergies  Penicillins  Home Medications   Prior to Admission medications   Medication Sig Start Date End Date Taking? Authorizing Provider  citalopram (CELEXA) 20 MG tablet Take 1 tablet (20 mg total) by mouth daily. Take 1/2 tablet daily. Patient taking differently: Take 10 mg by mouth daily. Take 1/2 tablet daily. 06/11/14  Yes Tonye Pearson, MD  donepezil (ARICEPT) 10 MG tablet TAKE 1 TABLET BY MOUTH EVERY NIGHT AT BEDTIME 07/08/14  Yes Suanne Marker, MD  Insulin Glargine (LANTUS SOLOSTAR) 100 UNIT/ML Solostar Pen Inject 12 units into the skin at bedtime. 04/21/14  Yes Tonye Pearson, MD  risperiDONE (RISPERDAL) 2 MG tablet Take 0.5  tablets (1 mg total) by mouth 2 (two) times daily. 06/10/14  Yes Tonye Pearson, MD  traMADol (ULTRAM) 50 MG tablet Take 50 mg by mouth every 6 (six) hours as needed for pain.   Yes Historical Provider, MD  citalopram (CELEXA) 40 MG tablet Take 1 tablet by mouth daily. Patient taking differently: Take 20 mg by mouth daily.  04/21/14   Tonye Pearson, MD  loperamide (IMODIUM) 2 MG capsule Take 1 capsule (2 mg total) by mouth as needed for diarrhea or loose stools. Not to exceed 8 mg/day 06/29/14   Dorna Leitz, PA-C   BP 112/53 mmHg  Pulse 62  Temp(Src) 99.5 F (37.5 C) (Oral)  Resp 20  Ht 5' (1.524 m)  SpO2 98% Physical Exam  Constitutional: No distress.  Thin frail  HENT:  Head: Normocephalic and atraumatic.  Right Ear: External ear normal.  Left Ear: External ear normal.  Eyes: Conjunctivae are normal. Right eye exhibits no discharge. Left eye exhibits no discharge. No scleral icterus.  Neck: Neck supple. No tracheal deviation present.  Cardiovascular: Normal rate, regular rhythm and intact distal pulses.   Pulmonary/Chest: Effort normal and breath sounds normal. No stridor. No respiratory distress. She has no wheezes. She has no rales.  Abdominal: Soft.  Bowel sounds are normal. She exhibits no distension. There is no tenderness. There is no rebound and no guarding.  Musculoskeletal: She exhibits no edema or tenderness.  Neurological: She is alert. She is disoriented. No cranial nerve deficit (no facial droop, extraocular movements intact, no slurred speech) or sensory deficit. She exhibits normal muscle tone. She displays no seizure activity.  General weakness,  Pt will lift both arms and legs off the bed but will not hold them off the bed for more than a few seconds  Skin: Skin is warm and dry. No rash noted. She is not diaphoretic.  Psychiatric: She has a normal mood and affect.  Nursing note and vitals reviewed.   ED Course  Procedures (including critical care time) Labs  Review Labs Reviewed  CBC - Abnormal; Notable for the following:    RBC 3.32 (*)    Hemoglobin 10.3 (*)    HCT 31.1 (*)    All other components within normal limits  COMPREHENSIVE METABOLIC PANEL - Abnormal; Notable for the following:    Creatinine, Ser 1.03 (*)    Albumin 3.4 (*)    GFR calc non Af Amer 46 (*)    GFR calc Af Amer 54 (*)    All other components within normal limits  GLUCOSE, CAPILLARY - Abnormal; Notable for the following:    Glucose-Capillary 142 (*)    All other components within normal limits  GLUCOSE, CAPILLARY - Abnormal; Notable for the following:    Glucose-Capillary 142 (*)    All other components within normal limits  I-STAT CHEM 8, ED - Abnormal; Notable for the following:    Hemoglobin 11.2 (*)    HCT 33.0 (*)    All other components within normal limits  ETHANOL  PROTIME-INR  APTT  DIFFERENTIAL  URINE RAPID DRUG SCREEN, HOSP PERFORMED  URINALYSIS, ROUTINE W REFLEX MICROSCOPIC (NOT AT [facility])  GLUCOSE, CAPILLARY  GLUCOSE, CAPILLARY  GLUCOSE, CAPILLARY  GLUCOSE, CAPILLARY  GLUCOSE, CAPILLARY  HEMOGLOBIN A1C  I-STAT TROPOININ, ED  CBG MONITORING, ED  I-STAT CHEM 8, ED    Imaging Review Ct Head (brain) Wo Contrast  08/27/2014   CLINICAL DATA:  The patient is leaning to the right today. Initial encounter.  EXAM: CT HEAD WITHOUT CONTRAST  TECHNIQUE: Contiguous axial images were obtained from the base of the skull through the vertex without intravenous contrast.  COMPARISON:  Head CT scan 06/10/2014 and 04/10/2012.  FINDINGS: There is cortical atrophy and chronic microvascular ischemic change. No evidence of acute intracranial abnormality including hemorrhage, infarct, mass lesion, mass effect, midline shift or abnormal extra-axial fluid collection is identified. No hydrocephalus or pneumocephalus. The calvarium is intact.  IMPRESSION: No acute abnormality.  Atrophy and chronic microvascular ischemic change.   Electronically Signed   By: Drusilla Kanner  M.D.   On: 08/27/2014 14:45     EKG Interpretation   Date/Time:  Thursday August 27 2014 15:07:37 EDT Ventricular Rate:  71 PR Interval:  150 QRS Duration: 76 QT Interval:  416 QTC Calculation: 452 R Axis:   -35 Text Interpretation:  Sinus rhythm Left axis deviation No significant  change since last tracing Confirmed by Antino Mayabb  MD-J, Jermon Chalfant ([id number]) on  08/27/2014 3:17:46 PM      MDM   Final diagnoses:  Weakness    Pt seen by neurology for possible code stroke.  Pt was evaluated.  Not a candidate for any acute intervention.   I contacted family medicine service regarding admission for further work up.  Linwood Dibbles, MD 08/28/14 1341

## 2014-08-27 NOTE — ED Notes (Signed)
Pt daughter at bedside, informed of protocol for when pt does not pass swallow screen due to pt requesting food. Pt family member verbalized understanding, cooperative. Meal given to family member at her request.

## 2014-08-27 NOTE — ED Notes (Addendum)
Pt from home via [facility] with c/o right sided leaning starting this morning while at a day care center.  Pt has equal drips, no weakness noted by EMS, follows all commands.  Hx of late stage dementia, per daughter pt at baseline.  Pt in NAD, A&O.

## 2014-08-27 NOTE — Progress Notes (Signed)
Code stroke called at 1418, Patient arrived to [facility] via [facility] at 1431.  AS per EMS was at adult day care where she was noted to be leaning to the right side.  LSN 0915.  Patient has history of dementia.  NIHSS 6.

## 2014-08-28 DIAGNOSIS — R29898 Other symptoms and signs involving the musculoskeletal system: Secondary | ICD-10-CM | POA: Insufficient documentation

## 2014-08-28 DIAGNOSIS — F0390 Unspecified dementia without behavioral disturbance: Secondary | ICD-10-CM | POA: Insufficient documentation

## 2014-08-28 DIAGNOSIS — F0391 Unspecified dementia with behavioral disturbance: Secondary | ICD-10-CM | POA: Diagnosis not present

## 2014-08-28 LAB — GLUCOSE, CAPILLARY
GLUCOSE-CAPILLARY: 142 mg/dL — AB (ref 65–99)
GLUCOSE-CAPILLARY: 67 mg/dL (ref 65–99)
GLUCOSE-CAPILLARY: 71 mg/dL (ref 65–99)
GLUCOSE-CAPILLARY: 82 mg/dL (ref 65–99)
Glucose-Capillary: 73 mg/dL (ref 65–99)
Glucose-Capillary: 142 mg/dL — ABNORMAL HIGH (ref 65–99)
Glucose-Capillary: 108 mg/dL — ABNORMAL HIGH (ref 65–99)
Glucose-Capillary: 157 mg/dL — ABNORMAL HIGH (ref 65–99)

## 2014-08-28 MED ORDER — ASPIRIN 81 MG PO CHEW
81.0000 mg | CHEWABLE_TABLET | Freq: Every day | ORAL | Status: DC
  Administered 2014-08-28 – 2014-08-30 (×3): 81 mg via ORAL
  Filled 2014-08-28 (×3): qty 1

## 2014-08-28 MED ORDER — DEXTROSE 50 % IV SOLN
INTRAVENOUS | Status: AC
  Administered 2014-08-28: 50 mL
  Filled 2014-08-28: qty 50

## 2014-08-28 MED ORDER — CARBIDOPA-LEVODOPA 25-100 MG PO TABS
0.5000 | ORAL_TABLET | Freq: Two times a day (BID) | ORAL | Status: DC
  Administered 2014-08-28 – 2014-08-30 (×5): 0.5 via ORAL
  Filled 2014-08-28 (×5): qty 1

## 2014-08-28 MED ORDER — CARBIDOPA-LEVODOPA 25-100 MG PO TABS
1.0000 | ORAL_TABLET | Freq: Two times a day (BID) | ORAL | Status: DC
Start: 2014-08-28 — End: 2014-08-28

## 2014-08-28 MED ORDER — DEXTROSE-NACL 5-0.45 % IV SOLN
INTRAVENOUS | Status: AC
  Administered 2014-08-28: 09:00:00 via INTRAVENOUS

## 2014-08-28 NOTE — Clinical Social Work Note (Signed)
Clinical Social Work Assessment  Patient Details  Name: Marcia Campbell MRN: [id number] Date of Birth: August 28, 1921  Date of referral:  08/28/14               Reason for consult:  Facility Placement                Housing/Transportation Living arrangements for the past 2 months:  Single Family Home Source of Information:  Adult Children Narda Bonds) Patient Interpreter Needed:  None Criminal Activity/Legal Involvement Pertinent to Current Situation/Hospitalization:  No - Comment as needed Significant Relationships:  Adult Children Lives with:  Adult Children Do you feel safe going back to the place where you live?  No Need for family participation in patient care:  No (Coment)  Care giving concerns:  None    Facilities manager / plan:  CSW met the pt's daughter [name] at bedside. CSW introduced self and purpose of the visit. CSW discussed SNF rehab. CSW explained the SNF process. CSW explained insurance and its relation to SNF placement. CSW provided the [place] with a SNF list. CSW and [name] discussed geographic location in which the she would like to receive rehab. [name] reported that she will like the pt to remain in [place]. CSW answered all questions in which the [place] inquired about. CSW will continue to follow this pt and assist with discharge as needed.  Employment status:  Retired Nurse, adult PT Recommendations:  [place] / Referral to community resources:  [place]  Patient/Family's Response to care:  [place] reported that the staff has treated and cared for the pt well.   Patient/Family's Understanding of and Emotional Response to Diagnosis, Current Treatment, and Prognosis:  Vience reported that she will be meeting with the MD to discuss the pt condition in detail.   Emotional Assessment Appearance:  Appears stated age Attitude/Demeanor/Rapport:  Unable to Assess Affect  (typically observed):  Flat Orientation:   (Disoriented ) Alcohol / Substance use:  Not Applicable Psych involvement (Current and /or in the community):  No (Comment)  Discharge Needs  Concerns to be addressed:  Denies Needs/Concerns at this time Readmission within the last 30 days:  No Current discharge risk:  None Barriers to Discharge:  No Barriers Identified   Marcia Odonovan, LCSW 08/28/2014, 11:57 AM

## 2014-08-28 NOTE — Evaluation (Signed)
Physical Therapy Evaluation Patient Details Name: Marcia Campbell MRN: [id number] DOB: 1921-06-30 Today's Date: 08/28/2014   History of Present Illness  pt presents with weakness and hx of Dementia, Glaucoma, DM, and Gait Apraxia.    Clinical Impression  No family present during evaluation today, but per chart pt had been mobile and only needing A for ADLs.  At this time pt will need increased A for all mobility and unsure if family are able to provide this to pt.  Feel SNF level of care would be safest D/C plan for pt unless family feel they can provide a higher level of care than they had been previously.  Will continue to follow.      Follow Up Recommendations SNF    Equipment Recommendations  None recommended by PT    Recommendations for Other Services       Precautions / Restrictions Precautions Precautions: Fall Restrictions Weight Bearing Restrictions: No      Mobility  Bed Mobility               General bed mobility comments: pt sitting in recliner.  Transfers Overall transfer level: Needs assistance Equipment used: Rolling walker (2 wheeled) Transfers: Sit to/from Stand Sit to Stand: Mod assist         General transfer comment: pt with R lateral lean and posterior lean with coming to stand.  pt needs step-by-step cues for safe technique.  pt needs facilitation for hip flexion with returning to sitting as pt tends to simply lean backwards with uncontrolled descent to sitting.    Ambulation/Gait Ambulation/Gait assistance: Min assist;+2 safety/equipment Ambulation Distance (Feet): 15 Feet Assistive device: Rolling walker (2 wheeled) Gait Pattern/deviations: Step-through pattern;Decreased stride length;Shuffle;Narrow base of support;Trunk flexed     General Gait Details: pt needs A for maintaining balance, management of RW, and +2 for chair follow.  pt leans to R side and has difficulty with correcting balance in standing.    Stairs             Wheelchair Mobility    Modified Rankin (Stroke Patients Only) Modified Rankin (Stroke Patients Only) Pre-Morbid Rankin Score: Moderate disability Modified Rankin: Moderately severe disability     Balance Overall balance assessment: Needs assistance Sitting-balance support: No upper extremity supported;Feet supported Sitting balance-Leahy Scale: Poor Sitting balance - Comments: pt tends not to use UEs despite holding them by her sides.   Postural control: Right lateral lean Standing balance support: Bilateral upper extremity supported;During functional activity Standing balance-Leahy Scale: Poor                               Pertinent Vitals/Pain Pain Assessment: No/denies pain    Home Living Family/patient expects to be discharged to:: Skilled nursing facility                      Prior Function Level of Independence: Needs assistance   Gait / Transfers Assistance Needed: Per chart review pt used 4WW at times and went to adult day program.  ADL's / Homemaking Assistance Needed: Per chart review pt's daughter and caregiver A with ADLs.        Hand Dominance        Extremity/Trunk Assessment   Upper Extremity Assessment: Defer to OT evaluation           Lower Extremity Assessment: Generalized weakness;Difficult to assess due to impaired cognition      Cervical /  Trunk Assessment: Kyphotic  Communication   Communication:  (Limited responses.)  Cognition Arousal/Alertness: Awake/alert Behavior During Therapy: Flat affect Overall Cognitive Status: No family/caregiver present to determine baseline cognitive functioning                      General Comments      Exercises        Assessment/Plan    PT Assessment Patient needs continued PT services  PT Diagnosis Difficulty walking   PT Problem List Decreased strength;Decreased activity tolerance;Decreased balance;Decreased mobility;Decreased coordination;Decreased  cognition;Decreased knowledge of use of DME;Decreased safety awareness  PT Treatment Interventions DME instruction;Gait training;Functional mobility training;Therapeutic activities;Therapeutic exercise;Balance training;Neuromuscular re-education;Cognitive remediation;Patient/family education   PT Goals (Current goals can be found in the Care Plan section) Acute Rehab PT Goals Patient Stated Goal: pt unable to state.  PT Goal Formulation: Patient unable to participate in goal setting Time For Goal Achievement: 09/11/14 Potential to Achieve Goals: Fair    Frequency Min 2X/week   Barriers to discharge        Co-evaluation               End of Session Equipment Utilized During Treatment: Gait belt Activity Tolerance: Patient tolerated treatment well Patient left: in chair;with call bell/phone within reach;with nursing/sitter in room Nurse Communication: Mobility status    Functional Assessment Tool Used: Clinical Judgement Functional Limitation: Mobility: Walking and moving around Mobility: Walking and Moving Around Current Status [phone]): At least 20 percent but less than 40 percent impaired, limited or restricted Mobility: Walking and Moving Around Goal Status [phone]): At least 1 percent but less than 20 percent impaired, limited or restricted    Time: 0913-0931 PT Time Calculation (min) (ACUTE ONLY): 18 min   Charges:   PT Evaluation $Initial PT Evaluation Tier I: 1 Procedure     PT G Codes:   PT G-Codes **NOT FOR INPATIENT CLASS** Functional Assessment Tool Used: Clinical Judgement Functional Limitation: Mobility: Walking and moving around Mobility: Walking and Moving Around Current Status ([id number]): At least 20 percent but less than 40 percent impaired, limited or restricted Mobility: Walking and Moving Around Goal Status [phone]): At least 1 percent but less than 20 percent impaired, limited or restricted    Sunny Schlein, [place] [phone] 08/28/2014, 10:05 AM

## 2014-08-28 NOTE — Progress Notes (Signed)
Family Medicine Teaching Service Daily Progress Note Intern Pager: [phone]  Patient name: Marcia Campbell Medical record number: [id number] Date of birth: 08/30/1921 Age: 81 y.o. Gender: female  Primary Care Provider: Tonye Pearson, MD Consultants: Neurology  Code Status: Full per discussion with daughter.   Pt Overview and Major Events to Date:  6/23: bilateral weakness and leaning towards the R, concerning for CVA vs TIA.  Assessment and Plan: Marcia Campbell is a 81 y.o. female presenting with bilateral weakness. PMH is significant for DM, HLD, dementia, anemia, glaucoma, and dysthymic disorder.   Bilateral weakness: Patient was last known well around 9:15am. She was noted to be leaning towards the R at her adult daycare and was noted to have bilateral weakness around 10:45am, now back to baseline per daughter, however she has not seen her walk. No acute changes on CT. Vital signs are stable without fever, tachycardia, or tachypnea. No leukocytosis concerning for infection. U/A not consistent with UTI.  Unfortunately it is difficult to obtain a history from the patient due to her dementia to ascertain of any possible infectious source, however none noted on exam or per daughter report. Hemoglobin is stable. CBG 108 on presentation. UDS negative. Patient does not appear dehydrated on exam/labs. No metabolic process noted in labs. Neurology consulted in ED and suggested MRI and evaluation for other possible underlying processes (metabolic vs infectious). Of note, the patient is on risperidone: given her h/o dementia she is at an increased risk of CVA. Additionally, given tongue fasciculations and  waxy tension with leg/arm extension, there is a concern for parkinsonian/akinesthias  like symptoms, however no cog-wheeling noted.  - discontinue telemetry so pt has more freedom  - Neurology consulted, appreciate recs - will hold risperidone and monitor symptoms.  - If sedation is  required, patient may not be able to get MRI brain as benzos are not a great option for the elderly with dementia and we'd like to avoid haldol given concerns for risperidone side effect.  - PT/OT/SLP (patient failed RN bedside swallow test)  Diabetes mellitus: Last A1c 6.8 12/2013. On Lantus 12u at home.  - NPO until pt passes bedside swallow  - Patient CBGs have been in the 60s per RN and she required a amp D50. - Will start D5-1/2NS while NPO - Sensitive SSI while NPO, continue to monitor CBGs - Hold Lantus for now  Dysthymic disorder: difficult to assess status.  - Continue home Celexa  daily once passes swallow eval  Iron deficiency anemia  - stable, continue to monitor   Moderate dementia: progressively worsening per daughter. No specific etiology noted in problem list (TSH nml, CT with chronic small vessel changes, no RPR documented). As expected, long term memory intact, short term memory and execution are difficult for the patient.  - continue Aricept  qHS once passes swallow eval - Patient lives with daughter (used to be a Nurse, adult for an adult daycare).   FEN/GI: NPO until SLP, D5-1/2 NS at 50cc/hr while NPO (quick to IV saline lock once taking PO).  Prophylaxis: Lovenox  (CCr<30)  Disposition: Home pending work up and PT evaluation.  Subjective:  Patient initially states she has pain in her knees bilaterally, however then denies having pain later.    Discussed the patient with her daughter/caretaker, Marcia Campbell. From her report, the pt has been on risperidone for approximately 2 yrs, however was increased (and citalopram was decreased) in March 2016 due to worsening anxiety.  She has been moving more  slowly since around that time.  The patient has had a slow shuffled walk since fall of last year and "doesn't extend her legs fully."  If you ask her to lock her knees she'll walk normally, then goes back to her short shuffle steps per her daughter.  Additionally, she sometimes complains about her knees hurting   Objective: Temp:  [97.5 F (36.4 C)-98.7 F (37.1 C)] 98.4 F (36.9 C) (06/24 0600) Pulse Rate:  [48-82] 72 (06/24 0600) Resp:  [14-18] 14 (06/24 0400) BP: (110-150)/(39-83) 148/67 mmHg (06/24 0600) SpO2:  [97 %-100 %] 99 % (06/24 0600) Physical Exam: General: Lying in bed refusing to open her eyes initially. Once she gets up on the side of the bed, she's more alert Cardiovascular: RRR, no m/r/g noted. No pitting edema. Respiratory: Lungs CTAB without wheezing, rhonchi, or crackles noted. Non-labored Abdomen: +BS, soft, ND/NT Extremities: No gross deformities  Neuro: Oriented to person: [name]. Not oriented to place, time, or situation. Speech clear. Patient refuses to open eyes or mouth. Face appears symmetric.  Sensation intact in V1-V3 b/l. Shoulder shrug intact. Moves all extremities. When in the bedside chair, she's noted to have some rigidity/tightness in extension of LE and UEs, and is slow to go back to baseline. No cog-wheeling noted. Downgoing babinski b/l.   Laboratory:  Recent Labs Lab 08/27/14 1343 08/27/14 1450 08/27/14 1454  WBC 6.0 5.4  --   HGB 10.5* 10.3* 11.2*  HCT 32.8* 31.1* 33.0*  PLT  --  206  --     Recent Labs Lab 08/27/14 1450 08/27/14 1454  NA 141 141  K 4.1 4.0  CL 107 107  CO2 26  --   BUN 13 18  CREATININE 1.03* 1.00  CALCIUM 9.3  --   PROT 6.6  --   BILITOT 0.9  --   ALKPHOS 52  --   ALT 23  --   AST 29  --   GLUCOSE 87 92   EKG: NSR, HR 71, LAD. T wave inversion in aVR, flattened T wave in V2 and V3. QTc  Imaging/Diagnostic Tests: Ct Head (brain) Wo Contrast  08/27/2014   CLINICAL DATA:  The patient is leaning to the right today. Initial encounter.  EXAM: CT HEAD WITHOUT CONTRAST  TECHNIQUE: Contiguous axial images were obtained from the base of the skull through the vertex without intravenous contrast.  COMPARISON:  Head CT scan 06/10/2014 and 04/10/2012.   FINDINGS: There is cortical atrophy and chronic microvascular ischemic change. No evidence of acute intracranial abnormality including hemorrhage, infarct, mass lesion, mass effect, midline shift or abnormal extra-axial fluid collection is identified. No hydrocephalus or pneumocephalus. The calvarium is intact.  IMPRESSION: No acute abnormality.  Atrophy and chronic microvascular ischemic change.   Electronically Signed   By: Drusilla Kanner M.D.   On: 08/27/2014 14:45    Joanna Puff, MD 08/28/2014, 7:01 AM PGY-1, [facility] Family Medicine FPTS Intern pager: [phone], text pages welcome

## 2014-08-28 NOTE — Evaluation (Signed)
Occupational Therapy Evaluation Patient Details Name: HOLLAN MEWES MRN: [id number] DOB: 09-Jun-1921 Today's Date: 08/28/2014    History of Present Illness pt presents with weakness and hx of Dementia, Glaucoma, DM, and Gait Apraxia.     Clinical Impression   This 80 yo female admitted with above presents to acute OT with decreased balance, decreased mobility, decreased vision, decreased initiation of speech and mobility, decreased AROM/PROM of shoulders and elbows all affecting her ability to A with her care. She will benefit from acute OT with follow up OT at SNF.    Follow Up Recommendations  SNF    Equipment Recommendations   (TBD at next venue)       Precautions / Restrictions Precautions Precautions: Fall Restrictions Weight Bearing Restrictions: No      Mobility Bed Mobility               General bed mobility comments: pt sitting in recliner up arrival  Transfers Overall transfer level: Needs assistance Equipment used:  (one person standing in front of her and using gait belt) Transfers: Sit to/from Stand Sit to Stand: Mod assist            Balance Overall balance assessment: Needs assistance Sitting-balance support: No upper extremity supported;Feet supported Sitting balance-Leahy Scale: Poor Sitting balance - Comments: pt tends not to use UEs despite holding them by her sides.   Postural control: Right lateral lean Standing balance support: Bilateral upper extremity supported;During functional activity Standing balance-Leahy Scale: Poor                              ADL Overall ADL's : Needs assistance/impaired Eating/Feeding: Minimal assistance;Sitting (supported for eating applesauce as long as it was midline and to her right)   Grooming: Wash/dry face;Sitting;Total assistance (she put it to her face, but then did not attempt to wash)   Upper Body Bathing: Maximal assistance;Sitting   Lower Body Bathing: Total assistance (with  +1 to stand with mod A and +1 to wash)   Upper Body Dressing : Total assistance;Sitting   Lower Body Dressing: Total assistance (with Mod +1 to stand and +1 to pull clothing up)   Toilet Transfer: Moderate assistance;+2 for physical assistance;Stand-pivot;BSC   Toileting- Clothing Manipulation and Hygiene: Total assistance (Mod +1 to stand and +1 for clothing and hygiene)               Vision Additional Comments: Pt with tendency to keep head and eyes midline or to her right, wth attempt to rotate her head past midline to her left, it is very stiff          Pertinent Vitals/Pain Pain Assessment: No/denies pain     Hand Dominance Right   Extremity/Trunk Assessment Upper Extremity Assessment Upper Extremity Assessment: RUE deficits/detail;LUE deficits/detail RUE Deficits / Details: Decreased AROM/PROM at shoulders and elbows; rest WNL RUE Coordination: decreased gross motor LUE Deficits / Details: Decreased AROM/PROM at shoulders and elbows; rest WNL LUE Coordination: decreased gross motor     Communication Communication Communication: Receptive difficulties;Expressive difficulties   Cognition Arousal/Alertness: Lethargic Behavior During Therapy: Flat affect Overall Cognitive Status: No family/caregiver present to determine baseline cognitive functioning                                Home Living Family/patient expects to be discharged to:: Skilled nursing facility  Prior Functioning/Environment Level of Independence: Needs assistance  Gait / Transfers Assistance Needed: Per chart review pt used 4WW at times and went to adult day program. ADL's / Homemaking Assistance Needed: Per chart review pt's daughter and caregiver A with ADLs. Communication / Swallowing Assistance Needed: pt only responds with very short couple word responses.        OT Diagnosis: Generalized weakness;Cognitive  deficits;Disturbance of vision   OT Problem List: Decreased strength;Decreased range of motion;Decreased activity tolerance;Impaired balance (sitting and/or standing);Decreased cognition;Impaired UE functional use;Decreased coordination;Decreased knowledge of use of DME or AE;Impaired vision/perception   OT Treatment/Interventions: Self-care/ADL training;Visual/perceptual remediation/compensation;Patient/family education;Therapeutic exercise;Therapeutic activities;Balance training;DME and/or AE instruction    OT Goals(Current goals can be found in the care plan section) Acute Rehab OT Goals Patient Stated Goal: pt unable to state.  OT Goal Formulation: Patient unable to participate in goal setting Time For Goal Achievement: 09/04/14 Potential to Achieve Goals: Fair  OT Frequency: Min 2X/week   Barriers to D/C: Decreased caregiver support             End of Session Equipment Utilized During Treatment: Gait belt  Activity Tolerance: Patient limited by fatigue;Patient limited by lethargy Patient left: in chair;with nursing/sitter in room Psychiatrist)   Time: 1478-2956 OT Time Calculation (min): 22 min Charges:  OT General Charges $OT Visit: 1 Procedure OT Evaluation $Initial OT Evaluation Tier I: 1 Procedure  Evette Georges [phone] 08/28/2014, 11:45 AM

## 2014-08-28 NOTE — Clinical Social Work Placement (Addendum)
   CLINICAL SOCIAL WORK PLACEMENT  NOTE  Date:  08/28/2014  Patient Details  Name: Marcia Campbell MRN: [id number] Date of Birth: 08-26-1921  Clinical Social Work is seeking post-discharge placement for this patient at the Skilled  Nursing Facility level of care (*CSW will initial, date and re-position this form in  chart as items are completed):  Yes   Patient/family provided with [place] Clinical Social Work Department's list of facilities offering this level of care within the geographic area requested by the patient (or if unable, by the patient's family).  Yes   Patient/family informed of their freedom to choose among providers that offer the needed level of care, that participate in Medicare, Medicaid or managed care program needed by the patient, have an available bed and are willing to accept the patient.  Yes   Patient/family informed of [facility]'s ownership interest in [facility] and [facility], as well as of the fact that they are under no obligation to receive care at these facilities.  PASRR submitted to EDS on 08/28/14     PASRR number received on 08/28/14     Existing PASRR number confirmed on       FL2 transmitted to all facilities in geographic area requested by pt/family on 08/28/14     FL2 transmitted to all facilities within larger geographic area on       Patient informed that his/her managed care company has contracts with or will negotiate with certain facilities, including the following:        Yes   Patient/family informed of bed offers received.  Patient chooses bed at [facility]     Physician recommends and patient chooses bed at     SNF Patient to be transferred to   on  .  08/30/2014  Patient to be transferred to facility by     EMS/PTAR  Patient family notified on   of transfer. [name], patient daughter in room with patient  Name of family member notified:      Daughter, see above.  PHYSICIAN       Additional Comment:     _______________________________________________ Gwynne Edinger, LCSW 08/28/2014, 4:17 PM

## 2014-08-28 NOTE — Progress Notes (Signed)
Initial Nutrition Assessment  DOCUMENTATION CODES:  Not applicable  INTERVENTION:  Other (Comment) (Boost High Protein once daily)  Snack once daily  NUTRITION DIAGNOSIS:  Predicted suboptimal nutrient intake related to chronic illness as evidenced by moderate depletions of muscle mass, other (see comment) (and nutritional supplement dependence PTA).   GOAL:  Patient will meet greater than or equal to 90% of their needs   MONITOR:  PO intake, Supplement acceptance, Weight trends, Labs  REASON FOR ASSESSMENT:  Malnutrition Screening Tool    ASSESSMENT: 81 y.o. female presenting with bilateral weakness. PMH is significant for DM, HLD, dementia, anemia, glaucoma, and dysthymic disorder.   Pt denies any weight loss stating she continues to weigh around 106 lbs. She reports having a good appetite and eating well PTA which her daughter at bedside confirms. Per daughter, pt usually drinks Boost High Calorie once every other day. RD to order Boost and snacks. Per nursing notes, pt ate 75% of lunch today. Pt has some moderate muscle wasting per physical exam but, overall appears healthy.   Labs reviewed.   Height:  Ht Readings from Last 1 Encounters:  08/28/14 5' (1.524 m)    Weight:  Wt Readings from Last 1 Encounters:  04/21/14 106 lb 12.8 oz (48.444 kg)    Ideal Body Weight:  45.4 kg  Wt Readings from Last 10 Encounters:  04/21/14 106 lb 12.8 oz (48.444 kg)  12/25/13 111 lb (50.349 kg)  05/28/13 109 lb 6.4 oz (49.624 kg)  05/16/13 110 lb (49.896 kg)  12/18/12 109 lb (49.442 kg)  12/12/12 111 lb (50.349 kg)  11/18/12 120 lb (54.432 kg)  04/26/12 113 lb (51.256 kg)  01/01/12 120 lb 6.4 oz (54.613 kg)  08/29/11 120 lb (54.432 kg)    BMI:  There is no weight on file to calculate BMI.  Estimated Nutritional Needs:  Kcal:  1200-1400  Protein:  55-65 grams  Fluid:  1.2-1.4 L/day  Skin:  Reviewed, no issues  Diet Order:  DIET DYS 3 Room service  appropriate?: Yes; Fluid consistency:: Thin  EDUCATION NEEDS:  No education needs identified at this time   Intake/Output Summary (Last 24 hours) at 08/28/14 1249 Last data filed at 08/28/14 1237  Gross per 24 hour  Intake    600 ml  Output      0 ml  Net    600 ml    Last BM:  PTA  Ian Malkin RD, LDN Inpatient Clinical Dietitian Pager: [phone] After Hours Pager: [phone]

## 2014-08-28 NOTE — Clinical Social Work Placement (Signed)
   CLINICAL SOCIAL WORK PLACEMENT  NOTE  Date:  08/28/2014  Patient Details  Name: Marcia Campbell MRN: [id number] Date of Birth: [birth year]  Clinical Social Work is seeking post-discharge placement for this patient at the Skilled  Nursing Facility level of care (*CSW will initial, date and re-position this form in  chart as items are completed):  Yes   Patient/family provided with [place] Clinical Social Work Department's list of facilities offering this level of care within the geographic area requested by the patient (or if unable, by the patient's family).  Yes   Patient/family informed of their freedom to choose among providers that offer the needed level of care, that participate in Medicare, Medicaid or managed care program needed by the patient, have an available bed and are willing to accept the patient.  Yes   Patient/family informed of [facility]'s ownership interest in [facility] and [facility], as well as of the fact that they are under no obligation to receive care at these facilities.  PASRR submitted to EDS on       PASRR number received on       Existing PASRR number confirmed on       FL2 transmitted to all facilities in geographic area requested by pt/family on 08/28/14     FL2 transmitted to all facilities within larger geographic area on       Patient informed that his/her managed care company has contracts with or will negotiate with certain facilities, including the following:            Patient/family informed of bed offers received.  Patient chooses bed at       Physician recommends and patient chooses bed at      Patient to be transferred to   on  .  Patient to be transferred to facility by       Patient family notified on   of transfer.  Name of family member notified:        PHYSICIAN       Additional Comment:    _______________________________________________ Gwynne Edinger, LCSW 08/28/2014, 11:59 AM

## 2014-08-28 NOTE — Progress Notes (Signed)
Hypoglycemic Event  CBG: 67  Treatment: D50 IV 25 mL              Symptoms: None  Follow-up CBG: Time:0034 CBG Result:142  Possible Reasons for Event: Other: NPO   Comments/MD notified:Yes    Janee Morn, [place] Neal E  Remember to initiate Hypoglycemia Order Set & complete

## 2014-08-28 NOTE — Evaluation (Signed)
Clinical/Bedside Swallow Evaluation Patient Details  Name: Marcia Campbell MRN: [id number] Date of Birth: [birth year]  Today's Date: 08/28/2014 Time: SLP Start Time (ACUTE ONLY): 0850 SLP Stop Time (ACUTE ONLY): 4098 SLP Time Calculation (min) (ACUTE ONLY): 32 min  Past Medical History:  Past Medical History  Diagnosis Date  . Dementia   . Diabetes mellitus   . Hypercholesteremia   . Anemia, unspecified 05/28/2013   Past Surgical History:  Past Surgical History  Procedure Laterality Date  . Bowel resection    . Abdominal hysterectomy    . Breast reduction surgery     HPI:  81 yo female adm to [facility] - leaning to right side at her daycare and later found to have bilateral weakness  - ?  UTI.  PMH + for dementia, renal dx, glaucoma, dysrhythmic d/o- pt on Risperdal and other medication, anemia.  CT head negative.  Pt failed an RNSSS and speech swallow eval ordered.    Assessment / Plan / Recommendation Clinical Impression  Pt with symptoms of mild oropharyngeal cognitive-based dysphagia with vertical mastication pattern, slow oral transiting and suspected delayed pharyngeal swallow initiation. She did overtly cough with water via cup x1 - presumed to be due to premature spillage into airway resulting in aspiration. Good tolerance of liquids via straw noted. Pt did require SLP to feed her due to decreased initiation.    Recommend dys3/ground meats/thin with full supervision and close monitoring.  Suspect swallow ability to be near baseline but no family present to confirm.     Will follow up briefly to assure tolerance and educate family/pt.  Thanks for this order.     Aspiration Risk  Mild    Diet Recommendation Dysphagia 3 (Mech soft);Thin   Medication Administration: Whole meds with puree Compensations: Slow rate;Small sips/bites    Other  Recommendations Oral Care Recommendations: Oral care BID Other Recommendations: Clarify dietary restrictions   Follow Up  Recommendations       Frequency and Duration min 1 x/week  1 week   Pertinent Vitals/Pain Afebrile, decreased    SLP Swallow Goals     Swallow Study Prior Functional Status   see HHX    General Date of Onset: 08/27/14 Other Pertinent Information: 81 yo female adm to [facility] - leaning to right side at her daycare and later found to have bilateral weakness  - ?  UTI.  PMH + for dementia, renal dx, glaucoma, dysrhythmic d/o- pt on Risperdal and other medication, anemia.  CT head negative.  Pt failed an RNSSS and speech swallow eval ordered.  Type of Study: Bedside swallow evaluation Diet Prior to this Study: NPO Temperature Spikes Noted: No Respiratory Status: Room air History of Recent Intubation: No Behavior/Cognition: Alert;Other (Comment);Requires cueing;Distractible (delayed responses, inconsistently follows commands) Oral Cavity - Dentition:  (appears with dentures - difficult to visualize but appears with demarkation posterior palate) Self-Feeding Abilities: Needs assist;Total assist Patient Positioning: Upright in chair/Tumbleform Baseline Vocal Quality: Normal Volitional Cough: Cognitively unable to elicit Volitional Swallow: Unable to elicit    Oral/Motor/Sensory Function Overall Oral Motor/Sensory Function:  (no focal CN deficits from components pt would complete, no focal droop and tongue midline)   Ice Chips Ice chips: Not tested   Thin Liquid Thin Liquid: Impaired Presentation: Cup;Straw;Spoon Oral Phase Impairments: Reduced labial seal;Reduced lingual movement/coordination Oral Phase Functional Implications: Other (comment) (discoordination) Pharyngeal  Phase Impairments: Cough - Immediate Other Comments: cough x1 via cup, no deficits via straw    Nectar Thick Nectar Thick Liquid:  Within functional limits Presentation: Cup;Straw;Spoon   Honey Thick Honey Thick Liquid: Not tested   Puree Puree: Impaired Presentation: Spoon Oral Phase Impairments: Reduced lingual  movement/coordination;Impaired anterior to posterior transit Oral Phase Functional Implications: Prolonged oral transit Pharyngeal Phase Impairments: Suspected delayed Swallow   Solid   GO Functional Assessment Tool Used: clinical judgement Functional Limitations: Swallowing Swallow Current Status (T8882): At least 20 percent but less than 40 percent impaired, limited or restricted Swallow Goal Status [phone]): At least 1 percent but less than 20 percent impaired, limited or restricted  Solid: Impaired Oral Phase Impairments: Reduced lingual movement/coordination;Impaired anterior to posterior transit;Impaired mastication Oral Phase Functional Implications: Other (comment) (prolonged transit, vertical mastication pattern) Pharyngeal Phase Impairments: Suspected delayed Fredric Mare, MS [facility] SLP [phone]

## 2014-08-28 NOTE — Clinical Social Work Note (Addendum)
CSW submitted insurance authorization to [facility]. CSW awaiting to hear back.   AddendumCyndee Brightly Medicare provide the pt with authorization for SNF. Authorization number is [id number]. RUG ID: [id number]. The pt's next review date is 6/28. CSW provided this information to [place] at [facility].   Exie Chrismer, MSW, LCSWA [phone]

## 2014-08-28 NOTE — Progress Notes (Signed)
BSE completed, full report to follow.  Pt with symptoms of mild oropharyngeal dysphagia with vertical mastication pattern, slow oral transiting and suspected delayed pharyngeal swallow initiation.  She did overtly cough with water via cup x1 - presumed to be due to premature spillage into airway.  Good tolerance of liquids via straw noted.  Pt did require SLP to feed her due to decreased initiation. Recommend dys3/ground meat/thin Medicine with puree (whole) Use straws Full supervision  SlP to follow up briefly to assure tolerance, for family ed and readiness for advancement of diet.  Marcia Burnet, MS [facility] SLP [phone]

## 2014-08-29 ENCOUNTER — Observation Stay (HOSPITAL_COMMUNITY): Payer: Medicare Other

## 2014-08-29 DIAGNOSIS — F0391 Unspecified dementia with behavioral disturbance: Secondary | ICD-10-CM | POA: Diagnosis not present

## 2014-08-29 DIAGNOSIS — R29898 Other symptoms and signs involving the musculoskeletal system: Secondary | ICD-10-CM | POA: Diagnosis not present

## 2014-08-29 DIAGNOSIS — R531 Weakness: Secondary | ICD-10-CM | POA: Diagnosis not present

## 2014-08-29 LAB — GLUCOSE, CAPILLARY
GLUCOSE-CAPILLARY: 135 mg/dL — AB (ref 65–99)
GLUCOSE-CAPILLARY: 126 mg/dL — AB (ref 65–99)
GLUCOSE-CAPILLARY: 139 mg/dL — AB (ref 65–99)
Glucose-Capillary: 107 mg/dL — ABNORMAL HIGH (ref 65–99)
Glucose-Capillary: 117 mg/dL — ABNORMAL HIGH (ref 65–99)
Glucose-Capillary: 118 mg/dL — ABNORMAL HIGH (ref 65–99)
Glucose-Capillary: 124 mg/dL — ABNORMAL HIGH (ref 65–99)

## 2014-08-29 LAB — HEMOGLOBIN A1C
Hgb A1c MFr Bld: 6.8 % — ABNORMAL HIGH (ref 4.8–5.6)
Mean Plasma Glucose: 148 mg/dL

## 2014-08-29 MED ORDER — BOOST PLUS PO LIQD
237.0000 mL | ORAL | Status: DC
  Administered 2014-08-29: 237 mL via ORAL
  Filled 2014-08-29 (×4): qty 237

## 2014-08-29 NOTE — Progress Notes (Signed)
Family Medicine Teaching Service Daily Progress Note Intern Pager: [phone]  Patient name: Marcia Campbell Medical record number: [id number] Date of birth: 01-Aug-1921 Age: 81 y.o. Gender: female  Primary Care Provider: Tonye Pearson, MD Consultants: Neurology  Code Status: Full per discussion with daughter.   Pt Overview and Major Events to Date:  6/23: bilateral weakness and leaning towards the R, concerning for CVA vs TIA.  Assessment and Plan: Marcia Campbell is a 81 y.o. female presenting with bilateral weakness. PMH is significant for DM, HLD, dementia, anemia, glaucoma, and dysthymic disorder.   Bilateral weakness: Patient was last known well around 9:15am. She was noted to be leaning towards the R at her adult daycare and was noted to have bilateral weakness around 10:45am, now back to baseline per daughter, however she has not seen her walk. No acute changes on CT. Vital signs are stable without fever, tachycardia, or tachypnea. No leukocytosis concerning for infection. U/A not consistent with UTI.  Unfortunately it is difficult to obtain a history from the patient due to her dementia to ascertain of any possible infectious source, however none noted on exam or per daughter report. Hemoglobin is stable. CBG 108 on presentation. UDS negative. Patient does not appear dehydrated on exam/labs. No metabolic process noted in labs. Neurology consulted in ED and suggested MRI and evaluation for other possible underlying processes (metabolic vs infectious).  MRI degraded due to motion artifact however no acute process noted. Given tongue fasciculations and  Increased tone/rigidity with leg/arm extension, there is a concern for parkinsonian/akinesthias  like symptoms, however no cog-wheeling noted.  - Neurology consulted, appreciate recs  - will hold risperidone and monitor symptoms.  - Sinemet 0.5tabs BID- follow up symptoms for improvement, can increase frequency to TID if needed -  PT/OT/SLP: Dysphagia 3 diet and SNF placement  Diabetes mellitus: Last A1c 6.8 12/2013. On Lantus 12u at home.  - Now tolerating dysphagia 3 diet  - CBGs in the 100s - Sensitive SSI for now  - Hold Lantus for now  Dysthymic disorder: difficult to assess status.  - Continue home Celexa 10mg  daily once passes swallow eval  Iron deficiency anemia  - stable, continue to monitor   Severe stage dementia: Progressively worsening per daughter. AAlzheimer's vs Dementia with Lewy bodies. As expected, long term memory intact, short term memory and execution are difficult for the patient. - continue Aricept 10mg  qHS.  - Patient lives with daughter (used to be a Nurse, adult for an adult daycare), however recommendations for SNF - Holding Risperdal due to concerns for side effects (was on this for mood and paranoia). - If needed, could consider Seroquel as needed for hallucinations, delusions, or harmful behavior  - Avoid haldol and benzos as this could worsen things instead of improving them.   FEN/GI: IV Saline lock, Dysphagia 3 Prophylaxis: Lovenox 30mg  (CCr<30)  Disposition: Home pending work up and PT evaluation.  Subjective:  Patient initially states she has pain- points to behind her R ear, however later denies that anything hurts. Eating well per sitter.   Objective: Temp:  [98.2 F (36.8 C)-99.5 F (37.5 C)] 98.2 F (36.8 C) (06/25 0600) Pulse Rate:  [60-81] 60 (06/25 0600) Resp:  [18-20] 18 (06/25 0600) BP: (85-149)/(42-68) 122/58 mmHg (06/25 0600) SpO2:  [98 %-100 %] 100 % (06/25 0600) Weight:  [106 lb 12.7 oz (48.44 kg)] 106 lb 12.7 oz (48.44 kg) (06/25 0300) Physical Exam: General: Lying in bed, more amenable to open eyes once her glasses  are put on.  Cardiovascular: RRR, no m/r/g noted. No pitting edema. Respiratory: Lungs CTAB without wheezing, rhonchi, or crackles noted. Non-labored Abdomen: +BS, soft, ND/NT Extremities: No gross deformities  Neuro: Oriented to  person.  Not oriented to place, time, or situation. Speech clear. Facial movements appears symmetric. Tongue midline with fasciculations noted. Moves all extremities spontaneously. Continues to have rigidity/tightness in LE and UEs. No cog-wheeling noted. Downgoing babinski bilaterally.   Laboratory:  Recent Labs Lab 08/27/14 1343 08/27/14 1450 08/27/14 1454  WBC 6.0 5.4  --   HGB 10.5* 10.3* 11.2*  HCT 32.8* 31.1* 33.0*  PLT  --  206  --     Recent Labs Lab 08/27/14 1450 08/27/14 1454  NA 141 141  K 4.1 4.0  CL 107 107  CO2 26  --   BUN 13 18  CREATININE 1.03* 1.00  CALCIUM 9.3  --   PROT 6.6  --   BILITOT 0.9  --   ALKPHOS 52  --   ALT 23  --   AST 29  --   GLUCOSE 87 92   EKG: NSR, HR 71, LAD. T wave inversion in aVR, flattened T wave in V2 and V3. QTc  Imaging/Diagnostic Tests: Ct Head (brain) Wo Contrast  08/27/2014   CLINICAL DATA:  The patient is leaning to the right today. Initial encounter.  EXAM: CT HEAD WITHOUT CONTRAST  TECHNIQUE: Contiguous axial images were obtained from the base of the skull through the vertex without intravenous contrast.  COMPARISON:  Head CT scan 06/10/2014 and 04/10/2012.  FINDINGS: There is cortical atrophy and chronic microvascular ischemic change. No evidence of acute intracranial abnormality including hemorrhage, infarct, mass lesion, mass effect, midline shift or abnormal extra-axial fluid collection is identified. No hydrocephalus or pneumocephalus. The calvarium is intact.  IMPRESSION: No acute abnormality.  Atrophy and chronic microvascular ischemic change.   Electronically Signed   By: Drusilla Kanner M.D.   On: 08/27/2014 14:45   Mr Brain Wo Contrast  08/29/2014   CLINICAL DATA:  Last seen normal this morning on en route to adult day care. Now leaning toward the RIGHT. History of late stage dementia, diabetes, anemia and hypercholesterolemia.  EXAM: MRI HEAD WITHOUT CONTRAST  TECHNIQUE: Multiplanar, multiecho pulse sequences of  the brain and surrounding structures were obtained without intravenous contrast.  COMPARISON:  CT head August 27, 2014  FINDINGS: Moderate to severely motion degraded examination, per technologist note, patient received maximum sedation.  No reduced diffusion to suggest acute ischemia. Faint symmetric bifrontal susceptibility artifact. No evidence of lobar hematoma. Ventricles and sulci are normal for patient's age. Patchy supratentorial white matter T2 hyperintensities without midline shift, mass effect or mass lesions.  No abnormal extra-axial fluid collections. Normal major intracranial vascular flow voids seen at the skull base. Status post bilateral ocular lens implants. Trace paranasal sinus mucosal thickening without air-fluid levels. The mastoid air cells are well aerated. No abnormal sellar expansion. No cerebellar tonsillar ectopia. No suspicious calvarial bone marrow signal though the T1 sequences are moderate to severely motion degraded.  IMPRESSION: Moderate to severely motion degraded examination without acute intracranial process.  Mild bifrontal susceptibility artifact favors motion artifact, less likely sequelae of mineralization.  Involutional changes. At least mild to moderate white matter changes compatible chronic small vessel ischemic disease.   Electronically Signed   By: Awilda Metro M.D.   On: 08/29/2014 03:30    Joanna Puff, MD 08/29/2014, 9:06 AM PGY-1, [place] [facility] FPTS Intern pager:  [phone], text pages welcome

## 2014-08-29 NOTE — Clinical Social Work Note (Signed)
CSW spoke with [facility] and they can take patient tomorrow, once she has been without a sitter for 24 hours and the result of her chest x-ray to clear her for tuberculosis.  CSW informed bedside nurse who requested orders for sitter to be discharged.  CSW contacted patient's daughter [name] to give her an update on discharge planning and gave her the phone number for the weekend social worker who will be covering patient tomorrow.  Ervin Knack. Juliza Machnik, MSW, Theresia Majors [phone] 08/29/2014 2:04 PM

## 2014-08-29 NOTE — Progress Notes (Signed)
Sitter was DC'd at noon.  Patient is pleasant, resting and is not impulsive or combative.

## 2014-08-29 NOTE — Progress Notes (Signed)
NEURO HOSPITALIST PROGRESS NOTE   SUBJECTIVE:                                                                                                                        No new neurological developments. Resting in bed, able to eat breakfast. MRI brain was personally reviewed and showed no acute abnormality.  OBJECTIVE:                                                                                                                           Vital signs in last 24 hours: Temp:  [98.2 F (36.8 C)-99.5 F (37.5 C)] 98.2 F (36.8 C) (06/25 0600) Pulse Rate:  [60-81] 60 (06/25 0600) Resp:  [18-20] 18 (06/25 0600) BP: (85-149)/(42-68) 122/58 mmHg (06/25 0600) SpO2:  [98 %-100 %] 100 % (06/25 0600) Weight:  [48.44 kg (106 lb 12.7 oz)] 48.44 kg (106 lb 12.7 oz) (06/25 0300)  Intake/Output from previous day: 06/24 0701 - 06/25 0700 In: 940 [P.O.:940] Out: -  Intake/Output this shift:   Nutritional status: DIET DYS 3 Room service appropriate?: Yes; Fluid consistency:: Thin  Past Medical History  Diagnosis Date  . Dementia   . Diabetes mellitus   . Hypercholesteremia   . Anemia, unspecified 05/28/2013  Physical exam: HEENT- Normocephalic, no lesions, without obvious abnormality. Normal external eye and conjunctiva. Normal TM's bilaterally. Normal auditory canals and external ears. Normal external nose, mucus membranes and septum. Normal pharynx. Cardiovascular- S1, S2 normal, pulses palpable throughout  Lungs- chest clear, no wheezing, rales, normal symmetric air entry Abdomen- normal findings: bowel sounds normal Extremities- no edema Lymph-no adenopathy palpable Musculoskeletal-no joint tenderness, deformity or swelling Skin-warm and dry, no hyperpigmentation, vitiligo, or suspicious lesions  Neurologic Exam:  Mental Status: Alert and awake, but disoriented x 4. Speech fluent without evidence of aphasia. Able to follow simple commands  without difficulty. Cranial Nerves: II: Discs flat bilaterally; Visual fields grossly normal, pupils equal, round, reactive to light and accommodation III,IV, VI: ptosis not present, extra-ocular motions intact bilaterally V,VII: smile symmetric, facial light touch sensation normal bilaterally VIII: hearing normal bilaterally IX,X: uvula rises symmetrically XI: bilateral shoulder shrug XII: midline tongue extension Motor: Moving all extremities antigravity with no focal weakness.  Tone is  diffusely increased.  Sensory: Pinprick and light touch intact throughout, bilaterally Deep Tendon Reflexes: 2+ and symmetric throughout UE and 1+ in KJ no AJ bilaterally Plantars: Right: downgoingLeft: downgoing Cerebellar: normal finger-to-nose, Gait: not tested due to safety  Lab Results: Lab Results  Component Value Date/Time   CHOL 204* 12/18/2012 11:16 AM   Lipid Panel No results for input(s): CHOL, TRIG, HDL, CHOLHDL, VLDL, LDLCALC in the last 72 hours.  Studies/Results: Ct Head (brain) Wo Contrast  08/27/2014   CLINICAL DATA:  The patient is leaning to the right today. Initial encounter.  EXAM: CT HEAD WITHOUT CONTRAST  TECHNIQUE: Contiguous axial images were obtained from the base of the skull through the vertex without intravenous contrast.  COMPARISON:  Head CT scan 06/10/2014 and 04/10/2012.  FINDINGS: There is cortical atrophy and chronic microvascular ischemic change. No evidence of acute intracranial abnormality including hemorrhage, infarct, mass lesion, mass effect, midline shift or abnormal extra-axial fluid collection is identified. No hydrocephalus or pneumocephalus. The calvarium is intact.  IMPRESSION: No acute abnormality.  Atrophy and chronic microvascular ischemic change.   Electronically Signed   By: Drusilla Kanner M.D.   On: 08/27/2014 14:45   Mr Brain Wo Contrast  08/29/2014   CLINICAL DATA:  Last seen normal this morning on en route to  adult day care. Now leaning toward the RIGHT. History of late stage dementia, diabetes, anemia and hypercholesterolemia.  EXAM: MRI HEAD WITHOUT CONTRAST  TECHNIQUE: Multiplanar, multiecho pulse sequences of the brain and surrounding structures were obtained without intravenous contrast.  COMPARISON:  CT head August 27, 2014  FINDINGS: Moderate to severely motion degraded examination, per technologist note, patient received maximum sedation.  No reduced diffusion to suggest acute ischemia. Faint symmetric bifrontal susceptibility artifact. No evidence of lobar hematoma. Ventricles and sulci are normal for patient's age. Patchy supratentorial white matter T2 hyperintensities without midline shift, mass effect or mass lesions.  No abnormal extra-axial fluid collections. Normal major intracranial vascular flow voids seen at the skull base. Status post bilateral ocular lens implants. Trace paranasal sinus mucosal thickening without air-fluid levels. The mastoid air cells are well aerated. No abnormal sellar expansion. No cerebellar tonsillar ectopia. No suspicious calvarial bone marrow signal though the T1 sequences are moderate to severely motion degraded.  IMPRESSION: Moderate to severely motion degraded examination without acute intracranial process.  Mild bifrontal susceptibility artifact favors motion artifact, less likely sequelae of mineralization.  Involutional changes. At least mild to moderate white matter changes compatible chronic small vessel ischemic disease.   Electronically Signed   By: Awilda Metro M.D.   On: 08/29/2014 03:30    MEDICATIONS                                                                                                                        Scheduled: . aspirin  81 mg Oral Daily  . carbidopa-levodopa  0.5 tablet Oral BID  . citalopram  10 mg Oral Daily  . donepezil  10 mg  Oral QHS  . enoxaparin (LOVENOX) injection  30 mg Subcutaneous Q24H  . insulin aspart  0-9 Units  Subcutaneous TID WC  . pneumococcal 23 valent vaccine  0.5 mL Intramuscular Tomorrow-1000    ASSESSMENT/PLAN:                                                                                                            80 y.o. Female brought in as a code stroke  with 10 week history of bilateral LE weakness with increased weakness noted the day of admission during adult day care. On arrival to ED symptoms had resolved. tPA was not administered as she was out of window and symptoms had resolved. MRI brain without acute abnormality. Patient with diffuse body rigidity and question of PD versus medication induced parkinsonism (patient was on Risperdal at home and D/C on this admission) raised by the primary team. I agree with discontinuing Risperdal, as there is not data confirming the benefit of antipsychotics in the management of behavioral manifestations of AD, and long term use has been associated with increased cardiovascular mortality in this particular population.  Patient diffuse body rigidity on exam most likely a manifestation of advanced underlying disease, as extrapyramidal signs among patients with late state AD is not uncommon (up to 92% in some series) and typically Levodopa confers very limited benefit. Once again, agree with stopping antipsychotics due to the potential to induce or exacerbate parkinsonism. Neurology will sign off.    Wyatt Portela, MD [facility] [phone]  08/29/2014, 9:08 AM

## 2014-08-30 DIAGNOSIS — R29898 Other symptoms and signs involving the musculoskeletal system: Secondary | ICD-10-CM | POA: Diagnosis not present

## 2014-08-30 DIAGNOSIS — R531 Weakness: Secondary | ICD-10-CM | POA: Diagnosis not present

## 2014-08-30 DIAGNOSIS — F0391 Unspecified dementia with behavioral disturbance: Secondary | ICD-10-CM | POA: Diagnosis not present

## 2014-08-30 LAB — GLUCOSE, CAPILLARY
Glucose-Capillary: 123 mg/dL — ABNORMAL HIGH (ref 65–99)
Glucose-Capillary: 105 mg/dL — ABNORMAL HIGH (ref 65–99)

## 2014-08-30 MED ORDER — BOOST PLUS PO LIQD: 237.0000 mL | ORAL | Status: AC

## 2014-08-30 MED ORDER — CARBIDOPA-LEVODOPA 25-100 MG PO TABS: 0.5000 | ORAL_TABLET | Freq: Two times a day (BID) | ORAL | Status: DC

## 2014-08-30 MED ORDER — TRAMADOL HCL 50 MG PO TABS: 50.0000 mg | ORAL_TABLET | Freq: Four times a day (QID) | ORAL | Status: DC | PRN

## 2014-08-30 MED ORDER — ASPIRIN 81 MG PO CHEW: 81.0000 mg | CHEWABLE_TABLET | Freq: Every day | ORAL | Status: AC

## 2014-08-30 MED ORDER — ACETAMINOPHEN 325 MG PO TABS: 650.0000 mg | ORAL_TABLET | Freq: Four times a day (QID) | ORAL | Status: AC | PRN

## 2014-08-30 NOTE — Discharge Instructions (Signed)
Suspect that weakness and rigidity symptoms were due to Risperdal medication. This can cause similar side effects to Parkinson's Disease. Alternatively, she may also have a form of Parkinson's disease that was just made worse by this medication. Trial on Levodopa medicine to help her symptoms, if not helping after 1 month can discontinue. We have discontinued this medication. Neurology has evaluated patient and no further treatments. Recommend avoid further anti-psychotic medications. If significant worsening may consider evaluating for trial on Seroquel, however this has additional risks as well, especially in Alzheimer's Dementia patient.  Continue Dysphagia 3 diet / ground meats / thin liquids

## 2014-08-30 NOTE — Progress Notes (Signed)
Discharge orders received, pt for discharge today to [facility].  IV and telemetry D/C.  D/C instructions and Rx in packet for receiving facility and report called to SNF.  Family at the bedside to assist with discharge. PTAR brought pt downstairs via stretcher.

## 2014-08-30 NOTE — Progress Notes (Signed)
LCSW made aware that patient is ready for DC today to [facility]. [facility] called, aware of patient and agreeable to accept. DC summary sent on CFP for review. LCSW completed packet for patient for SNF.  LCSW to contact family for DC.  Deretha Emory, MSW Clinical Social Work: Emergency Room [phone]

## 2014-08-31 ENCOUNTER — Non-Acute Institutional Stay (SKILLED_NURSING_FACILITY): Payer: Medicare Other | Admitting: Adult Health

## 2014-08-31 ENCOUNTER — Encounter: Payer: Self-pay | Admitting: Adult Health

## 2014-08-31 DIAGNOSIS — E118 Type 2 diabetes mellitus with unspecified complications: Secondary | ICD-10-CM | POA: Diagnosis not present

## 2014-08-31 DIAGNOSIS — D638 Anemia in other chronic diseases classified elsewhere: Secondary | ICD-10-CM | POA: Diagnosis not present

## 2014-08-31 DIAGNOSIS — R5381 Other malaise: Secondary | ICD-10-CM

## 2014-08-31 DIAGNOSIS — F0391 Unspecified dementia with behavioral disturbance: Secondary | ICD-10-CM

## 2014-08-31 DIAGNOSIS — I2510 Atherosclerotic heart disease of native coronary artery without angina pectoris: Secondary | ICD-10-CM

## 2014-08-31 DIAGNOSIS — R29898 Other symptoms and signs involving the musculoskeletal system: Secondary | ICD-10-CM

## 2014-08-31 DIAGNOSIS — F329 Major depressive disorder, single episode, unspecified: Secondary | ICD-10-CM

## 2014-08-31 DIAGNOSIS — F32A Depression, unspecified: Secondary | ICD-10-CM

## 2014-08-31 NOTE — Progress Notes (Signed)
Patient ID: Marcia Campbell, female   DOB: [birth year], 81 y.o.   MRN: [id number]   08/31/2014  Facility:  Nursing Home Location:  [facility] Room Number: 1104-2 LEVEL OF CARE:  SNF (31)   Chief Complaint  Patient presents with  . Hospitalization Follow-up    Physical deconditioning, rigidity, CAD, depression, dementia, diabetes mellitus and anemia    HISTORY OF PRESENT ILLNESS:  This is an 81 year old female who was been admitted to [facility] on 08/30/14 from [facility]. She has PMH of dementia, depression, hypercholesterolemia and diabetes mellitus. She presented to the ED with acute bilateral lower extremity weakness and noted to be slouching more to the right at the adult daycare that she attends. CT of the head did not reveal any acute changes. She failed a bedside swallow, started on IVFs and placed on nothing by mouth. She was noted to have masked face, bradykinesia and muscular rigidity. MRI did not reveal any abnormality. She was started on Sinemet for symptomatic relief of rigidity with some improvements. Neurology discontinued Risperdal. It was recommended that if she develops nausea and concerned for safety, could consider Seroquel and avoid Risperdal. If bradykinesia and rigidity improves in the next 4 weeks, discontinue Sinemet.  She has been admitted for a short-term rehabilitation.  PAST MEDICAL HISTORY:  Past Medical History  Diagnosis Date  . Dementia   . Diabetes mellitus   . Hypercholesteremia   . Anemia, unspecified 05/28/2013    CURRENT MEDICATIONS: Reviewed per MAR/see medication list  Allergies  Allergen Reactions  . Penicillins Itching     REVIEW OF SYSTEMS:  Unable to obtain due to advanced dementia  PHYSICAL EXAMINATION  GENERAL: no acute distress, normal body habitus EYES: conjunctivae normal, sclerae normal, normal eye lids NECK: supple, trachea midline, no neck masses, no thyroid tenderness, no  thyromegaly LYMPHATICS: no LAN in the neck, no supraclavicular LAN RESPIRATORY: breathing is even & unlabored, BS CTAB CARDIAC: RRR, no murmur,no extra heart sounds, no edema GI: abdomen soft, normal BS, no masses, no tenderness, no hepatomegaly, no splenomegaly EXTREMITIES:  Able to move 4 extremities PSYCHIATRIC: the patient is alert & oriented to person, affect & behavior appropriate  LABS/RADIOLOGY: Labs reviewed: Basic Metabolic Panel:  Recent Labs  56/21/30 1400 06/10/14 2227 08/27/14 1450 08/27/14 1454  NA 135 140 141 141  K 4.3 4.1 4.1 4.0  CL 102 104 107 107  CO2 --   GLUCOSE 155* 102* 87 92  BUN CREATININE 0.95 1.01 1.03* 1.00  CALCIUM 9.2 9.6 9.3  --    Liver Function Tests:  Recent Labs  12/25/13 1400 06/10/14 2227 08/27/14 1450  AST ALT 36* 19 23  ALKPHOS 52 49 52  BILITOT 0.5 1.1 0.9  PROT 6.6 6.8 6.6  ALBUMIN 3.7 3.7 3.4*   CBC:  Recent Labs  06/10/14 2227 08/27/14 1343 08/27/14 1450 08/27/14 1454  WBC 5.4 6.0 5.4  --   NEUTROABS 3.1  --  3.7  --   HGB 10.6* 10.5* 10.3* 11.2*  HCT 32.9* 32.8* 31.1* 33.0*  MCV 94.5 93.5 93.7  --   PLT 217  --  206  --    Cardiac Enzymes:  Recent Labs  06/10/14 2227  TROPONINI <0.03   CBG:  Recent Labs  08/29/14 2113 08/30/14 0645 08/30/14 1134  GLUCAP 118* 123* 105*     Dg Chest 2 View  08/29/2014  CLINICAL DATA:  Ppd positive.  EXAM: CHEST  2 VIEW  COMPARISON:  None.  FINDINGS: Lungs are adequately inflated without consolidation or effusion. There is borderline cardiomegaly. Minimal degenerative change of the spine.  IMPRESSION: No active cardiopulmonary disease.   Electronically Signed   By: Elberta Fortis M.D.   On: 08/29/2014 13:55   Ct Head (brain) Wo Contrast  08/27/2014   CLINICAL DATA:  The patient is leaning to the right today. Initial encounter.  EXAM: CT HEAD WITHOUT CONTRAST  TECHNIQUE: Contiguous axial images were obtained from the base of the  skull through the vertex without intravenous contrast.  COMPARISON:  Head CT scan 06/10/2014 and 04/10/2012.  FINDINGS: There is cortical atrophy and chronic microvascular ischemic change. No evidence of acute intracranial abnormality including hemorrhage, infarct, mass lesion, mass effect, midline shift or abnormal extra-axial fluid collection is identified. No hydrocephalus or pneumocephalus. The calvarium is intact.  IMPRESSION: No acute abnormality.  Atrophy and chronic microvascular ischemic change.   Electronically Signed   By: Drusilla Kanner M.D.   On: 08/27/2014 14:45   Mr Brain Wo Contrast  08/29/2014   CLINICAL DATA:  Last seen normal this morning on en route to adult day care. Now leaning toward the RIGHT. History of late stage dementia, diabetes, anemia and hypercholesterolemia.  EXAM: MRI HEAD WITHOUT CONTRAST  TECHNIQUE: Multiplanar, multiecho pulse sequences of the brain and surrounding structures were obtained without intravenous contrast.  COMPARISON:  CT head August 27, 2014  FINDINGS: Moderate to severely motion degraded examination, per technologist note, patient received maximum sedation.  No reduced diffusion to suggest acute ischemia. Faint symmetric bifrontal susceptibility artifact. No evidence of lobar hematoma. Ventricles and sulci are normal for patient's age. Patchy supratentorial white matter T2 hyperintensities without midline shift, mass effect or mass lesions.  No abnormal extra-axial fluid collections. Normal major intracranial vascular flow voids seen at the skull base. Status post bilateral ocular lens implants. Trace paranasal sinus mucosal thickening without air-fluid levels. The mastoid air cells are well aerated. No abnormal sellar expansion. No cerebellar tonsillar ectopia. No suspicious calvarial bone marrow signal though the T1 sequences are moderate to severely motion degraded.  IMPRESSION: Moderate to severely motion degraded examination without acute intracranial  process.  Mild bifrontal susceptibility artifact favors motion artifact, less likely sequelae of mineralization.  Involutional changes. At least mild to moderate white matter changes compatible chronic small vessel ischemic disease.   Electronically Signed   By: Awilda Metro M.D.   On: 08/29/2014 03:30    ASSESSMENT/PLAN:  Physical deconditioning - for rehabilitation  Rigidity - continue Sinemet 25-100 mg 1/2 tab by mouth twice a day  CAD - stable; continue aspirin 81 mg by mouth daily  Depression - mood is stable; continue Celexa 20 mg by mouth daily  Dementia - advanced; continue Aricept 10 mg 1 tab by mouth daily at bedtime  Diabetes mellitus, type II - hemoglobin A1c 6.8; continue Lantus 12 units subcutaneous daily at bedtime  Anemia of chronic disease - hemoglobin 10.3; will monitor   Goals of care:  Short-term rehabilitation   Labs/test ordered:  CBC and BMP   Spent 50 minutes in patient care.     [facility], NP [employer] [phone]

## 2014-09-01 ENCOUNTER — Non-Acute Institutional Stay (SKILLED_NURSING_FACILITY): Payer: Medicare Other | Admitting: Internal Medicine

## 2014-09-01 DIAGNOSIS — R5381 Other malaise: Secondary | ICD-10-CM | POA: Diagnosis not present

## 2014-09-01 DIAGNOSIS — F0280 Dementia in other diseases classified elsewhere without behavioral disturbance: Secondary | ICD-10-CM

## 2014-09-01 DIAGNOSIS — E118 Type 2 diabetes mellitus with unspecified complications: Secondary | ICD-10-CM

## 2014-09-01 DIAGNOSIS — R259 Unspecified abnormal involuntary movements: Secondary | ICD-10-CM

## 2014-09-01 DIAGNOSIS — G309 Alzheimer's disease, unspecified: Secondary | ICD-10-CM | POA: Diagnosis not present

## 2014-09-01 DIAGNOSIS — F329 Major depressive disorder, single episode, unspecified: Secondary | ICD-10-CM

## 2014-09-01 DIAGNOSIS — E46 Unspecified protein-calorie malnutrition: Secondary | ICD-10-CM | POA: Diagnosis not present

## 2014-09-01 DIAGNOSIS — G219 Secondary parkinsonism, unspecified: Secondary | ICD-10-CM

## 2014-09-01 DIAGNOSIS — F322 Major depressive disorder, single episode, severe without psychotic features: Secondary | ICD-10-CM | POA: Diagnosis not present

## 2014-09-01 DIAGNOSIS — D638 Anemia in other chronic diseases classified elsewhere: Secondary | ICD-10-CM | POA: Diagnosis not present

## 2014-09-01 NOTE — Progress Notes (Signed)
Patient ID: Marcia Campbell, female   DOB: [birth year], 81 y.o.   MRN: [id number]     [facility]   PCP: DOOLITTLE, Harrel Lemon, MD  Code Status: full code  Allergies  Allergen Reactions  . Penicillins Itching    Chief Complaint  Patient presents with  . New Admit To SNF     HPI:  81 year old patient is here for short term rehabilitation post hospital admission from 08/27/14-08/30/14 with acute onset bilateral lower extremity weakness. CT head and mri brain did not show any acute abnormalities. There were concerns for risperdal induced parkinsonism. She was started on sinemet and risperdal was discontinued. She was also treated for uti. She is seen in her room today with her daughter at bedside. She is unable to participate in her HPI and ROS. Daughter mentions to have noticed intermittent tremors in her right hand. No falls reported in the facility, but during conversation. Patient is intermittently trying to lean forward and get up from her wheelchair. She follows few simple commands. She has history of dementia,dm type 2, iron def anemia among others  Review of Systems:  Unable to obtain  Past Medical History  Diagnosis Date  . Dementia   . Diabetes mellitus   . Hypercholesteremia   . Anemia, unspecified 05/28/2013   Past Surgical History  Procedure Laterality Date  . Bowel resection    . Abdominal hysterectomy    . Breast reduction surgery     Social History:   reports that she has never smoked. She has never used smokeless tobacco. She reports that she does not drink alcohol or use illicit drugs.  Family History  Problem Relation Age of Onset  . Diabetes Sister   . Cancer Daughter     Medications: Patient's Medications  New Prescriptions   No medications on file  Previous Medications   ACETAMINOPHEN (TYLENOL) 325 MG TABLET    Take 2 tablets (650 mg total) by mouth every 6 (six) hours as needed for mild pain (temperature >/= 99.5  F).   ASPIRIN 81 MG CHEWABLE TABLET    Chew 1 tablet (81 mg total) by mouth daily.   CARBIDOPA-LEVODOPA (SINEMET IR) 25-100 MG PER TABLET    Take 0.5 tablets by mouth 2 (two) times daily.   CITALOPRAM (CELEXA) 20 MG TABLET    Take 1 tablet (20 mg total) by mouth daily. Take 1/2 tablet daily.   DONEPEZIL (ARICEPT) 10 MG TABLET    TAKE 1 TABLET BY MOUTH EVERY NIGHT AT BEDTIME   INSULIN GLARGINE (LANTUS SOLOSTAR) 100 UNIT/ML SOLOSTAR PEN    Inject 12 units into the skin at bedtime.   LACTOSE FREE NUTRITION (BOOST PLUS) LIQD    Take 237 mLs by mouth daily.   LOPERAMIDE (IMODIUM) 2 MG CAPSULE    Take 1 capsule (2 mg total) by mouth as needed for diarrhea or loose stools. Not to exceed 8 mg/day   TRAMADOL (ULTRAM) 50 MG TABLET    Take 1 tablet (50 mg total) by mouth every 6 (six) hours as needed.  Modified Medications   No medications on file  Discontinued Medications   No medications on file     Physical Exam: Filed Vitals:   09/01/14 1341  BP: 118/62  Pulse: 68  Temp: 98.2 F (36.8 C)  Resp: 16  Height: 5' (1.524 m)  Weight: 101 lb (45.813 kg)  SpO2: 98%   Body mass index is 19.73 kg/(m^2).  General- elderly female,  frail, thin built, in no acute distress Head- normocephalic, atraumatic Throat- moist mucus membrane, upper dentures  Eyes- PERRLA, EOMI, no pallor, no icterus, no discharge, normal conjunctiva, normal sclera Neck- no cervical lymphadenopathy Cardiovascular- normal s1,s2, no murmurs, palpable dorsalis pedis, no leg edema Respiratory- bilateral clear to auscultation, no wheeze, no rhonchi, no crackles, no use of accessory muscles Abdomen- bowel sounds present, soft, non tender Musculoskeletal- able to move all 4 extremities, rigidity in both upper and lower extremities noted, generalized weakness, on wheelchair Neurological- alert and oriented to person only, right hand involuntary tremor lasting for few seconds Skin- warm and dry Psychiatry- unable to assess mood,  calm this visit    Labs reviewed: Basic Metabolic Panel:  Recent Labs  40/98/1110/22/15 1400 06/10/14 2227 08/27/14 1450 08/27/14 1454  NA 135 140 141 141  K 4.3 4.1 4.1 4.0  CL 102 104 107 107  CO2 25 26 26   --   GLUCOSE 155* 102* 87 92  BUN 20 18 13 18   CREATININE 0.95 1.01 1.03* 1.00  CALCIUM 9.2 9.6 9.3  --    Liver Function Tests:  Recent Labs  12/25/13 1400 06/10/14 2227 08/27/14 1450  AST 22 29 29   ALT 36* 19 23  ALKPHOS 52 49 52  BILITOT 0.5 1.1 0.9  PROT 6.6 6.8 6.6  ALBUMIN 3.7 3.7 3.4*   No results for input(s): LIPASE, AMYLASE in the last 8760 hours. No results for input(s): AMMONIA in the last 8760 hours. CBC:  Recent Labs  06/10/14 2227 08/27/14 1343 08/27/14 1450 08/27/14 1454  WBC 5.4 6.0 5.4  --   NEUTROABS 3.1  --  3.7  --   HGB 10.6* 10.5* 10.3* 11.2*  HCT 32.9* 32.8* 31.1* 33.0*  MCV 94.5 93.5 93.7  --   PLT 217  --  206  --    Cardiac Enzymes:  Recent Labs  06/10/14 2227  TROPONINI <0.03   BNP: Invalid input(s): POCBNP CBG:  Recent Labs  08/29/14 2113 08/30/14 0645 08/30/14 1134  GLUCAP 118* 123* 105*    Radiological Exams:  Dg Chest 2 View  08/29/2014   CLINICAL DATA:  Ppd positive.  EXAM: CHEST  2 VIEW  COMPARISON:  None.  FINDINGS: Lungs are adequately inflated without consolidation or effusion. There is borderline cardiomegaly. Minimal degenerative change of the spine.  IMPRESSION: No active cardiopulmonary disease.   Electronically Signed   By: Elberta Fortisaniel  Boyle M.D.   On: 08/29/2014 13:55   Ct Head (brain) Wo Contrast  08/27/2014   CLINICAL DATA:  The patient is leaning to the right today. Initial encounter.  EXAM: CT HEAD WITHOUT CONTRAST  TECHNIQUE: Contiguous axial images were obtained from the base of the skull through the vertex without intravenous contrast.  COMPARISON:  Head CT scan 06/10/2014 and 04/10/2012.  FINDINGS: There is cortical atrophy and chronic microvascular ischemic change. No evidence of acute  intracranial abnormality including hemorrhage, infarct, mass lesion, mass effect, midline shift or abnormal extra-axial fluid collection is identified. No hydrocephalus or pneumocephalus. The calvarium is intact.  IMPRESSION: No acute abnormality.  Atrophy and chronic microvascular ischemic change.   Electronically Signed   By: Drusilla Kannerhomas  Dalessio M.D.   On: 08/27/2014 14:45   Mr Brain Wo Contrast  08/29/2014   CLINICAL DATA:  Last seen normal this morning on en route to adult day care. Now leaning toward the RIGHT. History of late stage dementia, diabetes, anemia and hypercholesterolemia.  EXAM: MRI HEAD WITHOUT CONTRAST  TECHNIQUE: Multiplanar, multiecho pulse  sequences of the brain and surrounding structures were obtained without intravenous contrast.  COMPARISON:  CT head August 27, 2014  FINDINGS: Moderate to severely motion degraded examination, per technologist note, patient received maximum sedation.  No reduced diffusion to suggest acute ischemia. Faint symmetric bifrontal susceptibility artifact. No evidence of lobar hematoma. Ventricles and sulci are normal for patient's age. Patchy supratentorial white matter T2 hyperintensities without midline shift, mass effect or mass lesions.  No abnormal extra-axial fluid collections. Normal major intracranial vascular flow voids seen at the skull base. Status post bilateral ocular lens implants. Trace paranasal sinus mucosal thickening without air-fluid levels. The mastoid air cells are well aerated. No abnormal sellar expansion. No cerebellar tonsillar ectopia. No suspicious calvarial bone marrow signal though the T1 sequences are moderate to severely motion degraded.  IMPRESSION: Moderate to severely motion degraded examination without acute intracranial process.  Mild bifrontal susceptibility artifact favors motion artifact, less likely sequelae of mineralization.  Involutional changes. At least mild to moderate white matter changes compatible chronic small vessel  ischemic disease.   Electronically Signed   By: Awilda Metro M.D.   On: 08/29/2014 03:30    Assessment/Plan  Physical deconditioning Will have her work with physical therapy and occupational therapy team to help with gait training and muscle strengthening exercises.fall precautions. Skin care. Encourage to be out of bed.   Parkinsonism Has parkinsonism features, on sinemet 25-100 half tablet bid, continue this for now, if no improvement in rigidity and weakness over a week, will discontinue this  Involuntary movement In right arm, daughter has noticed it in other extremities as well, likely side effect of sinemet which is a new med for her. Start cogentin 1 mg bid and reassess  alzhimer's dementia No behavioral disturbance, continue aricept 10 mg daily. Decline anticipated. Monitor weight. To provide assistance with ADLs. Pressure ulcer prophylaxis. High fall risk, will get geri chair  Depression Stable, continue celexa 10 mg daily  Protein calorie malnutrition Monitor po intake, will need complete assistance with feed. Continue medpass supplement, monitor weight  Diabetes mellitus, type II hemoglobin A1c 6.8. continue Lantus 12 units daily and monitor cbg  Anemia of chronic disease hemoglobin 10.3. Monitor h&h   Goals of care: short term rehabilitation   Labs/tests ordered: cbc, bmp  Family/ staff Communication: reviewed care plan with patient's daughter and nursing supervisor    Oneal Grout, MD  [facility] [phone] (Monday-Friday 8 am - 5 pm) [phone] (afterhours)

## 2014-09-07 ENCOUNTER — Other Ambulatory Visit
Admission: RE | Admit: 2014-09-07 | Discharge: 2014-09-07 | Disposition: A | Discharge status: Home / Self Care | Payer: Medicare Other | Source: Skilled Nursing Facility | Attending: Adult Health | Admitting: Adult Health

## 2014-09-07 DIAGNOSIS — R5383 Other fatigue: Secondary | ICD-10-CM | POA: Diagnosis present

## 2014-09-07 DIAGNOSIS — R410 Disorientation, unspecified: Secondary | ICD-10-CM | POA: Diagnosis present

## 2014-09-07 LAB — URINALYSIS COMPLETE WITH MICROSCOPIC (ARMC ONLY)
BILIRUBIN URINE: NEGATIVE
Bacteria, UA: NONE SEEN
Glucose, UA: NEGATIVE mg/dL
Ketones, ur: NEGATIVE mg/dL
NITRITE: NEGATIVE
Protein, ur: NEGATIVE mg/dL
Specific Gravity, Urine: 1.008 (ref 1.005–1.030)
pH: 6 (ref 5.0–8.0)

## 2014-09-08 LAB — URINE CULTURE

## 2014-09-16 ENCOUNTER — Encounter: Payer: Self-pay | Admitting: Adult Health

## 2014-09-16 ENCOUNTER — Non-Acute Institutional Stay (SKILLED_NURSING_FACILITY): Payer: Medicare Other | Admitting: Adult Health

## 2014-09-16 DIAGNOSIS — F0391 Unspecified dementia with behavioral disturbance: Secondary | ICD-10-CM | POA: Diagnosis not present

## 2014-09-16 DIAGNOSIS — D638 Anemia in other chronic diseases classified elsewhere: Secondary | ICD-10-CM

## 2014-09-16 DIAGNOSIS — F329 Major depressive disorder, single episode, unspecified: Secondary | ICD-10-CM

## 2014-09-16 DIAGNOSIS — I2510 Atherosclerotic heart disease of native coronary artery without angina pectoris: Secondary | ICD-10-CM

## 2014-09-16 DIAGNOSIS — F322 Major depressive disorder, single episode, severe without psychotic features: Secondary | ICD-10-CM | POA: Diagnosis not present

## 2014-09-16 DIAGNOSIS — R29898 Other symptoms and signs involving the musculoskeletal system: Secondary | ICD-10-CM

## 2014-09-16 DIAGNOSIS — R5381 Other malaise: Secondary | ICD-10-CM | POA: Diagnosis not present

## 2014-09-17 NOTE — Progress Notes (Signed)
Patient ID: Marcia Campbell, female   DOB: 1921/04/25, 81 y.o.   MRN: [id number]   09/16/14  Facility:  Nursing Home Location:  [facility] Room Number: 1104-2 LEVEL OF CARE:  SNF (31)   Chief Complaint  Patient presents with  . Discharge Note    Physical deconditioning, rigidity, CAD, depression, dementia, diabetes mellitus and anemia    HISTORY OF PRESENT ILLNESS:  This is an 81 year old female who is for discharge home  With Home health PT. [facility]. She has PMH of dementia, depression, hypercholesterolemia and diabetes mellitus. She presented to the ED with acute bilateral lower extremity weakness and noted to be slouching more to the right at the adult daycare that she attends. CT of the head did not reveal any acute changes. She failed a bedside swallow, started on IVFs and placed on nothing by mouth. She was noted to have masked face, bradykinesia and muscular rigidity. MRI did not reveal any abnormality. She was started on Sinemet for symptomatic relief of rigidity with some improvements. Neurology discontinued Risperdal.   Her mood was stable and did not have agitation during her [facility].  Patient was admitted to this facility for short-term rehabilitation after the patient's recent hospitalization.  Patient has completed SNF rehabilitation and therapy has cleared the patient for discharge.  PAST MEDICAL HISTORY:  Past Medical History  Diagnosis Date  . Dementia   . Diabetes mellitus   . Hypercholesteremia   . Anemia, unspecified 05/28/2013    CURRENT MEDICATIONS: Reviewed per MAR/see medication list  Allergies  Allergen Reactions  . Penicillins Itching     REVIEW OF SYSTEMS:  Unable to obtain due to advanced dementia  PHYSICAL EXAMINATION  GENERAL: no acute distress, normal body habitus NECK: supple, trachea midline, no neck masses, no thyroid  tenderness, no thyromegaly LYMPHATICS: no LAN in the neck, no supraclavicular LAN RESPIRATORY: breathing is even & unlabored, BS CTAB CARDIAC: RRR, no murmur,no extra heart sounds, no edema GI: abdomen soft, normal BS, no masses, no tenderness, no hepatomegaly, no splenomegaly EXTREMITIES:  Able to move 4 extremities PSYCHIATRIC: the patient is alert & oriented to person, affect & behavior appropriate  LABS/RADIOLOGY: Labs reviewed: 09/01/14  WBC 6.8 hemoglobin 10.4 hematocrit 31.0 MCV 92.5 platelet 247 sodium 147 potassium 4.1 glucose 145 BUN 25 creatinine 1.04 calcium 9.4 Basic Metabolic Panel:  Recent Labs  40/98/1110/22/15 1400 06/10/14 2227 08/27/14 1450 08/27/14 1454  NA 135 140 141 141  K 4.3 4.1 4.1 4.0  CL 102 104 107 107  CO2 25 26 26   --   GLUCOSE 155* 102* 87 92  BUN 20 18 13 18   CREATININE 0.95 1.01 1.03* 1.00  CALCIUM 9.2 9.6 9.3  --    Liver Function Tests:  Recent Labs  12/25/13 1400 06/10/14 2227 08/27/14 1450  AST 22 29 29   ALT 36* 19 23  ALKPHOS 52 49 52  BILITOT 0.5 1.1 0.9  PROT 6.6 6.8 6.6  ALBUMIN 3.7 3.7 3.4*   CBC:  Recent Labs  06/10/14 2227 08/27/14 1343 08/27/14 1450 08/27/14 1454  WBC 5.4 6.0 5.4  --   NEUTROABS 3.1  --  3.7  --   HGB 10.6* 10.5* 10.3* 11.2*  HCT 32.9* 32.8* 31.1* 33.0*  MCV 94.5 93.5 93.7  --   PLT 217  --  206  --    Cardiac Enzymes:  Recent Labs  06/10/14 2227  TROPONINI <0.03   CBG:  Recent Labs  08/29/14 2113 08/30/14 0645 08/30/14 1134  GLUCAP 118* 123* 105*     Dg Chest 2 View  08/29/2014   CLINICAL DATA:  Ppd positive.  EXAM: CHEST  2 VIEW  COMPARISON:  None.  FINDINGS: Lungs are adequately inflated without consolidation or effusion. There is borderline cardiomegaly. Minimal degenerative change of the spine.  IMPRESSION: No active cardiopulmonary disease.   Electronically Signed   By: Elberta Fortis M.D.   On: 08/29/2014 13:55   Ct Head (brain) Wo Contrast  08/27/2014   CLINICAL DATA:  The  patient is leaning to the right today. Initial encounter.  EXAM: CT HEAD WITHOUT CONTRAST  TECHNIQUE: Contiguous axial images were obtained from the base of the skull through the vertex without intravenous contrast.  COMPARISON:  Head CT scan 06/10/2014 and 04/10/2012.  FINDINGS: There is cortical atrophy and chronic microvascular ischemic change. No evidence of acute intracranial abnormality including hemorrhage, infarct, mass lesion, mass effect, midline shift or abnormal extra-axial fluid collection is identified. No hydrocephalus or pneumocephalus. The calvarium is intact.  IMPRESSION: No acute abnormality.  Atrophy and chronic microvascular ischemic change.   Electronically Signed   By: Drusilla Kanner M.D.   On: 08/27/2014 14:45   Mr Brain Wo Contrast  08/29/2014   CLINICAL DATA:  Last seen normal this morning on en route to adult day care. Now leaning toward the RIGHT. History of late stage dementia, diabetes, anemia and hypercholesterolemia.  EXAM: MRI HEAD WITHOUT CONTRAST  TECHNIQUE: Multiplanar, multiecho pulse sequences of the brain and surrounding structures were obtained without intravenous contrast.  COMPARISON:  CT head August 27, 2014  FINDINGS: Moderate to severely motion degraded examination, per technologist note, patient received maximum sedation.  No reduced diffusion to suggest acute ischemia. Faint symmetric bifrontal susceptibility artifact. No evidence of lobar hematoma. Ventricles and sulci are normal for patient's age. Patchy supratentorial white matter T2 hyperintensities without midline shift, mass effect or mass lesions.  No abnormal extra-axial fluid collections. Normal major intracranial vascular flow voids seen at the skull base. Status post bilateral ocular lens implants. Trace paranasal sinus mucosal thickening without air-fluid levels. The mastoid air cells are well aerated. No abnormal sellar expansion. No cerebellar tonsillar ectopia. No suspicious calvarial bone marrow  signal though the T1 sequences are moderate to severely motion degraded.  IMPRESSION: Moderate to severely motion degraded examination without acute intracranial process.  Mild bifrontal susceptibility artifact favors motion artifact, less likely sequelae of mineralization.  Involutional changes. At least mild to moderate white matter changes compatible chronic small vessel ischemic disease.   Electronically Signed   By: Awilda Metro M.D.   On: 08/29/2014 03:30    ASSESSMENT/PLAN:  Physical deconditioning - for Home health PT  Rigidity - continue Sinemet 25-100 mg 1/2 tab by mouth twice a day  CAD - stable; continue aspirin 81 mg by mouth daily  Depression - mood is stable; continue Celexa 20 mg by mouth daily  Dementia - advanced; continue Aricept 10 mg 1 tab by mouth daily at bedtime  Diabetes mellitus, type II - hemoglobin A1c 6.8; continue Lantus 12 units subcutaneous daily at bedtime  Anemia of chronic disease - hemoglobin 10.4; stable   I have filled out patient's discharge paperwork and written prescriptions.  Patient will receive home health PT.  Total discharge time: Less than 30 minutes  Discharge time involved coordination of the discharge process with Child psychotherapist, nursing staff and therapy  department. Medical justification for home health services verified.   [facility], NP [employer] [phone]

## 2014-09-19 ENCOUNTER — Other Ambulatory Visit: Payer: Self-pay | Admitting: Adult Health

## 2014-09-22 ENCOUNTER — Telehealth: Payer: Self-pay

## 2014-09-22 DIAGNOSIS — E119 Type 2 diabetes mellitus without complications: Secondary | ICD-10-CM | POA: Diagnosis not present

## 2014-09-22 DIAGNOSIS — R259 Unspecified abnormal involuntary movements: Secondary | ICD-10-CM | POA: Diagnosis not present

## 2014-09-22 DIAGNOSIS — G219 Secondary parkinsonism, unspecified: Secondary | ICD-10-CM | POA: Diagnosis not present

## 2014-09-22 DIAGNOSIS — G309 Alzheimer's disease, unspecified: Secondary | ICD-10-CM | POA: Diagnosis not present

## 2014-09-22 NOTE — Telephone Encounter (Signed)
Pt daughter called back wanting to talk with Delaney Meigsamara.  I advised her that Delaney Meigsamara was already gone for the day and she would be back on tomorrow morning at 830 am.  She wants to finish discussing care for her mother with Dr. Merla Richesoolittle and states Delaney Meigsamara has history with them and knows how to deal with this issues.  Daughter was very nice.

## 2014-09-22 NOTE — Telephone Encounter (Signed)
Pt is needing to  Talk with dr Merla Richesdoolittle about her nursing home release

## 2014-09-22 NOTE — Telephone Encounter (Signed)
Left message for pt to call back  °

## 2014-09-23 ENCOUNTER — Telehealth: Payer: Self-pay

## 2014-09-23 NOTE — Telephone Encounter (Signed)
Marcia Campbell with [facility] is letting you know that he needs regular speech oder and occupationl orders and his number is [phone]

## 2014-09-23 NOTE — Telephone Encounter (Signed)
Called number, unable to reach pt.

## 2014-09-23 NOTE — Telephone Encounter (Signed)
Is there something i need to sign??

## 2014-09-23 NOTE — Telephone Encounter (Signed)
Spoke with pt, daughter states the hospital referred her to [facility] for rehab. The discharge states she needs to see her PCP. I advised her that Dr. Merla Richesoolittle would be here at 10 so she can come in then. [name] agreed.

## 2014-09-25 ENCOUNTER — Ambulatory Visit (INDEPENDENT_AMBULATORY_CARE_PROVIDER_SITE_OTHER): Payer: Medicare Other | Admitting: Internal Medicine

## 2014-09-25 VITALS — BP 118/80 | HR 75 | Temp 98.2°F | Resp 16 | Ht 60.0 in

## 2014-09-25 DIAGNOSIS — R269 Unspecified abnormalities of gait and mobility: Secondary | ICD-10-CM | POA: Diagnosis not present

## 2014-09-25 DIAGNOSIS — R404 Transient alteration of awareness: Secondary | ICD-10-CM | POA: Diagnosis not present

## 2014-09-25 DIAGNOSIS — G4710 Hypersomnia, unspecified: Secondary | ICD-10-CM | POA: Diagnosis not present

## 2014-09-25 DIAGNOSIS — R29898 Other symptoms and signs involving the musculoskeletal system: Secondary | ICD-10-CM

## 2014-09-25 NOTE — Progress Notes (Addendum)
Subjective:  This chart was scribed for Marcia Sia, MD by Broadus John, Medical Scribe. This patient was seen in Room 2 and the patient's care was started at 12:25 PM.   Patient ID: Marcia Campbell, female    DOB: 01/07/1922, 81 y.o.   MRN: [id number]  HPI HPI Comments: Marcia Campbell is a 82 y.o. female who presents to [facility] with her daughter for a follow up.   Pt was admitted to the ED recently where she was presented with bilateral lower extrimity weakness and there was a concern for a CVA, however tests were all negative for stroke. Pt was recently admitted to a nursing home for rehabilitation. Pt has been having Physical Therapy 3 times a week, she has been interactive with it. Per pt's daughter, pt's activity was normal before hospitalization where she could walk with assistance.The Pt was more active prior to hospitalizations and her head was not tilted as it is now where it is tilted to the right side. Pt's daughter notes that the patient still has strength, however she is different than prior to her ED visit, her ability to walk has decreased, she is less talkative, and has been sleeping more. Pt's daughter notes that the pt never had a concern of her blood sugar.   Neurology did not suggest a follow up for the pt after the hospital visit.   Pt's daughter is concerned that the pt might have been given some medication that decreased her activity and kept her "knocked out" at the facility.     Review of Systems  Constitutional: Positive for activity change.  Neurological: Positive for speech difficulty and weakness.      Objective:   Physical Exam  Constitutional: She appears well-developed and well-nourished.  In contrast with her last visit with me in February 2016 her appearance is changed markedly. She makes no eye contact. She keeps her eyes closed throughout the visit. She responds to no questioning without shaking her first to gain her  attention. Her answers are generally yes or no and do not seem to be appropriate. She also has a posture with her neck tilted toward the right and a stiffness.  HENT:  Head: Normocephalic and atraumatic.  Eyes: Conjunctivae and EOM are normal. Pupils are equal, round, and reactive to light.  Neck: No thyromegaly present.   neck stiffness resisting movement  Cardiovascular: Normal rate.   Pulmonary/Chest: Effort normal.  Neurological: No cranial nerve deficit. She exhibits abnormal muscle tone.  She is not alert any sense of the word. She is not cognizant of what goes on around her. She knows person but not place or time. Her deep tendon reflexes are symmetrical and not overly depressed but she has overall rigidity in every muscle group.  Skin: Skin is warm and dry.  Nursing note and vitals reviewed.  BP 118/80 mmHg  Pulse 75  Temp(Src) 98.2 F (36.8 C) (Oral)  Resp 16  Ht 5' (1.524 m)  SpO2 98%      Assessment & Plan:  I have completed the patient encounter in its entirety as documented by the scribe, with editing by me where necessary. Semisi Biela P. Merla Riches, M.D.   Rigidity  Hypersomnolence disorder, persistent  Abnormality of gait  Altered sensorium  Discontinue sinemet Wean Celexa over 2 weeks at 10 mg every other day then discontinue She needs a semiurgent neurology evaluation to consider this abrupt change in her normal behavior Obviously no acute stroke  but? Medication side effects , A new neurological process or worsening of her Alzheimer's I will arrange reeval w/ Dr Marjory Lies

## 2014-09-25 NOTE — Telephone Encounter (Signed)
Spoke with Rosanne Ashing and gave verbal orders. He will fax paperwork over.

## 2014-10-07 ENCOUNTER — Telehealth: Payer: Self-pay

## 2014-10-07 NOTE — Telephone Encounter (Signed)
Mallory from [facility] would like to have a verbal order for Occupational Therapy , she have had it before and was doing great improvement Please call [phone]

## 2014-10-07 NOTE — Telephone Encounter (Signed)
Okay for verbal order

## 2014-10-08 NOTE — Telephone Encounter (Signed)
Ok to verbal

## 2014-10-08 NOTE — Telephone Encounter (Signed)
Gave verbal order

## 2014-10-22 NOTE — Telephone Encounter (Signed)
The written orders for this OT was faxed and I put it in Dr Doolittle's box for signature.

## 2014-11-03 ENCOUNTER — Encounter: Payer: Self-pay | Admitting: Diagnostic Neuroimaging

## 2014-11-03 ENCOUNTER — Ambulatory Visit (INDEPENDENT_AMBULATORY_CARE_PROVIDER_SITE_OTHER): Payer: Medicare Other | Admitting: Diagnostic Neuroimaging

## 2014-11-03 VITALS — Ht 60.0 in | Wt 101.0 lb

## 2014-11-03 DIAGNOSIS — F0390 Unspecified dementia without behavioral disturbance: Secondary | ICD-10-CM

## 2014-11-03 DIAGNOSIS — F03C0 Unspecified dementia, severe, without behavioral disturbance, psychotic disturbance, mood disturbance, and anxiety: Secondary | ICD-10-CM

## 2014-11-03 NOTE — Patient Instructions (Signed)
i will setup up home palliative care consult.

## 2014-11-03 NOTE — Progress Notes (Signed)
[facility]  PATIENT: Marcia Campbell DOB: [birth year]  REFERRING CLINICIAN:  HISTORY FROM: patient and daughter REASON FOR VISIT: follow up   HISTORICAL  CHIEF COMPLAINT:  Chief Complaint  Patient presents with  . Dementia    rm 6, dgtr -[name],  MMSE 3  . Follow-up    last seen 03/2014    HISTORY OF PRESENT ILLNESS:   UPDATE 11/03/14: Since last visit, had decline and confusion, went to hospital for weakness in June 2016. Then was dc'd to [facility] rehab x 20 days. Now on donepezil and insulin alone, and doing much better. Never had palliative care consult.   UPDATE 04/01/14: Since last visit, continued decline in memory and ADLs. Daughter helping with daily routine. Patient goes to adult daycare M-F 8-4.   UPDATE 12/12/12: Since last visit, memory continues to decline. Also poor appetite and having gradual weight loss. Tries to drink ensure. Gait is declining. C/o right knee pain and stiffness. Having very short steps. Mood and paranoia are better since starting citalopram and risperdal.  PRIOR HPI (03/29/12): 81 year old right-handed female with diabetes, anxiety, here for valuation for possible dementia. Previous is a patient in 2011 for abnormal involuntary movements, which were felt to be related to underlying anxiety. Now patient is referred for dementia evaluation. Patient is having progressive short-term memory problems, increasing anxiety and increasing paranoid thoughts over the past 2 years. The daughter spoke to me separately and also reported lifelong history of depression and paranoid thoughts. These are getting worse recently. For example patient thinks that other people have taken things from her, moved her objects in her home, even put leaves on their driveway in the fall time. She does not have any threatening hallucinations or paranoid thoughts. Patient has some insight when we explained that these events have not recurred.   REVIEW OF  SYSTEMS: Full 14 system review of systems performed and notable only for weight loss.  ALLERGIES: Allergies  Allergen Reactions  . Penicillins Itching    HOME MEDICATIONS: Outpatient Prescriptions Prior to Visit  Medication Sig Dispense Refill  . acetaminophen (TYLENOL) 325 MG tablet Take 2 tablets (650 mg total) by mouth every 6 (six) hours as needed for mild pain (temperature >/= 99.5 F).    Marland Kitchen aspirin 81 MG chewable tablet Chew 1 tablet (81 mg total) by mouth daily.    Marland Kitchen donepezil (ARICEPT) 10 MG tablet TAKE 1 TABLET BY MOUTH EVERY NIGHT AT BEDTIME 90 tablet 2  . Insulin Glargine (LANTUS SOLOSTAR) 100 UNIT/ML Solostar Pen Inject 12 units into the skin at bedtime. 15 mL 10  . lactose free nutrition (BOOST PLUS) LIQD Take 237 mLs by mouth daily.  0  . loperamide (IMODIUM) 2 MG capsule Take 1 capsule (2 mg total) by mouth as needed for diarrhea or loose stools. Not to exceed 8 mg/day 30 capsule 0  . traMADol (ULTRAM) 50 MG tablet Take 1 tablet (50 mg total) by mouth every 6 (six) hours as needed. 30 tablet 0  . carbidopa-levodopa (SINEMET IR) 25-100 MG per tablet Take 0.5 tablets by mouth 2 (two) times daily. 30 tablet 0  . citalopram (CELEXA) 20 MG tablet Take 1 tablet (20 mg total) by mouth daily. Take 1/2 tablet daily. (Patient taking differently: Take 10 mg by mouth daily. Take 1/2 tablet daily.) 90 tablet 3   No facility-administered medications prior to visit.    PAST MEDICAL HISTORY: Past Medical History  Diagnosis Date  . Dementia   .  Diabetes mellitus   . Hypercholesteremia   . Anemia, unspecified 05/28/2013    PAST SURGICAL HISTORY: Past Surgical History  Procedure Laterality Date  . Bowel resection    . Abdominal hysterectomy    . Breast reduction surgery      FAMILY HISTORY: Family History  Problem Relation Age of Onset  . Diabetes Sister   . Cancer Daughter     SOCIAL HISTORY:  Social History   Social History  . Marital Status: Widowed    Spouse Name:  N/A  . Number of Children: 1  . Years of Education: MA   Occupational History  . retired    Social History Main Topics  . Smoking status: Never Smoker   . Smokeless tobacco: Never Used  . Alcohol Use: No  . Drug Use: No  . Sexual Activity: Not on file   Other Topics Concern  . Not on file   Social History Narrative   Patient lives at home with daughter.   Caffeine Use: Occasionally tea     PHYSICAL EXAM  Filed Vitals:   11/03/14 1112  Height: 5' (1.524 m)  Weight: 101 lb (45.813 kg)    Not recorded     Body mass index is 19.73 kg/(m^2).  MMSE - Mini Mental State Exam 11/03/2014 04/01/2014  Orientation to time 0 0  Orientation to Place 0 0  Registration 2 2  Attention/ Calculation 0 0  Recall 0 0  Language- name 2 objects 1 2  Language- repeat 0 1  Language- follow 3 step command 0 1  Language- read & follow direction 0 0  Write a sentence 0 0  Copy design 0 0  Total score 3 6     GENERAL EXAM: General: Patient is awake, alert and in no acute distress.  Well developed and groomed. CONTINUOUS MOUTH MOVEMENTS --> TARDIVE DYSKINESIA.  Cardiovascular:  Heart is regular rate and rhythm with no murmurs.  Neurologic Exam  Mental Status: Awake, alert. DECR FLUENCY. DECR COMPREHENSION. MMSE 3/30. POSITIVE GRASP, SNOUT, ROOTING, MYERSONS. Cranial Nerves: Pupils are equal and reactive to light.  Visual fields are full to confrontation.  Conjugate eye movements are full and symmetric.  Facial sensation and strength are symmetric.  Hearing is intact.  Palate elevated symmetrically and uvula is midline.  Shoulder shrug is symmetric.  Tongue is midline. Motor: Normal bulk and tone.  Full strength in the upper and lower extremities.  No pronator drift. Sensory: Intact and symmetric to light touch. Coordination: No ataxia or dysmetria on finger-nose or rapid alternating movement testing. Gait and Station: Narrow based gait, UNSTEADY GAIT. SHORT, SHUFFLING STEPS. SIGNIFICANT  GAIT APRAXIA. EN BLOC TURNING.    DIAGNOSTIC DATA (LABS, IMAGING, TESTING) - I reviewed patient records, labs, notes, testing and imaging myself where available.  Lab Results  Component Value Date   WBC 5.4 08/27/2014   HGB 11.2* 08/27/2014   HCT 33.0* 08/27/2014   MCV 93.7 08/27/2014   PLT 206 08/27/2014      Component Value Date/Time   NA 141 08/27/2014 1454   K 4.0 08/27/2014 1454   CL 107 08/27/2014 1454   CO2 26 08/27/2014 1450   GLUCOSE 92 08/27/2014 1454   BUN 18 08/27/2014 1454   CREATININE 1.00 08/27/2014 1454   CREATININE 0.95 12/25/2013 1400   CALCIUM 9.3 08/27/2014 1450   PROT 6.6 08/27/2014 1450   ALBUMIN 3.4* 08/27/2014 1450   AST 29 08/27/2014 1450   ALT 23 08/27/2014 1450   ALKPHOS  52 08/27/2014 1450   BILITOT 0.9 08/27/2014 1450   GFRNONAA 46* 08/27/2014 1450   GFRAA 54* 08/27/2014 1450   Lab Results  Component Value Date   CHOL 204* 12/18/2012   HDL 76 12/18/2012   LDLCALC 119* 12/18/2012   TRIG 46 12/18/2012   CHOLHDL 2.7 12/18/2012   Lab Results  Component Value Date   HGBA1C 6.8* 08/28/2014   Lab Results  Component Value Date   VITAMINB12 783 05/16/2013   Lab Results  Component Value Date   TSH 4.187 05/16/2013    04/10/12 CT head 1. Moderate perisylivan and temporal atrophy.  2. Mild periventricular and subcortical chronic small vessel ischemic disease.   ASSESSMENT AND PLAN  81 y.o. female with progressive short term memory problems, anxiety and paranoia.  MMSE 3/30. Positive frontal release signs. CT head shows significant atrophy. Advanced age and severe dementia.   Dx: severe dementia (alzheimer's vs dementia with lewy bodies)  PLAN: 1. Continue comfort measures and palliative care approach; will refer to home palliative care consult to define goals of care and help transition focus towards away from unnecessary/futile medical testing and treatments. 2. Follow up as needed.  Return if symptoms worsen or fail to improve,  for return to PCP.    Suanne Marker, MD 11/03/2014, 11:57 AM Certified in Neurology, Neurophysiology and Neuroimaging  [facility] [address] [place], [place] [address] [phone]

## 2014-11-05 ENCOUNTER — Telehealth: Payer: Self-pay

## 2014-11-05 NOTE — Telephone Encounter (Signed)
Natalia Leatherwood from [facility] is calling to get a verbal order for 1 day 1 week of occupational therapy. Please call! [phone]

## 2014-11-06 NOTE — Telephone Encounter (Signed)
Ok to call her and set up

## 2014-11-06 NOTE — Telephone Encounter (Signed)
Left detailed voice message with verbal orders.

## 2014-11-26 ENCOUNTER — Telehealth: Payer: Self-pay

## 2014-11-26 NOTE — Telephone Encounter (Signed)
Requesting FL-2 and xray for last chest x-ray.    Fraser Din,  [phone]

## 2014-11-27 NOTE — Telephone Encounter (Signed)
Marcia Campbell can you look out for this?

## 2014-12-01 NOTE — Telephone Encounter (Signed)
[facility] called again and would like to know the status. States they need asap. Please call [phone] or [phone]

## 2014-12-02 NOTE — Telephone Encounter (Signed)
[facility] is going to fax [place] a blank FL 2 so we know what is looks like

## 2014-12-02 NOTE — Telephone Encounter (Signed)
Fl-2???? Send the result of xray--the reading

## 2014-12-02 NOTE — Telephone Encounter (Signed)
Marcia Campbell spoke with them yesterday. We do not have any xrays to send them. They were done in the hospital. We cannot release these records. Dr Merla Riches have you seen the FL-2?

## 2014-12-02 NOTE — Telephone Encounter (Signed)
[facility] called back-she said the x-rays were a non-issue. She really needs the FL-2 papers filled out. Her mom is going into [facility] tomorrow 9/29. Please advise at [phone]

## 2014-12-04 NOTE — Telephone Encounter (Signed)
Dr. Merla Riches, do you have this paperwork?

## 2014-12-05 NOTE — Telephone Encounter (Signed)
No--haven't seen it/them

## 2014-12-21 ENCOUNTER — Ambulatory Visit (INDEPENDENT_AMBULATORY_CARE_PROVIDER_SITE_OTHER): Payer: Medicare Other | Admitting: Internal Medicine

## 2014-12-21 ENCOUNTER — Encounter: Payer: Self-pay | Admitting: Internal Medicine

## 2014-12-21 VITALS — BP 118/62 | HR 100 | Temp 98.0°F | Resp 16 | Ht 60.0 in | Wt 95.2 lb

## 2014-12-21 DIAGNOSIS — N3090 Cystitis, unspecified without hematuria: Secondary | ICD-10-CM

## 2014-12-21 MED ORDER — CIPROFLOXACIN HCL 250 MG PO TABS: 250.0000 mg | ORAL_TABLET | Freq: Two times a day (BID) | ORAL | Status: DC

## 2014-12-21 NOTE — Progress Notes (Signed)
Patient ID: Marcia Campbell, female   DOB: 08-17-1921, 81 y.o.   MRN: [id number]   12/21/2014 at 1:21 PM  Marcia Campbell / DOB: 08-17-1921 / MRN: [id number]  Problem list reviewed and updated by me where necessary.   SUBJECTIVE  Marcia Campbell is a 81 y.o. well appearing female presenting for the chief complaint of foul smelling urine. She has no pain and is unable to give hx..     She  has a past medical history of Dementia; Diabetes mellitus; Hypercholesteremia; and Anemia, unspecified (05/28/2013).    Medications reviewed and updated by myself where necessary, and exist elsewhere in the encounter.   Ms. Marcia Campbell is allergic to penicillins. She  reports that she has never smoked. She has never used smokeless tobacco. She reports that she does not drink alcohol or use illicit drugs. She  has no sexual activity history on file. The patient  has past surgical history that includes Bowel resection; Abdominal hysterectomy; and Breast reduction surgery.  Her family history includes Cancer in her daughter; Diabetes in her sister.  Review of Systems  Constitutional: Negative for fever.  Respiratory: Negative for shortness of breath.   Cardiovascular: Negative for chest pain.  Gastrointestinal: Negative for nausea.  Skin: Negative for rash.  Neurological: Negative for dizziness and headaches.    OBJECTIVE  Her  height is 5' (1.524 m) and weight is 95 lb 3.2 oz (43.182 kg). Her oral temperature is 98 F (36.7 C). Her blood pressure is 118/62 and her pulse is 100. Her respiration is 16 and oxygen saturation is 97%.  The patient's body mass index is 18.59 kg/(m^2).  Physical Exam  Constitutional: She is oriented to person, place, and time. She appears well-developed and well-nourished. No distress.  HENT:  Head: Normocephalic.  Eyes: Conjunctivae are normal. No scleral icterus.  Neck: Normal range of motion.  Cardiovascular: Normal rate.   Respiratory: Effort normal.  GI: She  exhibits no distension. There is no tenderness.  Musculoskeletal: Normal range of motion.  Neurological: She is alert and oriented to person, place, and time. Coordination normal.  Skin: Skin is warm and dry.  Psychiatric: Her affect is inappropriate. Her speech is delayed. Cognition and memory are impaired. She is inattentive.    No results found for this or any previous visit (from the past 24 hour(s)).  ASSESSMENT & PLAN  Marcia Campbell was seen today for dysuria.  Diagnoses and all orders for this visit:  Cystitis -     POCT urinalysis dipstick -     POCT Microscopic Urinalysis ([facility]) -     ciprofloxacin (CIPRO) 250 MG tablet; Take 1 tablet (250 mg total) by mouth 2 (two) times daily.

## 2014-12-21 NOTE — Patient Instructions (Signed)

## 2015-01-01 ENCOUNTER — Telehealth: Payer: Self-pay

## 2015-01-01 NOTE — Telephone Encounter (Signed)
Waiting on payment of is $52.75 for 94 pages. From [employer] [place] [facility] [place].

## 2015-01-06 NOTE — Telephone Encounter (Signed)
Payment received and records were sent on 01/06/15 °

## 2015-01-07 DIAGNOSIS — Z0271 Encounter for disability determination: Secondary | ICD-10-CM

## 2015-02-01 ENCOUNTER — Encounter: Payer: Self-pay | Admitting: Internal Medicine

## 2015-03-25 ENCOUNTER — Ambulatory Visit (INDEPENDENT_AMBULATORY_CARE_PROVIDER_SITE_OTHER): Payer: Medicare Other | Admitting: Emergency Medicine

## 2015-03-25 VITALS — BP 104/68 | HR 61 | Temp 97.8°F | Resp 17 | Ht 60.0 in | Wt 94.0 lb

## 2015-03-25 DIAGNOSIS — F03B18 Unspecified dementia, moderate, with other behavioral disturbance: Secondary | ICD-10-CM

## 2015-03-25 DIAGNOSIS — F0391 Unspecified dementia with behavioral disturbance: Secondary | ICD-10-CM | POA: Diagnosis not present

## 2015-03-25 NOTE — Progress Notes (Signed)
Subjective:  Patient ID: Marcia Campbell, female    DOB: 1921/08/23  Age: 82 y.o. MRN: [id number]  CC: has fl2 that needs to be filled out   HPI [name] presents   Patient is a diabetic with the moderately severe dementia. Her daughter is brought around for completion of form so she did have a short term admission to an extended care facility. The patient is going to be placed to give the daughter rest from caring for her.  History Marcia Campbell has a past medical history of Dementia; Diabetes mellitus; Hypercholesteremia; and Anemia, unspecified (05/28/2013).   She has past surgical history that includes Bowel resection; Abdominal hysterectomy; and Breast reduction surgery.   Her  family history includes Cancer in her daughter; Diabetes in her sister.  She   reports that she has never smoked. She has never used smokeless tobacco. She reports that she does not drink alcohol or use illicit drugs.  Outpatient Prescriptions Prior to Visit  Medication Sig Dispense Refill  . acetaminophen (TYLENOL) 325 MG tablet Take 2 tablets (650 mg total) by mouth every 6 (six) hours as needed for mild pain (temperature >/= 99.5 F).    Marland Kitchen aspirin 81 MG chewable tablet Chew 1 tablet (81 mg total) by mouth daily.    Marland Kitchen donepezil (ARICEPT) 10 MG tablet TAKE 1 TABLET BY MOUTH EVERY NIGHT AT BEDTIME 90 tablet 2  . Insulin Glargine (LANTUS SOLOSTAR) 100 UNIT/ML Solostar Pen Inject 12 units into the skin at bedtime. 15 mL 10  . lactose free nutrition (BOOST PLUS) LIQD Take 237 mLs by mouth daily.  0  . loperamide (IMODIUM) 2 MG capsule Take 1 capsule (2 mg total) by mouth as needed for diarrhea or loose stools. Not to exceed 8 mg/day 30 capsule 0  . traMADol (ULTRAM) 50 MG tablet Take 1 tablet (50 mg total) by mouth every 6 (six) hours as needed. 30 tablet 0  . ciprofloxacin (CIPRO) 250 MG tablet Take 1 tablet (250 mg total) by mouth 2 (two) times daily. (Patient not taking: Reported on 03/25/2015) 10 tablet 0    No facility-administered medications prior to visit.    Social History   Social History  . Marital Status: Widowed    Spouse Name: N/A  . Number of Children: 1  . Years of Education: MA   Occupational History  . retired    Social History Main Topics  . Smoking status: Never Smoker   . Smokeless tobacco: Never Used  . Alcohol Use: No  . Drug Use: No  . Sexual Activity: Not Asked   Other Topics Concern  . None   Social History Narrative   Patient lives at home with daughter.   Caffeine Use: Occasionally tea     Review of Systems  Constitutional: Negative for fever, chills and appetite change.  HENT: Negative for congestion, ear pain, postnasal drip, sinus pressure and sore throat.   Eyes: Negative for pain and redness.  Respiratory: Negative for cough, shortness of breath and wheezing.   Cardiovascular: Negative for leg swelling.  Gastrointestinal: Negative for nausea, vomiting, abdominal pain, diarrhea, constipation and blood in stool.  Endocrine: Negative for polyuria.  Genitourinary: Negative for dysuria, urgency, frequency and flank pain.  Musculoskeletal: Negative for gait problem.  Skin: Negative for rash.  Neurological: Negative for weakness and headaches.  Psychiatric/Behavioral: Positive for behavioral problems. Negative for confusion and decreased concentration. The patient is not nervous/anxious.     Objective:  BP 104/68 mmHg  Pulse 61  Temp(Src) 97.8 F (36.6 C) (Oral)  Resp 17  Ht 5' (1.524 m)  Wt 94 lb (42.638 kg)  BMI 18.36 kg/m2  SpO2 97%  Physical Exam  Constitutional: She appears well-developed and well-nourished.  HENT:  Head: Normocephalic and atraumatic.  Eyes: Conjunctivae are normal. Pupils are equal, round, and reactive to light.  Pulmonary/Chest: Effort normal.  Musculoskeletal: She exhibits no edema.  Neurological: She is alert. Gait abnormal.  Skin: Skin is dry.  Psychiatric: She has a normal mood and affect. Cognition and  memory are impaired. She expresses impulsivity. She is inattentive.      Assessment & Plan:   [name] was seen today for has fl2 that needs to be filled out.  Diagnoses and all orders for this visit:  Moderate dementia with behavioral disturbance   I am having [name] maintain her Insulin Glargine, loperamide, donepezil, acetaminophen, aspirin, lactose free nutrition, traMADol, and ciprofloxacin.  No orders of the defined types were placed in this encounter.    Appropriate red flag conditions were discussed with the patient as well as actions that should be taken.  Patient expressed his understanding.  Follow-up: Return if symptoms worsen or fail to improve.  [name], MD

## 2015-04-12 ENCOUNTER — Telehealth: Payer: Self-pay

## 2015-04-12 NOTE — Telephone Encounter (Signed)
Daughter needs to talk with someone about completion of a form for her to return to adult day care she needs this done by Friday or she cant go back   Best number [phone]

## 2015-04-13 ENCOUNTER — Encounter: Payer: Self-pay | Admitting: Internal Medicine

## 2015-04-13 NOTE — Telephone Encounter (Signed)
Dr. Merla Riches  Please see previous message, are you familiar with this patient??

## 2015-05-26 ENCOUNTER — Other Ambulatory Visit: Payer: Self-pay

## 2015-05-26 MED ORDER — INSULIN GLARGINE 100 UNIT/ML SOLOSTAR PEN: PEN_INJECTOR | SUBCUTANEOUS | Status: DC

## 2015-11-16 ENCOUNTER — Encounter (HOSPITAL_COMMUNITY): Payer: Self-pay

## 2015-11-16 ENCOUNTER — Emergency Department (HOSPITAL_COMMUNITY)
Admission: EM | Admit: 2015-11-16 | Discharge: 2015-11-16 | Disposition: A | Discharge status: Home / Self Care | Payer: Medicare Other | Attending: Emergency Medicine | Admitting: Emergency Medicine

## 2015-11-16 DIAGNOSIS — R55 Syncope and collapse: Secondary | ICD-10-CM | POA: Diagnosis not present

## 2015-11-16 DIAGNOSIS — Z7982 Long term (current) use of aspirin: Secondary | ICD-10-CM | POA: Insufficient documentation

## 2015-11-16 DIAGNOSIS — Z794 Long term (current) use of insulin: Secondary | ICD-10-CM | POA: Diagnosis not present

## 2015-11-16 DIAGNOSIS — E119 Type 2 diabetes mellitus without complications: Secondary | ICD-10-CM | POA: Diagnosis not present

## 2015-11-16 DIAGNOSIS — R404 Transient alteration of awareness: Secondary | ICD-10-CM | POA: Diagnosis not present

## 2015-11-16 LAB — COMPREHENSIVE METABOLIC PANEL
ALBUMIN: 3.8 g/dL (ref 3.5–5.0)
ALT: 13 U/L — AB (ref 14–54)
AST: 21 U/L (ref 15–41)
Alkaline Phosphatase: 84 U/L (ref 38–126)
Anion gap: 15 (ref 5–15)
BUN: 23 mg/dL — AB (ref 6–20)
CO2: 19 mmol/L — AB (ref 22–32)
CREATININE: 1.35 mg/dL — AB (ref 0.44–1.00)
Calcium: 9.9 mg/dL (ref 8.9–10.3)
Chloride: 110 mmol/L (ref 101–111)
GFR calc Af Amer: 39 mL/min — ABNORMAL LOW (ref >60)
GFR calc non Af Amer: 33 mL/min — ABNORMAL LOW (ref >60)
Glucose, Bld: 200 mg/dL — ABNORMAL HIGH (ref 65–99)
Potassium: 4.1 mmol/L (ref 3.5–5.1)
SODIUM: 144 mmol/L (ref 135–145)
Total Bilirubin: 0.8 mg/dL (ref 0.3–1.2)
Total Protein: 7.7 g/dL (ref 6.5–8.1)

## 2015-11-16 LAB — URINALYSIS, ROUTINE W REFLEX MICROSCOPIC
BILIRUBIN URINE: NEGATIVE
Glucose, UA: NEGATIVE mg/dL
HGB URINE DIPSTICK: NEGATIVE
Ketones, ur: 15 mg/dL — AB
Leukocytes, UA: NEGATIVE
Nitrite: NEGATIVE
Protein, ur: NEGATIVE mg/dL
SPECIFIC GRAVITY, URINE: 1.025 (ref 1.005–1.030)
pH: 6 (ref 5.0–8.0)

## 2015-11-16 LAB — CBC WITH DIFFERENTIAL/PLATELET
BASOS ABS: 0 10*3/uL (ref 0.0–0.1)
BASOS PCT: 0 %
EOS ABS: 0 10*3/uL (ref 0.0–0.7)
EOS PCT: 0 %
HCT: 36 % (ref 36.0–46.0)
Hemoglobin: 11.5 g/dL — ABNORMAL LOW (ref 12.0–15.0)
LYMPHS PCT: 12 %
Lymphs Abs: 1.1 10*3/uL (ref 0.7–4.0)
MCH: 30 pg (ref 26.0–34.0)
MCHC: 31.9 g/dL (ref 30.0–36.0)
MCV: 94 fL (ref 78.0–100.0)
MONO ABS: 0.5 10*3/uL (ref 0.1–1.0)
Monocytes Relative: 5 %
Neutro Abs: 7.7 10*3/uL (ref 1.7–7.7)
Neutrophils Relative %: 83 %
Platelets: 273 10*3/uL (ref 150–400)
RBC: 3.83 MIL/uL — AB (ref 3.87–5.11)
RDW: 15.1 % (ref 11.5–15.5)
WBC: 9.4 10*3/uL (ref 4.0–10.5)

## 2015-11-16 LAB — I-STAT TROPONIN, ED
TROPONIN I, POC: 0 ng/mL (ref 0.00–0.08)
Troponin i, poc: 0 ng/mL (ref 0.00–0.08)

## 2015-11-16 LAB — CBG MONITORING, ED: Glucose-Capillary: 162 mg/dL — ABNORMAL HIGH (ref 65–99)

## 2015-11-16 NOTE — ED Notes (Signed)
Papers reviewed with daughter and she denies any farther questions. Pt. Going home with her

## 2015-11-16 NOTE — ED Triage Notes (Signed)
Pt. Has been C/O of constipation and today when she tried to have a BM she was reported to be found on the ground. Hy of dementia, no cardiac, stroke, or otherwise

## 2015-11-16 NOTE — ED Notes (Signed)
Completed the in and out cath

## 2015-11-16 NOTE — Discharge Instructions (Signed)
Return to ER as needed with any recurrence of symptoms.

## 2015-11-16 NOTE — ED Provider Notes (Signed)
[facility]-EMER[facility]ENCY DEPT Provider Note   CSN: [id number] Arrival date & time: 11/16/15  1546     History   Chief Complaint Chief Complaint  Patient presents with  . Near Syncope    HPI Marcia Campbell is a 82 y.o. female.  Significant past history for Alzheimer's, IDDM, hypovolemia, iron deficiency anemia, chemotherapy, weakness.  She presents today after a near-syncopal episode at her day facility.  She lives with her daughter during the day. She attends a daycare facility while her daughter works. This is secondary to her dementia. It was reported to EMS, and to our staff that the patient was on the toilet and was straining to have a bowel movement when she became lightheaded. She did not fall but was laid to the ground. She did not have complete loss of consciousness but complained of weakness and dizziness. In route had a large bowel movement into her diaper.  Daughter states patient has no cardiac history. She has never passed out before. She has not known her to be constipated recently and takes MiraLAX daily.    HPI  Past Medical History:  Diagnosis Date  . Anemia, unspecified 05/28/2013  . Dementia   . Diabetes mellitus   . Hypercholesteremia     Patient Active Problem List   Diagnosis Date Noted  . Dementia   . Leg weakness   . Rigidity   . Weakness 08/27/2014  . Anemia, unspecified 05/28/2013  . Thrombocytopenia, unspecified (HCC) 05/28/2013  . Moderate dementia with behavioral disturbance 12/12/2012  . 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 Glaucoma 07/25/2011  . Positive PPD, treated 07/25/2011  . Diverticulosis 07/25/2011  . Anemia, iron deficiency 07/25/2011  . Small bowel obstruction (HCC) 07/25/2011  . DM (diabetes mellitus) (HCC) 07/24/2011  . Hyperlipemia 07/24/2011  . Dysthymic disorder 07/24/2011    Past Surgical History:  Procedure Laterality Date  . ABDOMINAL HYSTERECTOMY    . BOWEL RESECTION    . BREAST REDUCTION SURGERY      OB History    No  data available       Home Medications    Prior to Admission medications   Medication Sig Start Date End Date Taking? Authorizing Provider  acetaminophen (TYLENOL) 325 MG tablet Take 2 tablets (650 mg total) by mouth every 6 (six) hours as needed for mild pain (temperature >/= 99.5 F). 08/30/14   Smitty Cords, DO  aspirin 81 MG chewable tablet Chew 1 tablet (81 mg total) by mouth daily. 08/30/14   Smitty Cords, DO  ciprofloxacin (CIPRO) 250 MG tablet Take 1 tablet (250 mg total) by mouth 2 (two) times daily. Patient not taking: Reported on 03/25/2015 12/21/14   Ashley Jacobs Guest, MD  donepezil (ARICEPT) 10 MG tablet TAKE 1 TABLET BY MOUTH EVERY NIGHT AT BEDTIME 07/08/14   Suanne Marker, MD  Insulin Glargine (LANTUS SOLOSTAR) 100 UNIT/ML Solostar Pen Inject 12 units into the skin at bedtime. 05/26/15   Ofilia Neas, PA-C  lactose free nutrition (BOOST PLUS) LIQD Take 237 mLs by mouth daily. 08/30/14   Smitty Cords, DO  loperamide (IMODIUM) 2 MG capsule Take 1 capsule (2 mg total) by mouth as needed for diarrhea or loose stools. Not to exceed 8 mg/day 06/29/14   Dorna Leitz, PA-C  traMADol (ULTRAM) 50 MG tablet Take 1 tablet (50 mg total) by mouth every 6 (six) hours as needed. 08/30/14   Smitty Cords, DO    Family History Family History  Problem Relation Age  of Onset  . Diabetes Sister   . Cancer Daughter     Social History Social History  Substance Use Topics  . Smoking status: Never Smoker  . Smokeless tobacco: Never Used  . Alcohol use No     Allergies   Penicillins   Review of Systems Review of Systems  Unable to perform ROS: Dementia   Level 5 caveat for Dementia Physical Exam Updated Vital Signs BP 100/59   Pulse 75   Temp 98.1 F (36.7 C) (Oral)   Resp (!) 27   Ht 5\' 1"  (1.549 m)   SpO2 100%   Physical Exam  Constitutional: No distress.  Awake and alert. Not oriented. Thin and frail-appearing.  HENT:  Head:  Normocephalic.  Eyes: Conjunctivae are normal. Pupils are equal, round, and reactive to light. No scleral icterus.  Conjunctiva not pale. Membranes moist.  Neck: Normal range of motion. Neck supple. No thyromegaly present.  Cardiovascular: Normal rate and regular rhythm.  Exam reveals no gallop and no friction rub.   No murmur heard. Regular. Sinus rhythm on the monitor. No ectopy.  Pulmonary/Chest: Effort normal and breath sounds normal. No respiratory distress. She has no wheezes. She has no rales.  Abdominal: Soft. Bowel sounds are normal. She exhibits no distension. There is no tenderness. There is no rebound.  Abdomen soft benign. Some hard stool and large amount of soft stool in her diaper. No abnormalities including no impaction on rectal exam. Guaiac negative stool.  Musculoskeletal: Normal range of motion.  Neurological: She is alert.  Skin: Skin is warm and dry. No rash noted.  Psychiatric: She has a normal mood and affect. Her behavior is normal.     ED Treatments / Results  Labs (all labs ordered are listed, but only abnormal results are displayed) Labs Reviewed  CBG MONITORING, ED - Abnormal; Notable for the following:       Result Value   Glucose-Capillary 162 (*)    All other components within normal limits  CBC WITH DIFFERENTIAL/PLATELET  COMPREHENSIVE METABOLIC PANEL  URINALYSIS, ROUTINE W REFLEX MICROSCOPIC (NOT AT [facility])  CBG MONITORING, ED  I-STAT TROPOININ, ED    EKG  EKG Interpretation None       Radiology No results found.  Procedures Procedures (including critical care time)  Medications Ordered in ED Medications - No data to display   Initial Impression / Assessment and Plan / ED Course  I have reviewed the triage vital signs and the nursing notes.  Pertinent labs & imaging results that were available during my care of the patient were reviewed by me and considered in my medical decision making (see chart for details).  Clinical Course     Symptoms consistent with probable vagal episode and micturition syncope. Patient remains a symptomatically here. EKG without changes. Normal initial and follow-up troponin. Otherwise labs reassuring and normal urine. Appropriate for discharge home. Return if recurrence.  Final Clinical Impressions(s) / ED Diagnoses   Final diagnoses:  None    New Prescriptions New Prescriptions   No medications on file     Rolland PorterMark Aarsh Fristoe, MD 11/16/15 2023

## 2015-11-18 ENCOUNTER — Ambulatory Visit (INDEPENDENT_AMBULATORY_CARE_PROVIDER_SITE_OTHER): Payer: Medicare Other | Admitting: Family Medicine

## 2015-11-18 ENCOUNTER — Encounter: Payer: Self-pay | Admitting: Family Medicine

## 2015-11-18 VITALS — BP 114/75 | HR 96 | Temp 98.8°F | Ht 61.5 in | Wt 99.8 lb

## 2015-11-18 DIAGNOSIS — F0280 Dementia in other diseases classified elsewhere without behavioral disturbance: Secondary | ICD-10-CM

## 2015-11-18 DIAGNOSIS — IMO0001 Reserved for inherently not codable concepts without codable children: Secondary | ICD-10-CM

## 2015-11-18 DIAGNOSIS — E119 Type 2 diabetes mellitus without complications: Secondary | ICD-10-CM | POA: Diagnosis not present

## 2015-11-18 DIAGNOSIS — N289 Disorder of kidney and ureter, unspecified: Secondary | ICD-10-CM | POA: Diagnosis not present

## 2015-11-18 DIAGNOSIS — Z111 Encounter for screening for respiratory tuberculosis: Secondary | ICD-10-CM

## 2015-11-18 DIAGNOSIS — Z794 Long term (current) use of insulin: Secondary | ICD-10-CM

## 2015-11-18 DIAGNOSIS — Z23 Encounter for immunization: Secondary | ICD-10-CM

## 2015-11-18 NOTE — Progress Notes (Addendum)
[facility] [address], [address] [place], [place] [address] [phone] [phone]  Date:  11/18/2015   Name:  Marcia Campbell   DOB:  09/14/1921   MRN:  [id number]  PCP:  Marcia AmsterdamOPLAND,Marcia Karas, MD    Chief Complaint: Establish Care (Pt here to est care. Former pt of Dr. Merla Richesoolittle. Seen in the ER on 11/16/15)   History of Present Illness:  Marcia Campbell is a 82 y.o. very pleasant female patient who presents with the following:  History of diabetes, dementia.  She was seen in the ER 2 days ago with an episode of pre-syncope related to using the toilet.  She did have a reported large stool and has not seemed constipated generally.  This has not happened again and she seems her usual self.    She has had DM since the 1980s.  She has been stable on her dose of insulin for some time, they have not noted any symptoms of hypoglycemia Her A1c a year ago was normal.   Marcia Campbell lives with her daughter Marcia Campbell who is also my patient. During the day while her daughter is working Marcia Campbell goes to a daycare facility.   Her daughter will be traveling for a little while soon and her mom will stay at an assisted living facility for a week.  she needs a FL-2 form for this visit.  They have done this several times in the past without any trouble.   Her daughter has noted that her mother seems to be feeling pretty well.  She is not napping as much as she has in the past.  They check her home glucose on occasion.  She eats well.     Lab Results  Component Value Date   HGBA1C 6.8 (H) 08/28/2014   She will need a flu shot today She has dementia NOS from her neurobiologist.  [facility]  Last visit about a year ago, her dx is dementia, Alzheimer vs lewy body   She has been exposed to TB and was treated for same in the 1960s- she does not have current TB but cannot have a skin test.  We will do a quantiferon gold for her in anticipation of her upcoming stay  at SNF  Patient Active Problem List   Diagnosis Date Noted  . Dementia   . Leg weakness   . Rigidity   . Weakness 08/27/2014  . Anemia, unspecified 05/28/2013  . Thrombocytopenia, unspecified (HCC) 05/28/2013  . Moderate dementia with behavioral disturbance 12/12/2012  . 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 Glaucoma 07/25/2011  . Positive PPD, treated 07/25/2011  . Diverticulosis 07/25/2011  . Anemia, iron deficiency 07/25/2011  . Small bowel obstruction (HCC) 07/25/2011  . DM (diabetes mellitus) (HCC) 07/24/2011  . Hyperlipemia 07/24/2011  . Dysthymic disorder 07/24/2011    Past Medical History:  Diagnosis Date  . Anemia, unspecified 05/28/2013  . Dementia   . Diabetes mellitus   . Hypercholesteremia     Past Surgical History:  Procedure Laterality Date  . ABDOMINAL HYSTERECTOMY    . BOWEL RESECTION    . BREAST REDUCTION SURGERY      Social History  Substance Use Topics  . Smoking status: Never Smoker  . Smokeless tobacco: Never Used  . Alcohol use No    Family History  Problem Relation Age of Onset  . Diabetes Sister   . Cancer Daughter     Allergies  Allergen  Reactions  . Penicillins Itching    Has patient had a PCN reaction causing immediate rash, facial/tongue/throat swelling, SOB or lightheadedness with hypotension: Yes Has patient had a PCN reaction causing severe rash involving mucus membranes or skin necrosis: No Has patient had a PCN reaction that required hospitalization No Has patient had a PCN reaction occurring within the last 10 years: No If all of the above answers are "NO", then may proceed with Cephalosporin use.     Medication list has been reviewed and updated.  Current Outpatient Prescriptions on File Prior to Visit  Medication Sig Dispense Refill  . acetaminophen (TYLENOL) 325 MG tablet Take 2 tablets (650 mg total) by mouth every 6 (six) hours as needed for mild pain (temperature >/= 99.5 F).    Marland Kitchen aspirin 81 MG chewable tablet Chew 1  tablet (81 mg total) by mouth daily.    . Insulin Glargine (LANTUS SOLOSTAR) 100 UNIT/ML Solostar Pen Inject 12 units into the skin at bedtime. 15 mL 0  . lactose free nutrition (BOOST PLUS) LIQD Take 237 mLs by mouth daily. (Patient taking differently: Take 237 mLs by mouth daily as needed (as a supplement). )  0  . loperamide (IMODIUM) 2 MG capsule Take 1 capsule (2 mg total) by mouth as needed for diarrhea or loose stools. Not to exceed 8 mg/day 30 capsule 0   No current facility-administered medications on file prior to visit.     Review of Systems:  As per HPI- otherwise negative.   Physical Examination: Vitals:   11/18/15 1534  BP: 114/75  Pulse: 96  Temp: 98.8 F (37.1 C)   Vitals:   11/18/15 1534  Weight: 99 lb 12.8 oz (45.3 kg)  Height: 5' 1.5" (1.562 m)   Body mass index is 18.55 kg/m. Ideal Body Weight: Weight in (lb) to have BMI = 25: 134.2  GEN: WDWN, NAD, Non-toxic, Alert, elderly lady who looks well HEENT: Atraumatic, Normocephalic. Neck supple. No masses, No LAD. Ears and Nose: No external deformity. CV: RRR, No M/G/R. No JVD. No thrill. No extra heart sounds. PULM: CTA B, no wheezes, crackles, rhonchi. No retractions. No resp. distress. No accessory muscle use. ABD: S, NT, ND EXTR: No c/c/e NEURO narrow based gait with small steps. She is able to get out of a chair and walk on her own PSYCH: is able to answer somesimple direct questions, calm demeanor.    Assessment and Plan: Dementia associated with other underlying disease without behavioral disturbance  Acute renal insufficiency - Plan: Basic metabolic panel  Screening for tuberculosis - Plan: Quantiferon tb gold assay  Diabetes mellitus, insulin dependent (IDDM), controlled (HCC) - Plan: Hemoglobin A1c  Here today to establish care. She also needs an FL2 form so that she can stay at [facility] assisted living while her daughter is out of town. They have done this several times in the past and  never had any trouble.  Gave flu shot and get quantiferon gold in preparation for this visit Will check her A1c to follow her DM, noted mild elevation of her creat 2 days ago so will follow-up today   Signed Marcia Amsterdam, MD  Received her labs- called her daughter Marcia Campbell and [facility]  Results for orders placed or performed in visit on 11/18/15  Basic metabolic panel  Result Value Ref Range   Sodium 141 135 - 145 mEq/L   Potassium 4.2 3.5 - 5.1 mEq/L   Chloride 107 96 - 112 mEq/L   CO2 24 19 -  32 mEq/L   Glucose, Bld 138 (H) 70 - 99 mg/dL   BUN 21 6 - 23 mg/dL   Creatinine, Ser 1.61 0.40 - 1.20 mg/dL   Calcium 9.4 8.4 - 09.6 mg/dL   GFR 04.54 >09.81 mL/min  Hemoglobin A1c  Result Value Ref Range   Hgb A1c MFr Bld 6.9 (H) 4.6 - 6.5 %  Quantiferon tb gold assay  Result Value Ref Range   Interferon Gamma Release Assay POSITIVE (A) NEGATIVE   Quantiferon Nil Value 0.05 IU/mL   Mitogen-Nil >10.00 IU/mL   Quantiferon Tb Ag Minus Nil Value 2.77 IU/mL   Her A1c is under ok control, renal function is good Called TB staff at the [facility]- the quantiferon may continue to be positive in persons who have had TB in the past.  In her case would screen based on symptoms

## 2015-11-18 NOTE — Patient Instructions (Signed)
Please go to the lab for a blood draw today- you also got your flu shot. I will be in touch with your lab results asap Let me know if any problems with the FL2 form that we completed today

## 2015-11-19 LAB — BASIC METABOLIC PANEL
BUN: 21 mg/dL (ref 6–23)
CO2: 24 meq/L (ref 19–32)
Calcium: 9.4 mg/dL (ref 8.4–10.5)
Chloride: 107 mEq/L (ref 96–112)
Creatinine, Ser: 1.04 mg/dL (ref 0.40–1.20)
GFR: 63.75 mL/min (ref >60.00)
Glucose, Bld: 138 mg/dL — ABNORMAL HIGH (ref 70–99)
Potassium: 4.2 mEq/L (ref 3.5–5.1)
Sodium: 141 mEq/L (ref 135–145)

## 2015-11-19 LAB — HEMOGLOBIN A1C: Hgb A1c MFr Bld: 6.9 % — ABNORMAL HIGH (ref 4.6–6.5)

## 2015-11-20 LAB — QUANTIFERON TB GOLD ASSAY (BLOOD)
Interferon Gamma Release Assay: POSITIVE — AB
Mitogen-Nil: >10 IU/mL
Quantiferon Nil Value: 0.05 IU/mL
Quantiferon Tb Ag Minus Nil Value: 2.77 IU/mL

## 2015-12-06 ENCOUNTER — Encounter: Payer: Self-pay | Admitting: Family Medicine

## 2015-12-06 ENCOUNTER — Other Ambulatory Visit: Payer: Self-pay | Admitting: Physician Assistant

## 2015-12-06 MED ORDER — INSULIN GLARGINE 100 UNIT/ML SOLOSTAR PEN: PEN_INJECTOR | SUBCUTANEOUS | 2 refills | Status: DC

## 2015-12-16 NOTE — Telephone Encounter (Signed)
Refilled 12/06/2015 by copeland

## 2016-03-20 ENCOUNTER — Encounter: Payer: Self-pay | Admitting: Family Medicine

## 2016-03-20 ENCOUNTER — Ambulatory Visit (INDEPENDENT_AMBULATORY_CARE_PROVIDER_SITE_OTHER): Payer: Medicare Other | Admitting: Family Medicine

## 2016-03-20 VITALS — BP 110/70 | HR 66 | Temp 97.6°F | Ht 62.0 in | Wt 101.6 lb

## 2016-03-20 DIAGNOSIS — Z794 Long term (current) use of insulin: Secondary | ICD-10-CM | POA: Diagnosis not present

## 2016-03-20 DIAGNOSIS — E118 Type 2 diabetes mellitus with unspecified complications: Secondary | ICD-10-CM | POA: Diagnosis not present

## 2016-03-20 DIAGNOSIS — F0280 Dementia in other diseases classified elsewhere without behavioral disturbance: Secondary | ICD-10-CM | POA: Diagnosis not present

## 2016-03-20 DIAGNOSIS — Z23 Encounter for immunization: Secondary | ICD-10-CM | POA: Diagnosis not present

## 2016-03-20 LAB — COMPREHENSIVE METABOLIC PANEL
ALBUMIN: 4.1 g/dL (ref 3.5–5.2)
ALT: 12 U/L (ref 0–35)
AST: 16 U/L (ref 0–37)
Alkaline Phosphatase: 77 U/L (ref 39–117)
BILIRUBIN TOTAL: 0.8 mg/dL (ref 0.2–1.2)
BUN: 23 mg/dL (ref 6–23)
CALCIUM: 10.1 mg/dL (ref 8.4–10.5)
CHLORIDE: 108 meq/L (ref 96–112)
CO2: 25 mEq/L (ref 19–32)
CREATININE: 1.08 mg/dL (ref 0.40–1.20)
GFR: 60.99 mL/min (ref >60.00)
Glucose, Bld: 164 mg/dL — ABNORMAL HIGH (ref 70–99)
Potassium: 4 mEq/L (ref 3.5–5.1)
SODIUM: 140 meq/L (ref 135–145)
TOTAL PROTEIN: 7.8 g/dL (ref 6.0–8.3)

## 2016-03-20 LAB — HEMOGLOBIN A1C: Hgb A1c MFr Bld: 7.4 % — ABNORMAL HIGH (ref 4.6–6.5)

## 2016-03-20 NOTE — Progress Notes (Signed)
Pre visit review using our clinic review tool, if applicable. No additional management support is needed unless otherwise documented below in the visit note. 

## 2016-03-20 NOTE — Patient Instructions (Signed)
It was great to see you today- I will be in touch with your labs asap Please bring in a urine sample when you can and we will check for protein and rule out any infection

## 2016-03-20 NOTE — Progress Notes (Signed)
[facility] [address] [place], [place] [address] [phone]  Date:  03/20/2016   Name:  Marcia Campbell   DOB:  [birth year]   MRN:  [id number]  PCP:  Abbe Amsterdam, MD    Chief Complaint: Paperwork (Pt here to have FL2 paperwork filled out. )   History of Present Illness:  Marcia Campbell is a 83 y.o. very pleasant female patient who presents with the following:  History of dementia. Here today with her daughter Marcia Campbell who will be traveling for a little while and needs to place her mom in a SNF for respite care.  Marcia Campbell gives the history today  Partial HPI from our visit in September:  Lawrencia lives with her daughter Marcia Campbell who is also my patient. During the day while her daughter is working [name] goes to a daycare facility.   Her daughter will be traveling for a little while soon and her mom will stay at an assisted living facility for a week.  she needs a FL-2 form for this visit.  They have done this several times in the past without any trouble.   Marcia Campbell has noted that her mom is a bit more agitated and cranky for the last couple of months. She can get cranky at home, but has not gotten into any problems with her behavior at day care  Lab Results  Component Value Date   HGBA1C 6.9 (H) 11/18/2015   She has been exposed to TB in the past (she was a Engineer, civil (consulting)) but Marcia Campbell is not sure if she was ever treated.  Negative CXR in [id number], she has no evidence of active TB She has not had fever, weight loss, night sweats, hemoptysis or cough  She is on 12 u on lantus- this and baby aspirin is really all the medication that she takes   Her daughter is not sure when she last had a pneumonia vaccine- will give her a prevnar today She is generally continent of bladder and bowel.  She does need assistance with dressing and bathing.  She is able to feed herself finger foods She does not wander and is not injurious to herself or  others.  She is not verbally abusive   Patient Active Problem List   Diagnosis Date Noted  . Dementia   . Leg weakness   . Rigidity   . Weakness 08/27/2014  . Anemia, unspecified 05/28/2013  . Thrombocytopenia, unspecified 05/28/2013  . Moderate dementia with behavioral disturbance 12/12/2012  . 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 Glaucoma 07/25/2011  . Positive PPD, treated 07/25/2011  . Diverticulosis 07/25/2011  . Anemia, iron deficiency 07/25/2011  . Small bowel obstruction 07/25/2011  . DM (diabetes mellitus) (HCC) 07/24/2011  . Hyperlipemia 07/24/2011  . Dysthymic disorder 07/24/2011    Past Medical History:  Diagnosis Date  . Anemia, unspecified 05/28/2013  . Dementia   . Diabetes mellitus   . Hypercholesteremia     Past Surgical History:  Procedure Laterality Date  . ABDOMINAL HYSTERECTOMY    . BOWEL RESECTION    . BREAST REDUCTION SURGERY      Social History  Substance Use Topics  . Smoking status: Never Smoker  . Smokeless tobacco: Never Used  . Alcohol use No    Family History  Problem Relation Age of Onset  . Diabetes Sister   . Cancer Daughter     Allergies  Allergen Reactions  . Penicillins Itching  Has patient had a PCN reaction causing immediate rash, facial/tongue/throat swelling, SOB or lightheadedness with hypotension: Yes Has patient had a PCN reaction causing severe rash involving mucus membranes or skin necrosis: No Has patient had a PCN reaction that required hospitalization No Has patient had a PCN reaction occurring within the last 10 years: No If all of the above answers are "NO", then may proceed with Cephalosporin use.     Medication list has been reviewed and updated.  Current Outpatient Prescriptions on File Prior to Visit  Medication Sig Dispense Refill  . acetaminophen (TYLENOL) 325 MG tablet Take 2 tablets (650 mg total) by mouth every 6 (six) hours as needed for mild pain (temperature >/= 99.5 F).    Marland Kitchen aspirin 81 MG chewable  tablet Chew 1 tablet (81 mg total) by mouth daily.    . Insulin Glargine (LANTUS SOLOSTAR) 100 UNIT/ML Solostar Pen Inject 12 units into the skin at bedtime. 15 mL 2  . lactose free nutrition (BOOST PLUS) LIQD Take 237 mLs by mouth daily. (Patient taking differently: Take 237 mLs by mouth daily as needed (as a supplement). )  0  . loperamide (IMODIUM) 2 MG capsule Take 1 capsule (2 mg total) by mouth as needed for diarrhea or loose stools. Not to exceed 8 mg/day 30 capsule 0   No current facility-administered medications on file prior to visit.     Review of Systems:  As per HPI- otherwise negative.  [name] reports that she inspects her mother's skin/ bottom for any breakdown and also checks her feet- no concerns about the state of her skin  Wt Readings from Last 3 Encounters:  03/20/16 101 lb 9.6 oz (46.1 kg)  11/18/15 99 lb 12.8 oz (45.3 kg)  03/25/15 94 lb (42.6 kg)      Physical Examination: Vitals:   03/20/16 1113  BP: 110/70  Pulse: 66  Temp: 97.6 F (36.4 C)   Vitals:   03/20/16 1113  Weight: 101 lb 9.6 oz (46.1 kg)  Height: 5\' 2"  (1.575 m)   Body mass index is 18.58 kg/m. Ideal Body Weight: Weight in (lb) to have BMI = 25: 136.4  GEN: WDWN, NAD, Non-toxic, Alert, looks well, slight build HEENT: Atraumatic, Normocephalic. Neck supple. No masses, No LAD. Ears and Nose: No external deformity. CV: RRR, No M/G/R. No JVD. No thrill. No extra heart sounds. PULM: CTA B, no wheezes, crackles, rhonchi. No retractions. No resp. distress. No accessory muscle use. EXTR: No c/c/e NEURO Normal gait.  PSYCH: quite demented.  Does not speak but will follow some commands.  Slightly agitated when it is time to leave- pressing against me to get out of room   Assessment and Plan: Dementia associated with other underlying disease without behavioral disturbance - Plan: Urine culture  Type 2 diabetes mellitus with complication, with long-term current use of insulin (HCC) - Plan:  Hemoglobin A1c, Comprehensive metabolic panel, Urine Microalbumin w/creat. ratio  Immunization due - Plan: Pneumococcal conjugate vaccine 13-valent IM  Need for vaccination with 13-polyvalent pneumococcal conjugate vaccine - Plan: CANCELED: Pneumococcal conjugate vaccine 13-valent  Here today as her daughter Marcia Campbell needs to place her in SNF for respite care; we have done this in the past I completed FL-2 form and also form for her adult daycare center Suggested a urine culture to make sure no UTI causing increased irritability; they will collect at home and bring back.  Given toilet hat and sterile cup, wipes Check A1c today prevnar given Will plan further  follow- up pending labs.   Signed Abbe AmsterdamJessica Copland, MD

## 2016-08-28 ENCOUNTER — Other Ambulatory Visit: Payer: Self-pay | Admitting: Family Medicine

## 2016-10-05 DIAGNOSIS — H524 Presbyopia: Secondary | ICD-10-CM | POA: Diagnosis not present

## 2016-10-05 DIAGNOSIS — Z961 Presence of intraocular lens: Secondary | ICD-10-CM | POA: Diagnosis not present

## 2016-10-05 DIAGNOSIS — H401132 Primary open-angle glaucoma, bilateral, moderate stage: Secondary | ICD-10-CM | POA: Diagnosis not present

## 2016-10-05 DIAGNOSIS — H43813 Vitreous degeneration, bilateral: Secondary | ICD-10-CM | POA: Diagnosis not present

## 2017-01-12 ENCOUNTER — Other Ambulatory Visit: Payer: Self-pay | Admitting: Family Medicine

## 2017-03-04 NOTE — Progress Notes (Signed)
[facility] [address], [address] [place], [place] [id number] [phone] [phone]  Date:  03/07/2017   Name:  Marcia RoysClara R Campbell   DOB:  10/18/1921   MRN:  [id number]  PCP:  Pearline Cablesopland, Masyn Fullam C, MD    Chief Complaint: Paperwork (Pt needs medical examination form filled out for adult care. ) and Medication Refill (Pt would like refill on Lantus Solostar)   History of Present Illness:  Marcia Campbell is a 83 y.o. very pleasant female patient who presents with the following:  I last saw her about one year ago, as follows:  History of dementia. Here today with her daughter Marcia Campbell who will be traveling for a little while and needs to place her mom in a SNF for respite care.  Marcia Campbell gives the history today  Partial HPI from our visit in September:  [name] lives with her daughter Marcia Campbell who is also my patient. During the day while her daughter is working Cruzita goes to a daycare facility.  Her daughter will be traveling for a little while soon and her mom will stay at an assisted living facility for a week. she needs a FL-2 form for this visit. They have done this several times in the past without any trouble.   Marcia Campbell has noted that her mom is a bit more agitated and cranky for the last couple of months. She can get cranky at home, but has not gotten into any problems with her behavior at day care  RecentLabs       Lab Results  Component Value Date   HGBA1C 6.9 (H) 11/18/2015     She has been exposed to TB in the past (she was a Engineer, civil (consulting)nurse) but Marcia Campbell is not sure if she was ever treated.  Negative CXR in 08/2014, she has no evidence of active TB She has not had fever, weight loss, night sweats, hemoptysis or cough  She is on 12 u on lantus- this and baby aspirin is really all the medication that she takes   Her daughter is not sure when she last had a pneumonia vaccine- will give her a prevnar today She is generally continent of  bladder and bowel.  She does need assistance with dressing and bathing.  She is able to feed herself finger foods She does not wander and is not injurious to herself or others.  She is not verbally abusive  She goes to an adult day center and needs a form completed for them - they need this form periodically but I don't think I have filled this out before She goes there Monday through Friday while her daughter is at work  Here today with her daughter [name] [name] notes that he mom seems to be paying more attention to TV recently and is making mpore comments about the programs she is watching instead of just sleeping  They have had constipation issues, but fig newtons seem to help keep her regular They also use miralax daily   Lab Results  Component Value Date   HGBA1C 7.4 (H) 03/20/2016   They are still doing 12 units of lantus once a day- no evidence of low blood sugar noted  She does do some intermittent rapid breathing behavior - more when she is upset but it can be any time.  She has done this for a couple of years and does not seem to be due to any real distress  Marcia Campbell is not  feeling very cooperative today and we had difficulty giving her flu shot.  Elected to do her other labs another day as I do not want to risk injuring her while attempting to draw blood  Patient Active Problem List   Diagnosis Date Noted  . Dementia   . Leg weakness   . Rigidity   . Weakness 08/27/2014  . Anemia, unspecified 05/28/2013  . Thrombocytopenia, unspecified (HCC) 05/28/2013  . Moderate dementia with behavioral disturbance 12/12/2012  . 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 Glaucoma 07/25/2011  . Positive PPD, treated 07/25/2011  . Diverticulosis 07/25/2011  . Anemia, iron deficiency 07/25/2011  . Small bowel obstruction (HCC) 07/25/2011  . DM (diabetes mellitus) (HCC) 07/24/2011  . Hyperlipemia 07/24/2011  . Dysthymic disorder 07/24/2011    Past Medical History:  Diagnosis Date  . Anemia,  unspecified 05/28/2013  . Dementia   . Diabetes mellitus   . Hypercholesteremia     Past Surgical History:  Procedure Laterality Date  . ABDOMINAL HYSTERECTOMY    . BOWEL RESECTION    . BREAST REDUCTION SURGERY      Social History   Tobacco Use  . Smoking status: Never Smoker  . Smokeless tobacco: Never Used  Substance Use Topics  . Alcohol use: No  . Drug use: No    Family History  Problem Relation Age of Onset  . Diabetes Sister   . Cancer Daughter     Allergies  Allergen Reactions  . Penicillins Itching    Has patient had a PCN reaction causing immediate rash, facial/tongue/throat swelling, SOB or lightheadedness with hypotension: Yes Has patient had a PCN reaction causing severe rash involving mucus membranes or skin necrosis: No Has patient had a PCN reaction that required hospitalization No Has patient had a PCN reaction occurring within the last 10 years: No If all of the above answers are "NO", then may proceed with Cephalosporin use.     Medication list has been reviewed and updated.  Current Outpatient Medications on File Prior to Visit  Medication Sig Dispense Refill  . acetaminophen (TYLENOL) 325 MG tablet Take 2 tablets (650 mg total) by mouth every 6 (six) hours as needed for mild pain (temperature >/= 99.5 F).    Marland Kitchen aspirin 81 MG chewable tablet Chew 1 tablet (81 mg total) by mouth daily.    Marland Kitchen lactose free nutrition (BOOST PLUS) LIQD Take 237 mLs by mouth daily. (Patient taking differently: Take 237 mLs by mouth daily as needed (as a supplement). )  0  . loperamide (IMODIUM) 2 MG capsule Take 1 capsule (2 mg total) by mouth as needed for diarrhea or loose stools. Not to exceed 8 mg/day 30 capsule 0   No current facility-administered medications on file prior to visit.     Review of Systems:  As per HPI- otherwise negative. Marcia Hahn has not noted any evidence of illness or change in her mom's condition Pt is not able to contribute to the history  today   Physical Examination: Vitals:   03/07/17 1112  BP: 110/70  Pulse: 71  SpO2: 100%   Vitals:   03/07/17 1112  Weight: 102 lb (46.3 kg)  Height: 5\' 2"  (1.575 m)   Body mass index is 18.66 kg/m. Ideal Body Weight: Weight in (lb) to have BMI = 25: 136.4  GEN: WDWN, NAD, Non-toxic, A & O x 3, thin build Elderly lady who is well dressed and very clean, well cared for as usual She is in a [facility] today as this  is easier for her daughter to manage, but she reports that Alaysha is quite capable of walking at a rapid pace HEENT: Atraumatic, Normocephalic. Neck supple. No masses, No LAD. Ears and Nose: No external deformity. CV: RRR, No M/G/R. No JVD. No thrill. No extra heart sounds. PULM: CTA B, no wheezes, crackles, rhonchi. No retractions. No resp. distress. No accessory muscle use. ABD: S, NT, ND EXTR: No c/c/e PSYCH: severe dementia. Able to follow some commands such as to open her mouth.  Difficulty getting through her flu shot today as she would jerk her arm away Checked her feet today- some crossing over of her toes but overall in very good repair.   Marcia Campbell has not noted any sign of skin breakdown   Assessment and Plan: Type 2 diabetes mellitus with complication, with long-term current use of insulin (HCC) - Plan: Insulin Glargine (LANTUS SOLOSTAR) 100 UNIT/ML Solostar Pen, CBC, Hemoglobin A1c, Comprehensive metabolic panel  Immunization due - Plan: Flu vaccine HIGH DOSE PF (Fluzone High dose)  Moderate dementia with behavioral disturbance  Completed form for her adult day center today Flu shot given Plan to do labs another day when she is feeling calmer.  The flu shot upset her some. Labs ordered future.   Refilled her lantus- would like to get her off this medication if we can but await her A1c  Signed Abbe AmsterdamJessica Ashely Joshua, MD

## 2017-03-07 ENCOUNTER — Encounter: Payer: Self-pay | Admitting: Family Medicine

## 2017-03-07 ENCOUNTER — Ambulatory Visit: Payer: Medicare Other | Admitting: Family Medicine

## 2017-03-07 VITALS — BP 110/70 | HR 71 | Ht 62.0 in | Wt 102.0 lb

## 2017-03-07 DIAGNOSIS — F03C0 Unspecified dementia, severe, without behavioral disturbance, psychotic disturbance, mood disturbance, and anxiety: Secondary | ICD-10-CM

## 2017-03-07 DIAGNOSIS — E118 Type 2 diabetes mellitus with unspecified complications: Secondary | ICD-10-CM

## 2017-03-07 DIAGNOSIS — F0390 Unspecified dementia without behavioral disturbance: Secondary | ICD-10-CM

## 2017-03-07 DIAGNOSIS — Z794 Long term (current) use of insulin: Secondary | ICD-10-CM | POA: Diagnosis not present

## 2017-03-07 DIAGNOSIS — Z23 Encounter for immunization: Secondary | ICD-10-CM

## 2017-03-07 MED ORDER — INSULIN GLARGINE 100 UNIT/ML SOLOSTAR PEN: PEN_INJECTOR | SUBCUTANEOUS | 9 refills | Status: DC

## 2017-03-07 NOTE — Patient Instructions (Signed)
Good to see you again today!  Please come in for labs when you are able

## 2018-02-10 NOTE — Progress Notes (Signed)
[facility] at [facility] [address] [place], [place] [address] [phone]  Date:  02/11/2018   Name:  Marcia Campbell   DOB:  [birth year]   MRN:  [id number]  PCP:  Darreld Mclean, MD    Chief Complaint: Form filled out (formed filled out for daycare) and Anxiety (anxiety and anger)   History of Present Illness:  Marcia Campbell is a 84 y.o. very pleasant female patient who presents with the following:  Visiting today for a check up and to have a form completed for her adult daycare Last seen by myself this past January.  From previous notes:  History of dementia. Here today with her daughter Marcia Campbell who will be traveling for a little whileand needs to place her mom in a SNF for respite care. Marcia Campbell gives the history today Marcia Campbell lives with her daughter Marcia Campbell who is also my patient. During the day while her daughter is working [place] goes to a daycare facility.  Her daughter will be traveling for a little while soon and her mom will stay at an assisted living facility for a week. she needs a FL-2 form for this visit. They have done this several times in the past without any trouble. Marcia Campbell has noted that her mom is a bit more agitated and cranky for the last couple of months. 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 Negative CXR in 08/2014, she has no evidence of active TB She has not had fever, weight loss, night sweats, hemoptysis or cough She is on 12 u on lantus- this and baby aspirinis really all the medication that she takes  Her daughter is not sure when she last had a pneumonia vaccine- will give her a prevnar today She is generally continent of bladder and bowel. She does need assistance with dressing and bathing. She is able to feed herself finger foods She  does not wander and is not injurious to herself or others. She is not verbally abusive Marcia Campbell is not feeling very cooperative today and we had difficulty giving her flu shot.  Elected to do her other labs another day as I do not want to risk injuring her while attempting to draw blood   We can get labs, flu shot if Keneisha is feeling up to it  [place] reports that her mom is getting to be more agitated She is sleeping well.  Not wandering at night. There have not been any complaints from her care facility. She is eating well  Her daughter is still able to manage her at home, but she is interested in a low-dose of medication to help her mom feel more calm. Her aid comes in on the weekends.    She does have some moments when she has a harder time- getting her dressed and ready to go in the am  They do check her glucose at home sometimes- might run in the upper 80s fasting.   She is also on a baby aspirin  No wandering.  She mostly sits and does not do a lot   Her daughter Marcia Campbell is present today, and gives the history.  She would like for her mom to have a flu shot today. Patient Active Problem List  Diagnosis Date Noted  . Leg weakness   . Rigidity   . Weakness 08/27/2014  . Anemia, unspecified 05/28/2013  . Thrombocytopenia, unspecified ([place]) 05/28/2013  . Moderate dementia with behavioral disturbance ([place]) 12/12/2012  . 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 Glaucoma 07/25/2011  . Positive PPD, treated 07/25/2011  . Diverticulosis 07/25/2011  . Anemia, iron deficiency 07/25/2011  . Small bowel obstruction ([place]) 07/25/2011  . DM (diabetes mellitus) ([place]) 07/24/2011  . Hyperlipemia 07/24/2011    Past Medical History:  Diagnosis Date  . Anemia, unspecified 05/28/2013  . Dementia ([place])   . Diabetes mellitus   . Hypercholesteremia     Past Surgical History:  Procedure Laterality Date  . ABDOMINAL HYSTERECTOMY    . BOWEL RESECTION    . BREAST REDUCTION SURGERY      Social History    Tobacco Use  . Smoking status: Never Smoker  . Smokeless tobacco: Never Used  Substance Use Topics  . Alcohol use: No  . Drug use: No    Family History  Problem Relation Age of Onset  . Diabetes Sister   . Cancer Daughter     Allergies  Allergen Reactions  . Penicillins Itching    Has patient had a PCN reaction causing immediate rash, facial/tongue/throat swelling, SOB or lightheadedness with hypotension: Yes Has patient had a PCN reaction causing severe rash involving mucus membranes or skin necrosis: No Has patient had a PCN reaction that required hospitalization No Has patient had a PCN reaction occurring within the last 10 years: No If all of the above answers are "NO", then may proceed with Cephalosporin use.     Medication list has been reviewed and updated.  Current Outpatient Medications on File Prior to Visit  Medication Sig Dispense Refill  . acetaminophen (TYLENOL) 325 MG tablet Take 2 tablets (650 mg total) by mouth every 6 (six) hours as needed for mild pain (temperature >/= 99.5 F).    Marland Kitchen aspirin 81 MG chewable tablet Chew 1 tablet (81 mg total) by mouth daily.    . Insulin Glargine (LANTUS SOLOSTAR) 100 UNIT/ML Solostar Pen INJECT 12 UNITS UNDER THE SKIN AT BEDTIME 15 mL 9  . lactose free nutrition (BOOST PLUS) LIQD Take 237 mLs by mouth daily. (Patient taking differently: Take 237 mLs by mouth daily as needed (as a supplement). )  0  . loperamide (IMODIUM) 2 MG capsule Take 1 capsule (2 mg total) by mouth as needed for diarrhea or loose stools. Not to exceed 8 mg/day 30 capsule 0   No current facility-administered medications on file prior to visit.     Review of Systems:  As per HPI- otherwise negative. Daughter reports no skin breakdown on her behind.  They keep a close eye on this.   Physical Examination: Vitals:   02/11/18 1407  BP: 118/70  Pulse: 70  Resp: 18  SpO2: 99%   Vitals:   02/11/18 1407  Weight: 97 lb 12.8 oz (44.4 kg)  Height:  '5\' 2"'  (1.575 m)   Body mass index is 17.89 kg/m. Ideal Body Weight: Weight in (lb) to have BMI = 25: 136.4  GEN: WDWN, NAD, Non-toxic, A & O x 3, elderly, thin woman seen in wheelchair.  She is neatly groomed and dressed. HEENT: Atraumatic, Normocephalic. Neck supple. No masses, No LAD. Ears and Nose: No external deformity. CV: RRR, No M/G/R. No JVD. No thrill. No extra heart sounds. PULM: CTA B, no wheezes, crackles, rhonchi. No retractions. No resp. distress. No accessory muscle  use. ABD: S, NT, ND, +BS. No rebound. No HSM. EXTR: No c/c/e PSYCH: Advanced dementia.  Will sometimes respond to a question with a couple of words, but not always. Assessment and Plan: Severe dementia ([place]) - Plan: QUEtiapine (SEROQUEL) 25 MG tablet, CBC, Comp Met (CMET), CANCELED: CBC  Type 2 diabetes mellitus with complication, with long-term current use of insulin ([place]) - Plan: CBC, Hemoglobin A1C, Comp Met (CMET), CANCELED: Basic metabolic panel, CANCELED: Hemoglobin A1c  Medication monitoring encounter - Plan: CANCELED: Comprehensive metabolic panel  Agitation - Plan: QUEtiapine (SEROQUEL) 25 MG tablet  Need for influenza vaccination - Plan: Flu vaccine HIGH DOSE PF (Fluzone High dose)  Following up today for advanced dementia and diabetes.  Flu shot given today. Clear is still being treated with Lantus insulin.  Her daughter has not noticed any low sugar.  We will try to get labs today, but this is not usually possible due to patient resistance.  We do not want to risk injuring her during a blood draw. We will start on a low-dose of Seroquel at bedtime, in hopes of reducing agitation. Daughter will let us know how this works for them. Completed brief form for her adult daycare center.  Signed Lamar Blinks, MD

## 2018-02-11 ENCOUNTER — Ambulatory Visit: Payer: Medicare Other | Admitting: Family Medicine

## 2018-02-11 ENCOUNTER — Encounter: Payer: Self-pay | Admitting: Family Medicine

## 2018-02-11 VITALS — BP 118/70 | HR 70 | Resp 18 | Ht 62.0 in | Wt 97.8 lb

## 2018-02-11 DIAGNOSIS — Z5181 Encounter for therapeutic drug level monitoring: Secondary | ICD-10-CM

## 2018-02-11 DIAGNOSIS — F0390 Unspecified dementia without behavioral disturbance: Secondary | ICD-10-CM | POA: Diagnosis not present

## 2018-02-11 DIAGNOSIS — E118 Type 2 diabetes mellitus with unspecified complications: Secondary | ICD-10-CM | POA: Diagnosis not present

## 2018-02-11 DIAGNOSIS — Z794 Long term (current) use of insulin: Secondary | ICD-10-CM

## 2018-02-11 DIAGNOSIS — R451 Restlessness and agitation: Secondary | ICD-10-CM

## 2018-02-11 DIAGNOSIS — Z23 Encounter for immunization: Secondary | ICD-10-CM

## 2018-02-11 DIAGNOSIS — F03C0 Unspecified dementia, severe, without behavioral disturbance, psychotic disturbance, mood disturbance, and anxiety: Secondary | ICD-10-CM

## 2018-02-11 MED ORDER — QUETIAPINE FUMARATE 25 MG PO TABS: 25.0000 mg | ORAL_TABLET | Freq: Every day | ORAL | 6 refills | Status: DC

## 2018-02-11 NOTE — Patient Instructions (Signed)
Good to see you today!  If possible we will get labs today Let's try seroquel 25 mg at bedtime for agitation.  Let me know how this works for you both

## 2018-03-01 ENCOUNTER — Encounter: Payer: Self-pay | Admitting: Family Medicine

## 2018-03-04 ENCOUNTER — Telehealth: Payer: Self-pay

## 2018-03-04 NOTE — Telephone Encounter (Signed)
PA initiated via Covermymeds; KEY: [id number]. Awaiting determination.

## 2018-03-04 NOTE — Telephone Encounter (Signed)
PA approved. Effective from 03/04/2018 through 03/05/2019.

## 2018-03-12 ENCOUNTER — Encounter: Payer: Self-pay | Admitting: Family Medicine

## 2018-03-13 ENCOUNTER — Encounter: Payer: Self-pay | Admitting: Family Medicine

## 2018-03-26 ENCOUNTER — Encounter: Payer: Self-pay | Admitting: Family Medicine

## 2018-05-21 ENCOUNTER — Other Ambulatory Visit: Payer: Self-pay | Admitting: Family Medicine

## 2018-05-21 DIAGNOSIS — E118 Type 2 diabetes mellitus with unspecified complications: Secondary | ICD-10-CM

## 2018-05-21 DIAGNOSIS — Z794 Long term (current) use of insulin: Principal | ICD-10-CM

## 2018-07-12 ENCOUNTER — Inpatient Hospital Stay (HOSPITAL_COMMUNITY)
Admission: EM | Admit: 2018-07-12 | Discharge: 2018-07-17 | DRG: 070 | Disposition: A | Discharge status: Home / Self Care | Payer: Medicare Other | Attending: Family Medicine | Admitting: Family Medicine

## 2018-07-12 ENCOUNTER — Emergency Department (HOSPITAL_COMMUNITY): Payer: Medicare Other

## 2018-07-12 ENCOUNTER — Encounter (HOSPITAL_COMMUNITY): Payer: Self-pay | Admitting: Emergency Medicine

## 2018-07-12 ENCOUNTER — Observation Stay (HOSPITAL_COMMUNITY): Payer: Medicare Other

## 2018-07-12 ENCOUNTER — Other Ambulatory Visit: Payer: Self-pay

## 2018-07-12 DIAGNOSIS — F0391 Unspecified dementia with behavioral disturbance: Secondary | ICD-10-CM | POA: Diagnosis present

## 2018-07-12 DIAGNOSIS — E035 Myxedema coma: Secondary | ICD-10-CM

## 2018-07-12 DIAGNOSIS — D638 Anemia in other chronic diseases classified elsewhere: Secondary | ICD-10-CM | POA: Diagnosis present

## 2018-07-12 DIAGNOSIS — R414 Neurologic neglect syndrome: Secondary | ICD-10-CM | POA: Diagnosis not present

## 2018-07-12 DIAGNOSIS — Z90710 Acquired absence of both cervix and uterus: Secondary | ICD-10-CM

## 2018-07-12 DIAGNOSIS — M2550 Pain in unspecified joint: Secondary | ICD-10-CM | POA: Diagnosis not present

## 2018-07-12 DIAGNOSIS — E860 Dehydration: Secondary | ICD-10-CM | POA: Diagnosis not present

## 2018-07-12 DIAGNOSIS — Z809 Family history of malignant neoplasm, unspecified: Secondary | ICD-10-CM | POA: Diagnosis not present

## 2018-07-12 DIAGNOSIS — F0281 Dementia in other diseases classified elsewhere with behavioral disturbance: Secondary | ICD-10-CM | POA: Diagnosis not present

## 2018-07-12 DIAGNOSIS — I959 Hypotension, unspecified: Secondary | ICD-10-CM | POA: Diagnosis not present

## 2018-07-12 DIAGNOSIS — I10 Essential (primary) hypertension: Secondary | ICD-10-CM | POA: Diagnosis present

## 2018-07-12 DIAGNOSIS — R451 Restlessness and agitation: Secondary | ICD-10-CM

## 2018-07-12 DIAGNOSIS — B9620 Unspecified Escherichia coli [E. coli] as the cause of diseases classified elsewhere: Secondary | ICD-10-CM | POA: Diagnosis not present

## 2018-07-12 DIAGNOSIS — N390 Urinary tract infection, site not specified: Secondary | ICD-10-CM | POA: Diagnosis not present

## 2018-07-12 DIAGNOSIS — Z7401 Bed confinement status: Secondary | ICD-10-CM | POA: Diagnosis not present

## 2018-07-12 DIAGNOSIS — R4020 Unspecified coma: Secondary | ICD-10-CM | POA: Diagnosis not present

## 2018-07-12 DIAGNOSIS — E119 Type 2 diabetes mellitus without complications: Secondary | ICD-10-CM

## 2018-07-12 DIAGNOSIS — R4182 Altered mental status, unspecified: Secondary | ICD-10-CM | POA: Diagnosis not present

## 2018-07-12 DIAGNOSIS — Z794 Long term (current) use of insulin: Secondary | ICD-10-CM

## 2018-07-12 DIAGNOSIS — G9341 Metabolic encephalopathy: Principal | ICD-10-CM | POA: Diagnosis present

## 2018-07-12 DIAGNOSIS — D509 Iron deficiency anemia, unspecified: Secondary | ICD-10-CM | POA: Diagnosis present

## 2018-07-12 DIAGNOSIS — Z9842 Cataract extraction status, left eye: Secondary | ICD-10-CM | POA: Diagnosis not present

## 2018-07-12 DIAGNOSIS — E785 Hyperlipidemia, unspecified: Secondary | ICD-10-CM | POA: Diagnosis present

## 2018-07-12 DIAGNOSIS — E7800 Pure hypercholesterolemia, unspecified: Secondary | ICD-10-CM | POA: Diagnosis not present

## 2018-07-12 DIAGNOSIS — R410 Disorientation, unspecified: Secondary | ICD-10-CM | POA: Diagnosis not present

## 2018-07-12 DIAGNOSIS — F03C0 Unspecified dementia, severe, without behavioral disturbance, psychotic disturbance, mood disturbance, and anxiety: Secondary | ICD-10-CM

## 2018-07-12 DIAGNOSIS — R1311 Dysphagia, oral phase: Secondary | ICD-10-CM | POA: Diagnosis not present

## 2018-07-12 DIAGNOSIS — R404 Transient alteration of awareness: Secondary | ICD-10-CM | POA: Diagnosis not present

## 2018-07-12 DIAGNOSIS — E039 Hypothyroidism, unspecified: Secondary | ICD-10-CM | POA: Diagnosis present

## 2018-07-12 DIAGNOSIS — F0390 Unspecified dementia without behavioral disturbance: Secondary | ICD-10-CM | POA: Diagnosis not present

## 2018-07-12 DIAGNOSIS — E11649 Type 2 diabetes mellitus with hypoglycemia without coma: Secondary | ICD-10-CM | POA: Diagnosis present

## 2018-07-12 DIAGNOSIS — Z7982 Long term (current) use of aspirin: Secondary | ICD-10-CM | POA: Diagnosis not present

## 2018-07-12 DIAGNOSIS — Z79899 Other long term (current) drug therapy: Secondary | ICD-10-CM | POA: Diagnosis not present

## 2018-07-12 DIAGNOSIS — Z9841 Cataract extraction status, right eye: Secondary | ICD-10-CM

## 2018-07-12 DIAGNOSIS — Z833 Family history of diabetes mellitus: Secondary | ICD-10-CM | POA: Diagnosis not present

## 2018-07-12 DIAGNOSIS — F03B18 Unspecified dementia, moderate, with other behavioral disturbance: Secondary | ICD-10-CM | POA: Diagnosis present

## 2018-07-12 DIAGNOSIS — R531 Weakness: Secondary | ICD-10-CM | POA: Diagnosis not present

## 2018-07-12 LAB — URINALYSIS, ROUTINE W REFLEX MICROSCOPIC
Bacteria, UA: NONE SEEN
Bilirubin Urine: NEGATIVE
Glucose, UA: NEGATIVE mg/dL
Ketones, ur: 5 mg/dL — AB
Nitrite: NEGATIVE
Protein, ur: NEGATIVE mg/dL
RBC / HPF: >50 RBC/hpf — ABNORMAL HIGH (ref 0–5)
Specific Gravity, Urine: 1.016 (ref 1.005–1.030)
WBC, UA: >50 WBC/hpf — ABNORMAL HIGH (ref 0–5)
pH: 5 (ref 5.0–8.0)

## 2018-07-12 LAB — CBC
HCT: 32.9 % — ABNORMAL LOW (ref 36.0–46.0)
Hemoglobin: 10.5 g/dL — ABNORMAL LOW (ref 12.0–15.0)
MCH: 31.1 pg (ref 26.0–34.0)
MCHC: 31.9 g/dL (ref 30.0–36.0)
MCV: 97.3 fL (ref 80.0–100.0)
Platelets: 264 10*3/uL (ref 150–400)
RBC: 3.38 MIL/uL — ABNORMAL LOW (ref 3.87–5.11)
RDW: 13.2 % (ref 11.5–15.5)
WBC: 5.4 10*3/uL (ref 4.0–10.5)
nRBC: 0 % (ref 0.0–0.2)

## 2018-07-12 LAB — COMPREHENSIVE METABOLIC PANEL
ALT: 10 U/L (ref 0–44)
AST: 16 U/L (ref 15–41)
Albumin: 3.3 g/dL — ABNORMAL LOW (ref 3.5–5.0)
Alkaline Phosphatase: 48 U/L (ref 38–126)
Anion gap: 14 (ref 5–15)
BUN: 18 mg/dL (ref 8–23)
CO2: 18 mmol/L — ABNORMAL LOW (ref 22–32)
Calcium: 9.1 mg/dL (ref 8.9–10.3)
Chloride: 111 mmol/L (ref 98–111)
Creatinine, Ser: 1.14 mg/dL — ABNORMAL HIGH (ref 0.44–1.00)
GFR calc Af Amer: 48 mL/min — ABNORMAL LOW (ref >60)
GFR calc non Af Amer: 41 mL/min — ABNORMAL LOW (ref >60)
Glucose, Bld: 148 mg/dL — ABNORMAL HIGH (ref 70–99)
Potassium: 3.8 mmol/L (ref 3.5–5.1)
Sodium: 143 mmol/L (ref 135–145)
Total Bilirubin: 0.6 mg/dL (ref 0.3–1.2)
Total Protein: 6.4 g/dL — ABNORMAL LOW (ref 6.5–8.1)

## 2018-07-12 LAB — SEDIMENTATION RATE: Sed Rate: 19 mm/hr (ref 0–22)

## 2018-07-12 LAB — DIFFERENTIAL
Abs Immature Granulocytes: 0.03 10*3/uL (ref 0.00–0.07)
Basophils Absolute: 0 10*3/uL (ref 0.0–0.1)
Basophils Relative: 0 %
Eosinophils Absolute: 0.1 10*3/uL (ref 0.0–0.5)
Eosinophils Relative: 2 %
Immature Granulocytes: 1 %
Lymphocytes Relative: 40 %
Lymphs Abs: 2.2 10*3/uL (ref 0.7–4.0)
Monocytes Absolute: 0.3 10*3/uL (ref 0.1–1.0)
Monocytes Relative: 6 %
Neutro Abs: 2.8 10*3/uL (ref 1.7–7.7)
Neutrophils Relative %: 51 %

## 2018-07-12 LAB — PROTIME-INR
INR: 1.1 (ref 0.8–1.2)
Prothrombin Time: 14.1 seconds (ref 11.4–15.2)

## 2018-07-12 LAB — RAPID URINE DRUG SCREEN, HOSP PERFORMED
Amphetamines: NOT DETECTED
Barbiturates: NOT DETECTED
Benzodiazepines: NOT DETECTED
Cocaine: NOT DETECTED
Opiates: NOT DETECTED
Tetrahydrocannabinol: NOT DETECTED

## 2018-07-12 LAB — TROPONIN I: Troponin I: <0.03 ng/mL (ref <0.03)

## 2018-07-12 LAB — TSH: TSH: 12.208 u[IU]/mL — ABNORMAL HIGH (ref 0.350–4.500)

## 2018-07-12 LAB — T4, FREE: Free T4: 0.72 ng/dL — ABNORMAL LOW (ref 0.82–1.77)

## 2018-07-12 LAB — I-STAT CREATININE, ED: Creatinine, Ser: 1 mg/dL (ref 0.44–1.00)

## 2018-07-12 LAB — ETHANOL: Alcohol, Ethyl (B): <10 mg/dL (ref <10)

## 2018-07-12 LAB — CBG MONITORING, ED: Glucose-Capillary: 140 mg/dL — ABNORMAL HIGH (ref 70–99)

## 2018-07-12 LAB — APTT: aPTT: 31 seconds (ref 24–36)

## 2018-07-12 LAB — VITAMIN B12: Vitamin B-12: 299 pg/mL (ref 180–914)

## 2018-07-12 LAB — AMMONIA: Ammonia: 16 umol/L (ref 9–35)

## 2018-07-12 LAB — C-REACTIVE PROTEIN: CRP: <0.8 mg/dL (ref <1.0)

## 2018-07-12 MED ORDER — SODIUM CHLORIDE 0.9 % IV BOLUS
1000.0000 mL | Freq: Once | INTRAVENOUS | Status: AC
  Administered 2018-07-12: 1000 mL via INTRAVENOUS

## 2018-07-12 MED ORDER — LEVOTHYROXINE SODIUM 25 MCG PO TABS
25.0000 ug | ORAL_TABLET | Freq: Every day | ORAL | Status: DC
  Administered 2018-07-13 – 2018-07-14 (×2): 25 ug via ORAL
  Filled 2018-07-12 (×2): qty 1

## 2018-07-12 NOTE — H&P (Addendum)
History and Physical   Marcia Campbell WUJ:[id number] DOB: 1921/03/09 DOA: 07/12/2018  Referring MD/NP/PA: Dr. Adela Lank  PCP: Patsy Lager Gwenlyn Found, MD   Outpatient Specialists: None  Patient coming from: Home  Chief Complaint: Altered mental status  HPI: Marcia Campbell is a 85 y.o. female with medical history significant of dementia, diabetes, hyperlipidemia, hypertension and anemia of chronic disease who was brought in by family is due to her sudden onset of confusion today.  Patient was at her baseline which is dementia was talking and laughing watching TV.  30 minutes later they found her unresponsive in a chair.  When EMS arrived she was hypotensive.  Good IV fluids.  Mental status improved a little.  Patient was brought to the ER where she was fully evaluated.  A code stroke was initially activated.  Neurology has evaluated patient with no evidence of CVA ongoing.  Work-up still continue with MRI ordered.  Patient however was found to have significant hypothyroidism.  TSH was more than 12 and free T4 is low.  She has been given fluids and initial dose of levothyroxine.  Mental status slightly improved but patient is being admitted for observation and to complete work-up for possible stroke.  Seizure was contemplated but EEG was normal..  ED Course: Her temperature is 95.9 blood pressure 97/58 pulse 70 respiratory 21 oxygen sats 97% on room air.  Urinalysis is negative for acute except for moderate leukocytosis with no bacteria.  Urine drug screen is negative.  TSH 12.208 free T4 0.72.  B12 is normal CRP is negative ammonia is normal.  Chest x-ray showed no active disease.  Chemistry essentially within normal except for glucose 148 CO2 of 18.  Albumin is 3.3.  CBC showed hemoglobin 10.5 otherwise no significant finding.  MRI has been ordered and patient is being admitted for observation  Review of Systems: As per HPI otherwise 10 point review of systems negative.    Past Medical History:   Diagnosis Date  . Anemia, unspecified 05/28/2013  . Dementia (HCC)   . Diabetes mellitus   . Hypercholesteremia     Past Surgical History:  Procedure Laterality Date  . ABDOMINAL HYSTERECTOMY    . BOWEL RESECTION    . BREAST REDUCTION SURGERY       reports that she has never smoked. She has never used smokeless tobacco. She reports that she does not drink alcohol or use drugs.  Allergies  Allergen Reactions  . Penicillins Itching    Has patient had a PCN reaction causing immediate rash, facial/tongue/throat swelling, SOB or lightheadedness with hypotension: Yes Has patient had a PCN reaction causing severe rash involving mucus membranes or skin necrosis: No Has patient had a PCN reaction that required hospitalization No Has patient had a PCN reaction occurring within the last 10 years: No If all of the above answers are "NO", then may proceed with Cephalosporin use.     Family History  Problem Relation Age of Onset  . Diabetes Sister   . Cancer Daughter      Prior to Admission medications   Medication Sig Start Date End Date Taking? Authorizing Provider  acetaminophen (TYLENOL) 325 MG tablet Take 2 tablets (650 mg total) by mouth every 6 (six) hours as needed for mild pain (temperature >/= 99.5 F). 08/30/14   Karamalegos, Netta Neat, DO  aspirin 81 MG chewable tablet Chew 1 tablet (81 mg total) by mouth daily. 08/30/14   Karamalegos, Netta Neat, DO  Insulin Glargine (LANTUS SOLOSTAR)  100 UNIT/ML Solostar Pen INJECT 12 UNITS UNDER THE SKIN EVERY NIGHT AT BEDTIME 05/22/18   Copland, Gwenlyn Found, MD  lactose free nutrition (BOOST PLUS) LIQD Take 237 mLs by mouth daily. Patient taking differently: Take 237 mLs by mouth daily as needed (as a supplement).  08/30/14   Karamalegos, Netta Neat, DO  loperamide (IMODIUM) 2 MG capsule Take 1 capsule (2 mg total) by mouth as needed for diarrhea or loose stools. Not to exceed 8 mg/day 06/29/14   Dorna Leitz, PA-C  QUEtiapine (SEROQUEL) 25  MG tablet Take 1 tablet (25 mg total) by mouth at bedtime. 02/11/18   Pearline Cables, MD    Physical Exam: Vitals:   07/12/18 2037 07/12/18 2145 07/12/18 2200 07/12/18 2215  BP: 130/66 (!) 124/59 (!) 129/54 (!) 134/57  Pulse: 75  78   Resp: 12 15 (!) 21 14  Temp:      TempSrc:      SpO2: 100% 100% 100% 100%      Constitutional: Confused but calm.  Vitals:   07/12/18 2037 07/12/18 2145 07/12/18 2200 07/12/18 2215  BP: 130/66 (!) 124/59 (!) 129/54 (!) 134/57  Pulse: 75  78   Resp: 12 15 (!) 21 14  Temp:      TempSrc:      SpO2: 100% 100% 100% 100%   Eyes: PERRL, lids and conjunctivae normal ENMT: Mucous membranes are moist. Posterior pharynx clear of any exudate or lesions.Normal dentition.  Neck: normal, supple, no masses, no thyromegaly Respiratory: clear to auscultation bilaterally, no wheezing, no crackles. Normal respiratory effort. No accessory muscle use.  Cardiovascular: Bradycardic, no murmurs / rubs / gallops. No extremity edema. 2+ pedal pulses. No carotid bruits.  Abdomen: no tenderness, no masses palpated. No hepatosplenomegaly. Bowel sounds positive.  Musculoskeletal: no clubbing / cyanosis. No joint deformity upper and lower extremities. Good ROM, no contractures. Normal muscle tone.  Skin: no rashes, lesions, ulcers. No induration Neurologic: CN 2-12 grossly intact. Sensation intact, DTR normal. Strength 5/5 in all 4.  Psychiatric: Confused, slightly agitated, normal mood.     Labs on Admission: I have personally reviewed following labs and imaging studies  CBC: Recent Labs  Lab 07/12/18 1711  WBC 5.4  NEUTROABS 2.8  HGB 10.5*  HCT 32.9*  MCV 97.3  PLT 264   Basic Metabolic Panel: Recent Labs  Lab 07/12/18 1711 07/12/18 1718  NA 143  --   K 3.8  --   CL 111  --   CO2 18*  --   GLUCOSE 148*  --   BUN 18  --   CREATININE 1.14* 1.00  CALCIUM 9.1  --    GFR: CrCl cannot be calculated (Unknown ideal weight.). Liver Function Tests:  Recent Labs  Lab 07/12/18 1711  AST 16  ALT 10  ALKPHOS 48  BILITOT 0.6  PROT 6.4*  ALBUMIN 3.3*   No results for input(s): LIPASE, AMYLASE in the last 168 hours. Recent Labs  Lab 07/12/18 2003  AMMONIA 16   Coagulation Profile: Recent Labs  Lab 07/12/18 1711  INR 1.1   Cardiac Enzymes: Recent Labs  Lab 07/12/18 1711  TROPONINI <0.03   BNP (last 3 results) No results for input(s): PROBNP in the last 8760 hours. HbA1C: No results for input(s): HGBA1C in the last 72 hours. CBG: Recent Labs  Lab 07/12/18 1710  GLUCAP 140*   Lipid Profile: No results for input(s): CHOL, HDL, LDLCALC, TRIG, CHOLHDL, LDLDIRECT in the last 72 hours. Thyroid Function  Tests: Recent Labs    07/12/18 2003 07/12/18 2116  TSH 12.208*  --   FREET4  --  0.72*   Anemia Panel: Recent Labs    07/12/18 2003  VITAMINB12 299   Urine analysis:    Component Value Date/Time   COLORURINE YELLOW 07/12/2018 2040   APPEARANCEUR HAZY (A) 07/12/2018 2040   LABSPEC 1.016 07/12/2018 2040   PHURINE 5.0 07/12/2018 2040   GLUCOSEU NEGATIVE 07/12/2018 2040   HGBUR MODERATE (A) 07/12/2018 2040   BILIRUBINUR NEGATIVE 07/12/2018 2040   BILIRUBINUR neg 05/22/2013 1049   KETONESUR 5 (A) 07/12/2018 2040   PROTEINUR NEGATIVE 07/12/2018 2040   UROBILINOGEN 0.2 08/27/2014 1715   NITRITE NEGATIVE 07/12/2018 2040   LEUKOCYTESUR MODERATE (A) 07/12/2018 2040   Sepsis Labs: @LABRCNTIP (procalcitonin:4,lacticidven:4) )No results found for this or any previous visit (from the past 240 hour(s)).   Radiological Exams on Admission: Dg Chest Port 1 View  Result Date: 07/12/2018 CLINICAL DATA:  Weakness and altered mental status EXAM: PORTABLE CHEST 1 VIEW COMPARISON:  08/29/2014 FINDINGS: The patient's chin obscures the right lung apex. Atherosclerotic calcification of the aortic arch. Heart size within normal limits. The lungs appear clear. No blunting of the costophrenic angles. IMPRESSION: 1.  No active  cardiopulmonary disease is radiographically apparent. 2.  Aortic Atherosclerosis (ICD10-I70.0). Electronically Signed   By: Gaylyn RongWalter  Liebkemann M.D.   On: 07/12/2018 18:44   Ct Head Code Stroke Wo Contrast  Result Date: 07/12/2018 CLINICAL DATA:  Code stroke.  Left neglect. EXAM: CT HEAD WITHOUT CONTRAST TECHNIQUE: Contiguous axial images were obtained from the base of the skull through the vertex without intravenous contrast. COMPARISON:  Head CT 08/27/2014 and MRI 08/29/2014 FINDINGS: Brain: There is no evidence of acute infarct, intracranial hemorrhage, mass, midline shift, or extra-axial fluid collection. Cerebral atrophy is again noted including prominent mesial temporal lobe atrophy. Patchy cerebral white matter hypodensities may have mildly progressed and are nonspecific but compatible with mild-to-moderate chronic small vessel ischemic disease. Vascular: Calcified atherosclerosis at the skull base. No hyperdense vessel. Skull: No fracture or focal osseous lesion. Sinuses/Orbits: Minimal mucosal thickening partially visualized in the right maxillary sinus. Clear mastoid air cells. Bilateral cataract extraction. Other: None. ASPECTS [facility]([place]ta Stroke Program Early CT Score) - Ganglionic level infarction (caudate, lentiform nuclei, internal capsule, insula, M1-M3 cortex): 7 - Supraganglionic infarction (M4-M6 cortex): 3 Total score (0-10 with 10 being normal): 10 IMPRESSION: 1. No evidence of acute intracranial abnormality. 2. ASPECTS is 10. 3. Chronic small vessel ischemic disease and cerebral atrophy. These results were communicated to Dr. Otelia LimesLindzen at 5:39 pm on 07/12/2018 by text page via the [facility] messaging system. Electronically Signed   By: Sebastian AcheAllen  Grady M.D.   On: 07/12/2018 17:40    EKG: Independently reviewed.  Sinus rhythm with inferior infarct and a rate of 64.  Assessment/Plan Principal Problem:   AMS (altered mental status) Active Problems:   DM (diabetes mellitus) (HCC)   Hyperlipemia    Anemia, iron deficiency   Moderate dementia with behavioral disturbance (HCC)   Hypothyroidism     #1 altered mental status: Most likely due to hypothyroidism, dehydration.  Improving slowly.  Continue with hydration.  Started levothyroxine and will continue.  #2 diabetes: Sliding scale insulin with home regimen.  #3 hyperlipidemia: Continue statin  #4 hypothyroidism: Continue levothyroxine replacement.  This should continue at home with monitoring by PCP.  #5 iron deficiency anemia: Hemoglobin at baseline.  Continue to monitor.  #6 dementia: No agitation at the moment.  #7 hypothymia:  Most likely related to the thyroid level being low.  Keep an eye closely.  Monitor temperature closely.   DVT prophylaxis: Lovenox Code Status: Full code Family Communication: Daughter over the phone Disposition Plan: To be determined Consults called: Neurology Dr. Otelia Limes Admission status: Observation  Severity of Illness: The appropriate patient status for this patient is OBSERVATION. Observation status is judged to be reasonable and necessary in order to provide the required intensity of service to ensure the patient's safety. The patient's presenting symptoms, physical exam findings, and initial radiographic and laboratory data in the context of their medical condition is felt to place them at decreased risk for further clinical deterioration. Furthermore, it is anticipated that the patient will be medically stable for discharge from the hospital within 2 midnights of admission. The following factors support the patient status of observation.   " The patient's presenting symptoms include confusion. " The physical exam findings include confused. " The initial radiographic and laboratory data are no significant finding on head CT and EEG.     Lonia Blood MD [facility] Pager [phone]  If 7PM-7AM, please contact night-coverage [email or web address] Password [facility]  07/12/2018, 10:37 PM

## 2018-07-12 NOTE — ED Provider Notes (Signed)
[facility] EMERGENCY DEPARTMENT Provider Note   CSN: [id number] Arrival date & time: 07/12/18  1658    History   Chief Complaint No chief complaint on file.   HPI Marcia Campbell is a 85 y.o. female.     85 yo F with a chief complaint of altered mental status.  Per the family the patient was at her baseline of dementia was talking and laughing and watching TV.  About 30 minutes later they found her unresponsive in a chair.  Initially with EMS they found her to be hypotensive.  They feel like her mental status improved with some IV fluids along with her blood pressure.  Patient is nonverbal.  Level 5 caveat.  The history is provided by the patient.  Illness  Severity:  Severe Onset quality:  Sudden Duration:  90 minutes Timing:  Constant Progression:  Partially resolved Chronicity:  New   Past Medical History:  Diagnosis Date  . Anemia, unspecified 05/28/2013  . Dementia (HCC)   . Diabetes mellitus   . Hypercholesteremia     Patient Active Problem List   Diagnosis Date Noted  . AMS (altered mental status) 07/12/2018  . Hypothyroidism 07/12/2018  . Leg weakness   . Rigidity   . Weakness 08/27/2014  . Anemia, unspecified 05/28/2013  . Thrombocytopenia, unspecified (HCC) 05/28/2013  . Moderate dementia with behavioral disturbance (HCC) 12/12/2012  . 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 Glaucoma 07/25/2011  . Positive PPD, treated 07/25/2011  . Diverticulosis 07/25/2011  . Anemia, iron deficiency 07/25/2011  . Small bowel obstruction (HCC) 07/25/2011  . DM (diabetes mellitus) (HCC) 07/24/2011  . Hyperlipemia 07/24/2011    Past Surgical History:  Procedure Laterality Date  . ABDOMINAL HYSTERECTOMY    . BOWEL RESECTION    . BREAST REDUCTION SURGERY       OB History   No obstetric history on file.      Home Medications    Prior to Admission medications   Medication Sig Start Date End Date Taking? Authorizing Provider  acetaminophen (TYLENOL)  325 MG tablet Take 2 tablets (650 mg total) by mouth every 6 (six) hours as needed for mild pain (temperature >/= 99.5 F). 08/30/14   Karamalegos, Netta Neat, DO  aspirin 81 MG chewable tablet Chew 1 tablet (81 mg total) by mouth daily. 08/30/14   Karamalegos, Netta Neat, DO  Insulin Glargine (LANTUS SOLOSTAR) 100 UNIT/ML Solostar Pen INJECT 12 UNITS UNDER THE SKIN EVERY NIGHT AT BEDTIME 05/22/18   Copland, Gwenlyn Found, MD  lactose free nutrition (BOOST PLUS) LIQD Take 237 mLs by mouth daily. Patient taking differently: Take 237 mLs by mouth daily as needed (as a supplement).  08/30/14   Karamalegos, Netta Neat, DO  loperamide (IMODIUM) 2 MG capsule Take 1 capsule (2 mg total) by mouth as needed for diarrhea or loose stools. Not to exceed 8 mg/day 06/29/14   Dorna Leitz, PA-C  QUEtiapine (SEROQUEL) 25 MG tablet Take 1 tablet (25 mg total) by mouth at bedtime. 02/11/18   Copland, Gwenlyn Found, MD    Family History Family History  Problem Relation Age of Onset  . Diabetes Sister   . Cancer Daughter     Social History Social History   Tobacco Use  . Smoking status: Never Smoker  . Smokeless tobacco: Never Used  Substance Use Topics  . Alcohol use: No  . Drug use: No     Allergies   Penicillins   Review of Systems Review of Systems  Unable to perform ROS: Mental status change     Physical Exam Updated Vital Signs BP (!) 134/57   Pulse 78   Temp (!) 95.9 F (35.5 C) (Rectal)   Resp 14   SpO2 100%   Physical Exam Vitals signs and nursing note reviewed.  Constitutional:      General: She is not in acute distress.    Appearance: She is well-developed. She is not diaphoretic.  HENT:     Head: Normocephalic and atraumatic.  Eyes:     Pupils: Pupils are equal, round, and reactive to light.  Neck:     Musculoskeletal: Normal range of motion and neck supple.  Cardiovascular:     Rate and Rhythm: Normal rate and regular rhythm.     Heart sounds: No murmur. No friction rub. No  gallop.   Pulmonary:     Effort: Pulmonary effort is normal.     Breath sounds: No wheezing or rales.  Abdominal:     General: There is no distension.     Palpations: Abdomen is soft.     Tenderness: There is no abdominal tenderness.  Musculoskeletal:        General: No tenderness.  Skin:    General: Skin is warm and dry.  Neurological:     Mental Status: She is alert and oriented to person, place, and time.     Comments: Eyes are deviated to the right.  Patient has diffuse muscle spasticity grimaces to pain with left trap pinch but no withdrawal  Psychiatric:        Behavior: Behavior normal.      ED Treatments / Results  Labs (all labs ordered are listed, but only abnormal results are displayed) Labs Reviewed  CBC - Abnormal; Notable for the following components:      Result Value   RBC 3.38 (*)    Hemoglobin 10.5 (*)    HCT 32.9 (*)    All other components within normal limits  COMPREHENSIVE METABOLIC PANEL - Abnormal; Notable for the following components:   CO2 18 (*)    Glucose, Bld 148 (*)    Creatinine, Ser 1.14 (*)    Total Protein 6.4 (*)    Albumin 3.3 (*)    GFR calc non Af Amer 41 (*)    GFR calc Af Amer 48 (*)    All other components within normal limits  URINALYSIS, ROUTINE W REFLEX MICROSCOPIC - Abnormal; Notable for the following components:   APPearance HAZY (*)    Hgb urine dipstick MODERATE (*)    Ketones, ur 5 (*)    Leukocytes,Ua MODERATE (*)    RBC / HPF >50 (*)    WBC, UA >50 (*)    All other components within normal limits  TSH - Abnormal; Notable for the following components:   TSH 12.208 (*)    All other components within normal limits  T4, FREE - Abnormal; Notable for the following components:   Free T4 0.72 (*)    All other components within normal limits  CBG MONITORING, ED - Abnormal; Notable for the following components:   Glucose-Capillary 140 (*)    All other components within normal limits  ETHANOL  PROTIME-INR  APTT   DIFFERENTIAL  RAPID URINE DRUG SCREEN, HOSP PERFORMED  TROPONIN I  VITAMIN B12  AMMONIA  C-REACTIVE PROTEIN  SEDIMENTATION RATE  RPR  I-STAT CREATININE, ED    EKG EKG Interpretation  Date/Time:  Friday Jul 12 2018 17:29:36 EDT Ventricular Rate:  64 PR Interval:    QRS Duration: 88 QT Interval:  470 QTC Calculation: 485 R Axis:   -53 Text Interpretation:  Sinus rhythm Inferior infarct, old No significant change since last tracing Confirmed by Melene PlanFloyd, Taevon Aschoff [phone]([id number]) on 07/12/2018 8:20:06 PM   Radiology Dg Chest Port 1 View  Result Date: 07/12/2018 CLINICAL DATA:  Weakness and altered mental status EXAM: PORTABLE CHEST 1 VIEW COMPARISON:  08/29/2014 FINDINGS: The patient's chin obscures the right lung apex. Atherosclerotic calcification of the aortic arch. Heart size within normal limits. The lungs appear clear. No blunting of the costophrenic angles. IMPRESSION: 1.  No active cardiopulmonary disease is radiographically apparent. 2.  Aortic Atherosclerosis (ICD10-I70.0). Electronically Signed   By: Gaylyn RongWalter  Liebkemann M.D.   On: 07/12/2018 18:44   Ct Head Code Stroke Wo Contrast  Result Date: 07/12/2018 CLINICAL DATA:  Code stroke.  Left neglect. EXAM: CT HEAD WITHOUT CONTRAST TECHNIQUE: Contiguous axial images were obtained from the base of the skull through the vertex without intravenous contrast. COMPARISON:  Head CT 08/27/2014 and MRI 08/29/2014 FINDINGS: Brain: There is no evidence of acute infarct, intracranial hemorrhage, mass, midline shift, or extra-axial fluid collection. Cerebral atrophy is again noted including prominent mesial temporal lobe atrophy. Patchy cerebral white matter hypodensities may have mildly progressed and are nonspecific but compatible with mild-to-moderate chronic small vessel ischemic disease. Vascular: Calcified atherosclerosis at the skull base. No hyperdense vessel. Skull: No fracture or focal osseous lesion. Sinuses/Orbits: Minimal mucosal thickening partially  visualized in the right maxillary sinus. Clear mastoid air cells. Bilateral cataract extraction. Other: None. ASPECTS [facility]([place] Stroke Program Early CT Score) - Ganglionic level infarction (caudate, lentiform nuclei, internal capsule, insula, M1-M3 cortex): 7 - Supraganglionic infarction (M4-M6 cortex): 3 Total score (0-10 with 10 being normal): 10 IMPRESSION: 1. No evidence of acute intracranial abnormality. 2. ASPECTS is 10. 3. Chronic small vessel ischemic disease and cerebral atrophy. These results were communicated to Dr. Otelia LimesLindzen at 5:39 pm on 07/12/2018 by text page via the [facility] messaging system. Electronically Signed   By: Sebastian AcheAllen  Grady M.D.   On: 07/12/2018 17:40    Procedures Procedures (including critical care time)  Medications Ordered in ED Medications  levothyroxine (SYNTHROID) tablet 25 mcg (has no administration in time range)  sodium chloride 0.9 % bolus 1,000 mL (0 mLs Intravenous Stopped 07/12/18 2045)     Initial Impression / Assessment and Plan / ED Course  I have reviewed the triage vital signs and the nursing notes.  Pertinent labs & imaging results that were available during my care of the patient were reviewed by me and considered in my medical decision making (see chart for details).        85 yo F with a chief complaint of altered mental status.  This was acute in nature and occurred about 90 minutes ago.  On my exam the patient has rightward eye deviation she will try and roll herself off the bed and has some spontaneous movement with her lower extremities.  I am unsure if the patient is acutely seizing versus if she has a new acute neurologic insult.  Will activate a code stroke as she will not look to the left.  Seen by neurology emergently felt this unlikely was due to a stroke.  Did recommend an EEG and an MRI.  Other lab work was also ordered including a TSH which ended up being significantly elevated.  Hypothyroidism could explain the patient's altered mental  status hypothermia borderline bradycardia.  She did improve  significantly with IV fluids.  We will give an oral dose of levothyroxine.  Discussed with the hospitalist who will come evaluate the patient for admission.  CRITICAL CARE Performed by: Rae Roam   Total critical care time: 35 minutes  Critical care time was exclusive of separately billable procedures and treating other patients.  Critical care was necessary to treat or prevent imminent or life-threatening deterioration.  Critical care was time spent personally by me on the following activities: development of treatment plan with patient and/or surrogate as well as nursing, discussions with consultants, evaluation of patient's response to treatment, examination of patient, obtaining history from patient or surrogate, ordering and performing treatments and interventions, ordering and review of laboratory studies, ordering and review of radiographic studies, pulse oximetry and re-evaluation of patient's condition.  The patients results and plan were reviewed and discussed.   Any x-rays performed were independently reviewed by myself.   Differential diagnosis were considered with the presenting HPI.  Medications  levothyroxine (SYNTHROID) tablet 25 mcg (has no administration in time range)  sodium chloride 0.9 % bolus 1,000 mL (0 mLs Intravenous Stopped 07/12/18 2045)    Vitals:   07/12/18 2037 07/12/18 2145 07/12/18 2200 07/12/18 2215  BP: 130/66 (!) 124/59 (!) 129/54 (!) 134/57  Pulse: 75  78   Resp: 12 15 (!) 21 14  Temp:      TempSrc:      SpO2: 100% 100% 100% 100%    Final diagnoses:  Myxedema coma (HCC)  Dehydration    Admission/ observation were discussed with the admitting physician, patient and/or family and they are comfortable with the plan.   Final Clinical Impressions(s) / ED Diagnoses   Final diagnoses:  Myxedema coma [facility])  Dehydration    ED Discharge Orders    None       Melene Plan,  DO 07/12/18 2252

## 2018-07-12 NOTE — Consult Note (Signed)
NEURO HOSPITALIST CONSULT NOTE   Requesting physician: Dr. Tyrone Nine  Reason for Consult: Acute unresponsiveness  History obtained from:   Chart    HPI:                                                                                                                                          Marcia Campbell is an 85 y.o. female with dementia, DM and hypercholesterolemia who presents to the ED with acute unresponsiveness. Per her daughter she was at her baseline at 3:30 PM this afternoon, which consists of being able to talk on occasion with her caregiven and bear a little weight when transferring from bed to wheelchair. She became acutely unresponsive this afternoon and was brought to the ED. A Code Stroke was called at 1708. STAT CT showed severe diffuse cerebral atrophy, including severe bilateral hippocampal atrophy. In CT, her extremities were contracted x 4 and hyperreflexic, in conjuncton with foreshortened flexor tendons of her upper and lower extremities and moderate to severely diffusely decreased muscle bulk x 4, consistent with chronic UMN deficit.  Past Medical History:  Diagnosis Date  . Anemia, unspecified 05/28/2013  . Dementia ([place])   . Diabetes mellitus   . Hypercholesteremia     Past Surgical History:  Procedure Laterality Date  . ABDOMINAL HYSTERECTOMY    . BOWEL RESECTION    . BREAST REDUCTION SURGERY      Family History  Problem Relation Age of Onset  . Diabetes Sister   . Cancer Daughter               Social History:  reports that she has never smoked. She has never used smokeless tobacco. She reports that she does not drink alcohol or use drugs.  Allergies  Allergen Reactions  . Penicillins Itching    Has patient had a PCN reaction causing immediate rash, facial/tongue/throat swelling, SOB or lightheadedness with hypotension: Yes Has patient had a PCN reaction causing severe rash involving mucus membranes or skin necrosis: No Has patient  had a PCN reaction that required hospitalization No Has patient had a PCN reaction occurring within the last 10 years: No If all of the above answers are "NO", then may proceed with Cephalosporin use.     HOME MEDICATIONS:  Tylenol ASA Insulin Boost Imodium Seroquel   ROS:                                                                                                                                       Unable to obtain due to AMS.    Blood pressure (!) 119/59, pulse 62, temperature (!) 95.9 F (35.5 C), temperature source Rectal, resp. rate 15, SpO2 100 %.   General Examination:                                                                                                       Physical Exam  HEENT-  [place]/AT. Temporalis muscle atrophy bilaterally   Lungs- Respirations unlabored Extremities- No edema. Diffusely decreased muscle bulk x 4   Neurological Examination Mental Status: Obtunded. Not following any commands. No purposeful movements. Will open eyes briefly to sternal rub but will not gaze towards or away from visual stimuli. Nonverbal with no attempts to communicate. Not localizing to any noxious stimuli.  Cranial Nerves: II: PERRL. No blink to threat bilaterally  III,IV, VI: Roving EOM are conjugate and cross the midline to left and right. No forced gaze deviation or nystagmus.  V,VII: Grimace to noxious is symmetric.  VIII: No response to auditory stimuli IX,X: Does not open mouth for evaluation XI: No gross asymmetry XII: Does not protrude tongue to command Motor/Sensory: Flexion contractures at knees and elbows bilaterally in conjunction with diffuse proximal and distal muscle atrophy appears most consistent with chronicity.  Will flex upper and lower extremities further than her baseline contracted posture, with noxious stimuli. No gross asymmetry  noted.  Increased flexor tone In upper and lower extremities bilaterally.  Deep Tendon Reflexes: 3+ brachioradialis, biceps and patellae bilaterally. 0 achilles bilaterally Plantars: Equivocal bilaterally  Cerebellar/Gait: Unable to assess   Lab Results: Basic Metabolic Panel: Recent Labs  Lab 07/12/18 1711 07/12/18 1718  NA 143  --   K 3.8  --   CL 111  --   CO2 18*  --   GLUCOSE 148*  --   BUN 18  --   CREATININE 1.14* 1.00  CALCIUM 9.1  --     CBC: Recent Labs  Lab 07/12/18 1711  WBC 5.4  NEUTROABS 2.8  HGB 10.5*  HCT 32.9*  MCV 97.3  PLT 264    Cardiac Enzymes: Recent Labs  Lab 07/12/18 1711  TROPONINI <0.03    Lipid Panel: No results for input(s): CHOL, TRIG, HDL, CHOLHDL, VLDL,  [place] in the last 168 hours.  Imaging: Ct Head Code Stroke Wo Contrast  Result Date: 07/12/2018 CLINICAL DATA:  Code stroke.  Left neglect. EXAM: CT HEAD WITHOUT CONTRAST TECHNIQUE: Contiguous axial images were obtained from the base of the skull through the vertex without intravenous contrast. COMPARISON:  Head CT 08/27/2014 and MRI 08/29/2014 FINDINGS: Brain: There is no evidence of acute infarct, intracranial hemorrhage, mass, midline shift, or extra-axial fluid collection. Cerebral atrophy is again noted including prominent mesial temporal lobe atrophy. Patchy cerebral white matter hypodensities may have mildly progressed and are nonspecific but compatible with mild-to-moderate chronic small vessel ischemic disease. Vascular: Calcified atherosclerosis at the skull base. No hyperdense vessel. Skull: No fracture or focal osseous lesion. Sinuses/Orbits: Minimal mucosal thickening partially visualized in the right maxillary sinus. Clear mastoid air cells. Bilateral cataract extraction. Other: None. ASPECTS [facility] Stroke Program Early CT Score) - Ganglionic level infarction (caudate, lentiform nuclei, internal capsule, insula, M1-M3 cortex): 7 - Supraganglionic infarction (M4-M6 cortex):  3 Total score (0-10 with 10 being normal): 10 IMPRESSION: 1. No evidence of acute intracranial abnormality. 2. ASPECTS is 10. 3. Chronic small vessel ischemic disease and cerebral atrophy. These results were communicated to Dr. Cheral Marker at 5:39 pm on 07/12/2018 by text page via the [facility] messaging system. Electronically Signed   By: Logan Bores M.D.   On: 07/12/2018 17:40    Assessment: 85 year old female with acute onset of unresponsiveness 1. Motor exam shows chronic UMN deficit involving all 4 extremities with diffuse muscle atrophy, contractures and hyperreflexia. No asymmetry is noted.  2. Obtunded on mental status examination. She does not localize to noxious stimuli.  3. Severe diffuse cerebral atrophy, most marked in the hippocampi, is seen on CT. No acute abnormality is appreciated.  4. Overall clinical picture is most consistent with acute encephalopathy on an underlying dementia. DDx for acute precipitating factor includes infection, toxic and metabolic etiologies. A cognitive fluctuation without a precipitant is possible if a component of her dementing process is due to Lewy body dementia. No adventitious movements to suggest seizure, but will rule out subclinical seizure with EEG.  5. Will also need MRI brain following EEG.   Recommendations: 1. MRI brain 2. STAT EEG. EEG technician has been called.  3. Toxic/metabolic work up to include TSH, C-reactive protein, ESR, RPR, ammonia, LFTs and B12 level. 4. Close monitoring.    Electronically signed: Dr. Kerney Elbe 07/12/2018, 6:20 PM

## 2018-07-12 NOTE — Progress Notes (Signed)
Prelim EEG negative for status epilepticus or frank seizures. Formal read in the AM.

## 2018-07-12 NOTE — Progress Notes (Signed)
EEG Complete  Results Pending 

## 2018-07-12 NOTE — Code Documentation (Signed)
Per daughter she was at her baseline at 3:30pm this afternoon.  She can bear a little weight when transferring from bed to wheelchair or chair.  She talks on occasion more with caregiver than with daughter per daughter.  This afternoon she became unresponsive. Code stroke called at 1708 labs and stat head CT done.  Upon assessment she will open her eyes to sternal rub, not follow commands occasionalyl moan to pain.  Her extremities appear contracted.  Dr Otelia Limes at bedside to assess patient.  Code Stroke canceled.

## 2018-07-12 NOTE — ED Triage Notes (Signed)
Pt here from home unresponsive and hypotension , cbg 206 , b/p slightly up with 500 NS

## 2018-07-12 NOTE — ED Notes (Signed)
ED TO INPATIENT HANDOFF REPORT  ED Nurse Name and Phone #: Morrie Sheldon [phone]  Campbell Name/Age/Gender Marcia Roys 85 y.o. female Room/Bed: 034C/034C  Code Status   Code Status: Prior  Home/SNF/Other Home Patient oriented to: self and place Is this baseline? Yes   Triage Complete: Triage complete  Chief Complaint Unresponsive,Hypotensive  Triage Note Pt here from home unresponsive and hypotension , cbg 206 , b/p slightly up with 500 NS    Allergies Allergies  Allergen Reactions  . Penicillins Itching    Has patient had a PCN reaction causing immediate rash, facial/tongue/throat swelling, SOB or lightheadedness with hypotension: Yes Has patient had a PCN reaction causing severe rash involving mucus membranes or skin necrosis: No Has patient had a PCN reaction that required hospitalization No Has patient had a PCN reaction occurring within the last 10 years: No If all of the above answers are "NO", then may proceed with Cephalosporin use.     Level of Care/Admitting Diagnosis ED Disposition    ED Disposition Condition Comment   Admit  Hospital Area: [facility] [[id number]]  Level of Care: Telemetry Medical [104]  I expect the patient will be discharged within 24 hours: Yes  LOW acuity---Tx typically complete <24 hrs---ACUTE conditions typically can be evaluated <24 hours---LABS likely to return to acceptable levels <24 hours---IS near functional baseline---EXPECTED to return to current living arrangement---NOT newly hypoxic: Meets criteria for 5C-Observation unit  Covid Evaluation: N/A  Diagnosis: AMS (altered mental status) [[id number]]  Admitting Physician: Rometta Emery [[id number]]  Attending Physician: Rometta Emery [[id number]]  PT Class (Do Not Modify): Observation [104]  PT Acc Code (Do Not Modify): Observation [10022]       B Medical/Surgery History Past Medical History:  Diagnosis Date  . Anemia, unspecified 05/28/2013  . Dementia ([facility])   . Diabetes  mellitus   . Hypercholesteremia    Past Surgical History:  Procedure Laterality Date  . ABDOMINAL HYSTERECTOMY    . BOWEL RESECTION    . BREAST REDUCTION SURGERY       A IV Location/Drains/Wounds Patient Lines/Drains/Airways Status   Active Line/Drains/Airways    Name:   Placement date:   Placement time:   Site:   Days:   Peripheral IV 07/12/18 Left Wrist   07/12/18    1706    Wrist   less than 1          Intake/Output Last 24 hours  Intake/Output Summary (Last 24 hours) at 07/12/2018 2152 Last data filed at 07/12/2018 2045 Gross per 24 hour  Intake 1000 ml  Output -  Net 1000 ml    Labs/Imaging Results for orders placed or performed during the hospital encounter of 07/12/18 (from the past 48 hour(Campbell))  CBG monitoring, ED     Status: Abnormal   Collection Time: 07/12/18  5:10 PM  Result Value Ref Range   Glucose-Capillary 140 (H) 70 - 99 mg/dL  Ethanol     Status: None   Collection Time: 07/12/18  5:11 PM  Result Value Ref Range   Alcohol, Ethyl (B) <10 <10 mg/dL    Comment: (NOTE) Lowest detectable limit for serum alcohol is 10 mg/dL. For medical purposes only. Performed at [facility], [address], [place], [place] [address]   Protime-INR     Status: None   Collection Time: 07/12/18  5:11 PM  Result Value Ref Range   Prothrombin Time 14.1 11.4 - 15.2 seconds   INR 1.1 0.8 - 1.2  Comment: (NOTE) INR goal varies based on device and disease states. Performed at [facility], [address] [place], [place] [address]   APTT     Status: None   Collection Time: 07/12/18  5:11 PM  Result Value Ref Range   aPTT 31 24 - 36 seconds    Comment: Performed at [facility], [address] [place], [place] [address]  CBC     Status: Abnormal   Collection Time: 07/12/18  5:11 PM  Result Value Ref Range   WBC 5.4 4.0 - 10.5 K/uL   RBC 3.38 (L) 3.87 - 5.11 MIL/uL   Hemoglobin 10.5 (L) 12.0 - 15.0 g/dL   HCT 82.932.9 (L) 56.236.0 - 13.046.0 %   MCV 97.3  80.0 - 100.0 fL   MCH 31.1 26.0 - 34.0 pg   MCHC 31.9 30.0 - 36.0 g/dL   RDW 86.513.2 78.411.5 - 69.615.5 %   Platelets 264 150 - 400 K/uL   nRBC 0.0 0.0 - 0.2 %    Comment: Performed at [facility], [address] [place], [place] [address]  Differential     Status: None   Collection Time: 07/12/18  5:11 PM  Result Value Ref Range   Neutrophils Relative % 51 %   Neutro Abs 2.8 1.7 - 7.7 K/uL   Lymphocytes Relative 40 %   Lymphs Abs 2.2 0.7 - 4.0 K/uL   Monocytes Relative 6 %   Monocytes Absolute 0.3 0.1 - 1.0 K/uL   Eosinophils Relative 2 %   Eosinophils Absolute 0.1 0.0 - 0.5 K/uL   Basophils Relative 0 %   Basophils Absolute 0.0 0.0 - 0.1 K/uL   Immature Granulocytes 1 %   Abs Immature Granulocytes 0.03 0.00 - 0.07 K/uL    Comment: Performed at [facility], [address] [place], [place] [address]  Comprehensive metabolic panel     Status: Abnormal   Collection Time: 07/12/18  5:11 PM  Result Value Ref Range   Sodium 143 135 - 145 mmol/L   Potassium 3.8 3.5 - 5.1 mmol/L   Chloride 111 98 - 111 mmol/L   CO2 18 (L) 22 - 32 mmol/L   Glucose, Bld 148 (H) 70 - 99 mg/dL   BUN 18 8 - 23 mg/dL   Creatinine, Ser 4.011.14 (H) 0.44 - 1.00 mg/dL   Calcium 9.1 8.9 - 02.710.3 mg/dL   Total Protein 6.4 (L) 6.5 - 8.1 g/dL   Albumin 3.3 (L) 3.5 - 5.0 g/dL   AST 16 15 - 41 U/L   ALT 10 0 - 44 U/L   Alkaline Phosphatase 48 38 - 126 U/L   Total Bilirubin 0.6 0.3 - 1.2 mg/dL   GFR calc non Af Amer 41 (L) >60 mL/min   GFR calc Af Amer 48 (L) >60 mL/min   Anion gap 14 5 - 15    Comment: Performed at [facility], [address] [place], [place] [address]  Troponin I - ONCE - STAT     Status: None   Collection Time: 07/12/18  5:11 PM  Result Value Ref Range   Troponin I <0.03 <0.03 ng/mL    Comment: Performed at [facility], [address] [place], [place] [address]  I-stat Creatinine, ED     Status: None   Collection Time: 07/12/18  5:18 PM  Result Value Ref Range    Creatinine, Ser 1.00 0.44 - 1.00 mg/dL  Vitamin [id number]  Status: None   Collection Time: 07/12/18  8:03 PM  Result Value Ref Range   Vitamin B-12 299 180 - 914 pg/mL    Comment: (NOTE) This assay is not validated for testing neonatal or myeloproliferative syndrome specimens for Vitamin B12 levels. Performed at [facility], [address] [place], [place] [address]   TSH     Status: Abnormal   Collection Time: 07/12/18  8:03 PM  Result Value Ref Range   TSH 12.208 (H) 0.350 - 4.500 uIU/mL    Comment: Performed by a 3rd Generation assay with a functional sensitivity of <=0.01 uIU/mL. Performed at [facility], [address], [place], [place] [address]   Ammonia     Status: None   Collection Time: 07/12/18  8:03 PM  Result Value Ref Range   Ammonia 16 9 - 35 umol/L    Comment: Performed at [facility], [address] [place], [place] [address]  C-reactive protein     Status: None   Collection Time: 07/12/18  8:03 PM  Result Value Ref Range   CRP <0.8 <1.0 mg/dL    Comment: Performed at [facility], [address] [place], [place] [address]  Sedimentation rate     Status: None   Collection Time: 07/12/18  8:03 PM  Result Value Ref Range   Sed Rate 19 0 - 22 mm/hr    Comment: Performed at [facility], [address] [place], [place] [address]  Urine rapid drug screen (hosp performed)     Status: None   Collection Time: 07/12/18  8:40 PM  Result Value Ref Range   Opiates NONE DETECTED NONE DETECTED   Cocaine NONE DETECTED NONE DETECTED   Benzodiazepines NONE DETECTED NONE DETECTED   Amphetamines NONE DETECTED NONE DETECTED   Tetrahydrocannabinol NONE DETECTED NONE DETECTED   Barbiturates NONE DETECTED NONE DETECTED    Comment: (NOTE) DRUG SCREEN FOR MEDICAL PURPOSES ONLY.  IF CONFIRMATION IS NEEDED FOR ANY PURPOSE, NOTIFY LAB WITHIN 5 DAYS. LOWEST DETECTABLE LIMITS FOR URINE DRUG SCREEN Drug Class                     Cutoff  (ng/mL) Amphetamine and metabolites    1000 Barbiturate and metabolites    200 Benzodiazepine                 200 Tricyclics and metabolites     300 Opiates and metabolites        300 Cocaine and metabolites        300 THC                            50 Performed at [facility], [address] [place], [place] [address]   Urinalysis, Routine w reflex microscopic     Status: Abnormal   Collection Time: 07/12/18  8:40 PM  Result Value Ref Range   Color, Urine YELLOW YELLOW   APPearance HAZY (A) CLEAR   Specific Gravity, Urine 1.016 1.005 - 1.030   pH 5.0 5.0 - 8.0   Glucose, UA NEGATIVE NEGATIVE mg/dL   Hgb urine dipstick MODERATE (A) NEGATIVE   Bilirubin Urine NEGATIVE NEGATIVE   Ketones, ur 5 (A) NEGATIVE mg/dL   Protein, ur NEGATIVE NEGATIVE mg/dL   Nitrite NEGATIVE NEGATIVE   Leukocytes,Ua MODERATE (A) NEGATIVE   RBC / HPF >50 (H) 0 - 5 RBC/hpf  WBC, UA >50 (H) 0 - 5 WBC/hpf   Bacteria, UA NONE SEEN NONE SEEN   Squamous Epithelial / LPF 0-5 0 - 5   WBC Clumps PRESENT    Mucus PRESENT    Hyaline Casts, UA PRESENT     Comment: Performed at [facility], [address], [place], [place] [address]   Dg Chest Port 1 View  Result Date: 07/12/2018 CLINICAL DATA:  Weakness and altered mental status EXAM: PORTABLE CHEST 1 VIEW COMPARISON:  08/29/2014 FINDINGS: The patient'Campbell chin obscures the right lung apex. Atherosclerotic calcification of the aortic arch. Heart size within normal limits. The lungs appear clear. No blunting of the costophrenic angles. IMPRESSION: 1.  No active cardiopulmonary disease is radiographically apparent. 2.  Aortic Atherosclerosis (ICD10-I70.0). Electronically Signed   By: Gaylyn Rong M.D.   On: 07/12/2018 18:44   Ct Head Code Stroke Wo Contrast  Result Date: 07/12/2018 CLINICAL DATA:  Code stroke.  Left neglect. EXAM: CT HEAD WITHOUT CONTRAST TECHNIQUE: Contiguous axial images were obtained from the base of the skull through the vertex  without intravenous contrast. COMPARISON:  Head CT 08/27/2014 and MRI 08/29/2014 FINDINGS: Brain: There is no evidence of acute infarct, intracranial hemorrhage, mass, midline shift, or extra-axial fluid collection. Cerebral atrophy is again noted including prominent mesial temporal lobe atrophy. Patchy cerebral white matter hypodensities may have mildly progressed and are nonspecific but compatible with mild-to-moderate chronic small vessel ischemic disease. Vascular: Calcified atherosclerosis at the skull base. No hyperdense vessel. Skull: No fracture or focal osseous lesion. Sinuses/Orbits: Minimal mucosal thickening partially visualized in the right maxillary sinus. Clear mastoid air cells. Bilateral cataract extraction. Other: None. ASPECTS [facility] Stroke Program Early CT Score) - Ganglionic level infarction (caudate, lentiform nuclei, internal capsule, insula, M1-M3 cortex): 7 - Supraganglionic infarction (M4-M6 cortex): 3 Total score (0-10 with 10 being normal): 10 IMPRESSION: 1. No evidence of acute intracranial abnormality. 2. ASPECTS is 10. 3. Chronic small vessel ischemic disease and cerebral atrophy. These results were communicated to Dr. Otelia Limes at 5:39 pm on 07/12/2018 by text page via the [facility] messaging system. Electronically Signed   By: Sebastian Ache M.D.   On: 07/12/2018 17:40    Pending Labs Unresulted Labs (From admission, onward)    Start     Ordered   07/12/18 2056  T4, free  ONCE - STAT,   STAT     07/12/18 2055   07/12/18 1842  RPR  ONCE - STAT,   STAT     07/12/18 1841          Vitals/Pain Today'Campbell Vitals   07/12/18 1900 07/12/18 1930 07/12/18 2000 07/12/18 2037  BP: 134/62 136/66 138/61 130/66  Pulse: 69 69 73 75  Resp: 13 12 15 12   Temp:      TempSrc:      SpO2: 100% 100% 100% 100%    Isolation Precautions No active isolations  Medications Medications  levothyroxine (SYNTHROID) tablet 25 mcg (has no administration in time range)  sodium chloride 0.9 % bolus  1,000 mL (0 mLs Intravenous Stopped 07/12/18 2045)    Mobility non-ambulatory High fall risk   Focused Assessments Cardiac Assessment Handoff:  Cardiac Rhythm: Sinus bradycardia Lab Results  Component Value Date   TROPONINI <0.03 07/12/2018   No results found for: DDIMER Does the Patient currently have chest pain? No  , Pulmonary Assessment Handoff:  Lung sounds:   O2 Device: Room Air        R Recommendations: See Admitting  Provider Note  Report given to:   Additional Notes: Hx of dementia. Lives at home with Daughter

## 2018-07-13 DIAGNOSIS — Z794 Long term (current) use of insulin: Secondary | ICD-10-CM | POA: Diagnosis not present

## 2018-07-13 DIAGNOSIS — R4182 Altered mental status, unspecified: Secondary | ICD-10-CM | POA: Diagnosis not present

## 2018-07-13 DIAGNOSIS — E119 Type 2 diabetes mellitus without complications: Secondary | ICD-10-CM | POA: Diagnosis not present

## 2018-07-13 DIAGNOSIS — F0390 Unspecified dementia without behavioral disturbance: Secondary | ICD-10-CM | POA: Diagnosis not present

## 2018-07-13 DIAGNOSIS — E039 Hypothyroidism, unspecified: Secondary | ICD-10-CM | POA: Diagnosis not present

## 2018-07-13 LAB — GLUCOSE, CAPILLARY
Glucose-Capillary: 120 mg/dL — ABNORMAL HIGH (ref 70–99)
Glucose-Capillary: 130 mg/dL — ABNORMAL HIGH (ref 70–99)
Glucose-Capillary: 138 mg/dL — ABNORMAL HIGH (ref 70–99)
Glucose-Capillary: 51 mg/dL — ABNORMAL LOW (ref 70–99)
Glucose-Capillary: 77 mg/dL (ref 70–99)
Glucose-Capillary: 79 mg/dL (ref 70–99)
Glucose-Capillary: 85 mg/dL (ref 70–99)
Glucose-Capillary: 86 mg/dL (ref 70–99)

## 2018-07-13 LAB — COMPREHENSIVE METABOLIC PANEL
ALT: 11 U/L (ref 0–44)
AST: 18 U/L (ref 15–41)
Albumin: 3.4 g/dL — ABNORMAL LOW (ref 3.5–5.0)
Alkaline Phosphatase: 51 U/L (ref 38–126)
Anion gap: 11 (ref 5–15)
BUN: 18 mg/dL (ref 8–23)
CO2: 18 mmol/L — ABNORMAL LOW (ref 22–32)
Calcium: 9.2 mg/dL (ref 8.9–10.3)
Chloride: 111 mmol/L (ref 98–111)
Creatinine, Ser: 1.01 mg/dL — ABNORMAL HIGH (ref 0.44–1.00)
GFR calc Af Amer: 55 mL/min — ABNORMAL LOW (ref >60)
GFR calc non Af Amer: 48 mL/min — ABNORMAL LOW (ref >60)
Glucose, Bld: 151 mg/dL — ABNORMAL HIGH (ref 70–99)
Potassium: 4.1 mmol/L (ref 3.5–5.1)
Sodium: 140 mmol/L (ref 135–145)
Total Bilirubin: 0.5 mg/dL (ref 0.3–1.2)
Total Protein: 6.3 g/dL — ABNORMAL LOW (ref 6.5–8.1)

## 2018-07-13 LAB — CBC
HCT: 30.8 % — ABNORMAL LOW (ref 36.0–46.0)
Hemoglobin: 10 g/dL — ABNORMAL LOW (ref 12.0–15.0)
MCH: 31 pg (ref 26.0–34.0)
MCHC: 32.5 g/dL (ref 30.0–36.0)
MCV: 95.4 fL (ref 80.0–100.0)
Platelets: 255 10*3/uL (ref 150–400)
RBC: 3.23 MIL/uL — ABNORMAL LOW (ref 3.87–5.11)
RDW: 13 % (ref 11.5–15.5)
WBC: 8.3 10*3/uL (ref 4.0–10.5)
nRBC: 0 % (ref 0.0–0.2)

## 2018-07-13 LAB — RPR: RPR Ser Ql: NONREACTIVE

## 2018-07-13 LAB — HEMOGLOBIN A1C
Hgb A1c MFr Bld: 6.1 % — ABNORMAL HIGH (ref 4.8–5.6)
Mean Plasma Glucose: 128.37 mg/dL

## 2018-07-13 MED ORDER — ENOXAPARIN SODIUM 30 MG/0.3ML ~~LOC~~ SOLN
30.0000 mg | SUBCUTANEOUS | Status: DC
  Administered 2018-07-13 – 2018-07-17 (×5): 30 mg via SUBCUTANEOUS
  Filled 2018-07-13 (×5): qty 0.3

## 2018-07-13 MED ORDER — BOOST PLUS PO LIQD
237.0000 mL | Freq: Every day | ORAL | Status: DC | PRN
  Administered 2018-07-14: 237 mL via ORAL
  Filled 2018-07-13 (×2): qty 237

## 2018-07-13 MED ORDER — ENOXAPARIN SODIUM 40 MG/0.4ML ~~LOC~~ SOLN: 40.0000 mg | Freq: Every day | SUBCUTANEOUS | Status: DC

## 2018-07-13 MED ORDER — ACETAMINOPHEN 325 MG PO TABS: 650.0000 mg | ORAL_TABLET | Freq: Four times a day (QID) | ORAL | Status: DC | PRN

## 2018-07-13 MED ORDER — INSULIN ASPART 100 UNIT/ML ~~LOC~~ SOLN
0.0000 [IU] | Freq: Three times a day (TID) | SUBCUTANEOUS | Status: DC
  Administered 2018-07-13: 1 [IU] via SUBCUTANEOUS
  Administered 2018-07-14: 2 [IU] via SUBCUTANEOUS
  Administered 2018-07-15: 7 [IU] via SUBCUTANEOUS
  Administered 2018-07-15: 17:00:00 1 [IU] via SUBCUTANEOUS
  Administered 2018-07-16: 2 [IU] via SUBCUTANEOUS
  Administered 2018-07-16: 13:00:00 1 [IU] via SUBCUTANEOUS
  Administered 2018-07-16: 3 [IU] via SUBCUTANEOUS
  Administered 2018-07-17: 07:00:00 2 [IU] via SUBCUTANEOUS
  Administered 2018-07-17: 3 [IU] via SUBCUTANEOUS

## 2018-07-13 MED ORDER — ONDANSETRON HCL 4 MG/2ML IJ SOLN: 4.0000 mg | Freq: Four times a day (QID) | INTRAMUSCULAR | Status: DC | PRN

## 2018-07-13 MED ORDER — QUETIAPINE FUMARATE 25 MG PO TABS
25.0000 mg | ORAL_TABLET | Freq: Every day | ORAL | Status: DC
  Administered 2018-07-13 (×2): 25 mg via ORAL
  Filled 2018-07-13 (×2): qty 1

## 2018-07-13 MED ORDER — INSULIN ASPART 100 UNIT/ML ~~LOC~~ SOLN
0.0000 [IU] | Freq: Every day | SUBCUTANEOUS | Status: DC
  Administered 2018-07-14: 3 [IU] via SUBCUTANEOUS
  Administered 2018-07-15: 2 [IU] via SUBCUTANEOUS

## 2018-07-13 MED ORDER — INSULIN GLARGINE 100 UNIT/ML ~~LOC~~ SOLN
12.0000 [IU] | Freq: Every day | SUBCUTANEOUS | Status: DC
  Administered 2018-07-13: 12 [IU] via SUBCUTANEOUS
  Filled 2018-07-13 (×2): qty 0.12

## 2018-07-13 MED ORDER — ONDANSETRON HCL 4 MG PO TABS: 4.0000 mg | ORAL_TABLET | Freq: Four times a day (QID) | ORAL | Status: DC | PRN

## 2018-07-13 MED ORDER — GLUCAGON HCL RDNA (DIAGNOSTIC) 1 MG IJ SOLR
INTRAMUSCULAR | Status: AC
  Filled 2018-07-13: qty 1

## 2018-07-13 MED ORDER — ASPIRIN 81 MG PO CHEW
81.0000 mg | CHEWABLE_TABLET | Freq: Every day | ORAL | Status: DC
  Administered 2018-07-13 – 2018-07-17 (×5): 81 mg via ORAL
  Filled 2018-07-13 (×5): qty 1

## 2018-07-13 MED ORDER — LOPERAMIDE HCL 2 MG PO CAPS: 2.0000 mg | ORAL_CAPSULE | ORAL | Status: DC | PRN

## 2018-07-13 MED ORDER — SODIUM CHLORIDE 0.9 % IV SOLN
INTRAVENOUS | Status: DC
  Administered 2018-07-13 (×2): via INTRAVENOUS

## 2018-07-13 MED ORDER — ENOXAPARIN SODIUM 300 MG/3ML IJ SOLN
20.0000 mg | INTRAMUSCULAR | Status: DC
  Filled 2018-07-13: qty 0.2

## 2018-07-13 MED ORDER — INSULIN GLARGINE 100 UNIT/ML ~~LOC~~ SOLN
10.0000 [IU] | Freq: Every day | SUBCUTANEOUS | Status: DC
  Filled 2018-07-13 (×2): qty 0.1

## 2018-07-13 NOTE — Progress Notes (Signed)
Hypoglycemic Event  CBG: 51  Treatment: 24 oz fruit juice  Symptoms: Unable to access  Follow-up CBG Result (1) : 79  CBG Result (2): 86 CBG Result (3): 120  MD notified  Marcia Campbell

## 2018-07-13 NOTE — Procedures (Signed)
ELECTROENCEPHALOGRAM REPORT   Patient: Marcia Campbell       Room #: 4O96E EEG No. ID: [id number] Age: 85 y.o.        Sex: female Referring Physician: Blake Divine Report Date:  07/13/2018        Interpreting Physician: Thana Farr  History: Marcia Campbell is an 85 y.o. female with altered mental status  Medications:  ASA, Seroquel, Insulin, Synthroid  Conditions of Recording:  This is a 21 channel routine scalp EEG performed with bipolar and monopolar montages arranged in accordance to the international 10/20 system of electrode placement. One channel was dedicated to EKG recording.  The patient is in the poorly responsive state.  Description:  Artifact is prominent during the recording often obscuring the background rhythm. When able to be visualized the background is slow and poorly organized.   It consists of a low voltage, polymorphic delta rhythm that is diffusely distributed and continuous. No epileptiform activity is noted. Hyperventilation and intermittent photic stimulation were not performed.  IMPRESSION: This is an abnormal EEG secondary to general background slowing.  This finding may be seen with a diffuse disturbance that is etiologically nonspecific, but may include a metabolic encephalopathy, among other possibilities.  No epileptiform activity was noted.     Thana Farr, MD Neurology [phone] 07/13/2018, 8:34 AM

## 2018-07-13 NOTE — Care Management Obs Status (Signed)
MEDICARE OBSERVATION STATUS NOTIFICATION   Patient Details  Name: Marcia Campbell MRN: [id number] Date of Birth: [birth year]   Medicare Observation Status Notification Given:  Yes    Annalee Genta, LCSW 07/13/2018, 4:28 PM

## 2018-07-13 NOTE — Progress Notes (Signed)
PROGRESS NOTE    Marcia Campbell  ZDG:[id number] DOB: 1921-08-28 DOA: 07/12/2018 PCP: Pearline Cables, MD    Brief Narrative:  Marcia Campbell is a 85 y.o. female with medical history significant of dementia, diabetes, hyperlipidemia, hypertension and anemia of chronic disease who was brought in by family is due to her sudden onset of confusion.Patient was at her baseline which is dementia was talking and laughing watching TV.  MRI and MRA of the head shows MRI HEAD IMPRESSION:  Moderately motion degraded exam. No definite acute intracranial abnormality identified.  Advanced age-related cerebral atrophy with mild to moderate chronic microvascular ischemic disease.  Severely limited exam due to extensive motion artifact.  Gross patency of the major arterial vasculature within the head. No obvious large vessel occlusion. Evaluation for focal hemodynamically significant stenosis fairly limited on this exam.    Assessment & Plan:   Principal Problem:   AMS (altered mental status) Active Problems:   DM (diabetes mellitus) (HCC)   Hyperlipemia   Anemia, iron deficiency   Moderate dementia with behavioral disturbance (HCC)   Hypothyroidism   Acute metabolic encephalopathy:  Much improved. She is alert , but still not following commands.  MRI brain without contrast did not show any acute stroke.  EEG does not show any epileptiform activity.    Diabetes mellitus:  CBG (last 3)  Recent Labs    07/13/18 0908 07/13/18 0953 07/13/18 1106  GLUCAP 79 86 120*   Continue with SSI. Decrease the dose of lantus to 10units.     Hyperlipidemia:    Hypothyroidism:  Resume synthroid.    DVT prophylaxis: lovenox.  Code Status: full code.  Family Communication:  None at bedside Disposition Plan: pending PT/ot EVAL.  Consultants:   Neurology.   Procedures:EEG   Antimicrobials:none.    Subjective: No agitation on RA, alert and feeding with assistance. Only 40%  oral intake noticed so far.    Objective: Vitals:   07/13/18 0309 07/13/18 0524 07/13/18 0743 07/13/18 1210  BP: 140/76  (!) 100/46 129/71  Pulse: 83  84 80  Resp: Temp: 97.7 F (36.5 C)  98.6 F (37 C) 98.1 F (36.7 C)  TempSrc: Axillary  Oral Oral  SpO2: 99%  100% 100%  Weight:      Height:   (1.676 m)      Intake/Output Summary (Last 24 hours) at 07/13/2018 1348 Last data filed at 07/13/2018 0800 Gross per 24 hour  Intake 1439.37 ml  Output --  Net 1439.37 ml   Filed Weights   07/12/18 2347  Weight: 38.4 kg    Examination:  General exam: Appears calm and comfortable  Respiratory system: Clear to auscultation. Respiratory effort normal. Cardiovascular system: S1 & S2 heard, RRR.  No pedal edema. Gastrointestinal system: Abdomen is nondistended, soft and nontender. No organomegaly or masses felt. Normal bowel sounds heard. Central nervous system: AlerT , BUT NOT FOLLOWING Commands.  Extremities: no pedal edema.  Skin: No rashes, lesions or ulcers Psychiatry: . Mood & affect appropriate.     Data Reviewed: I have personally reviewed following labs and imaging studies  CBC: Recent Labs  Lab 07/12/18 1711 07/13/18 0036  WBC 5.4 8.3  NEUTROABS 2.8  --   HGB 10.5* 10.0*  HCT 32.9* 30.8*  MCV 97.3 95.4  PLT 264 255   Basic Metabolic Panel: Recent Labs  Lab 07/12/18 1711 07/12/18 1718 07/13/18 0036  NA 143  --  140  K 3.8  --  4.1  CL 111  --  111  CO2 18*  --  18*  GLUCOSE 148*  --  151*  BUN 18  --  18  CREATININE 1.14* 1.00 1.01*  CALCIUM 9.1  --  9.2   GFR: Estimated Creatinine Clearance: 20.6 mL/min (A) (by C-G formula based on SCr of 1.01 mg/dL (H)). Liver Function Tests: Recent Labs  Lab 07/12/18 1711 07/13/18 0036  AST 16 18  ALT 10 11  ALKPHOS 48 51  BILITOT 0.6 0.5  PROT 6.4* 6.3*  ALBUMIN 3.3* 3.4*   No results for input(s): LIPASE, AMYLASE in the last 168 hours. Recent Labs  Lab 07/12/18 2003  AMMONIA 16    Coagulation Profile: Recent Labs  Lab 07/12/18 1711  INR 1.1   Cardiac Enzymes: Recent Labs  Lab 07/12/18 1711  TROPONINI <0.03   BNP (last 3 results) No results for input(s): PROBNP in the last 8760 hours. HbA1C: Recent Labs    07/13/18 0036  HGBA1C 6.1*   CBG: Recent Labs  Lab 07/13/18 0616 07/13/18 0734 07/13/18 0908 07/13/18 0953 07/13/18 1106  GLUCAP 51* 85 79 86 120*   Lipid Profile: No results for input(s): CHOL, HDL, LDLCALC, TRIG, CHOLHDL, LDLDIRECT in the last 72 hours. Thyroid Function Tests: Recent Labs    07/12/18 2003 07/12/18 2116  TSH 12.208*  --   FREET4  --  0.72*   Anemia Panel: Recent Labs    07/12/18 2003  VITAMINB12 299   Sepsis Labs: No results for input(s): PROCALCITON, LATICACIDVEN in the last 168 hours.  No results found for this or any previous visit (from the past 240 hour(s)).       Radiology Studies: Mr Shirlee Latch EH Contrast  Result Date: 07/12/2018 CLINICAL DATA:  Initial evaluation for acute altered mental status, unresponsiveness. EXAM: MRI HEAD WITHOUT CONTRAST MRA HEAD WITHOUT CONTRAST TECHNIQUE: Multiplanar, multiecho pulse sequences of the brain and surrounding structures were obtained without intravenous contrast. Angiographic images of the head were obtained using MRA technique without contrast. COMPARISON:  Prior CT from earlier the same day. FINDINGS: MRI HEAD FINDINGS Brain: Examination moderately degraded by motion artifact. Diffuse prominence of the CSF containing spaces compatible with generalized cerebral atrophy, moderately advanced in nature. Mild to moderate chronic microvascular ischemic changes present within the periventricular and deep white matter both cerebral hemispheres. No abnormal foci of restricted diffusion to suggest acute or subacute ischemia. Gray-white matter differentiation maintained. No encephalomalacia to suggest chronic cortical infarction. No foci of susceptibility artifact to suggest  acute or chronic intracranial hemorrhage. No mass lesion, midline shift or mass effect. Diffuse ventricular prominence related global parenchymal volume loss without hydrocephalus. No extra-axial fluid collection. Vascular: Major intracranial vascular flow voids grossly maintained. Skull and upper cervical spine: Craniocervical junction within normal limits. No focal marrow replacing lesion. No scalp soft tissue abnormality. Sinuses/Orbits: Patient status post bilateral ocular lens replacement. Mild scattered mucosal thickening within the ethmoidal air cells. Paranasal sinuses are otherwise clear. No mastoid effusion. Inner ear structures normal. Other: None. MRA HEAD FINDINGS ANTERIOR CIRCULATION: Examination severely degraded by motion artifact. Patent antegrade flow seen within the distal cervical segments of the internal carotid arteries bilaterally. ICAs are patent to the termini without obvious flow-limiting stenosis. A1 segments grossly patent. Anterior communicating artery not well assessed. Anterior cerebral arteries not well assessed due to motion. M1 segments grossly patent bilaterally. MCA branches grossly perfused bilaterally and fairly symmetric, although poorly assessed due to motion. POSTERIOR CIRCULATION: Vertebral arteries patent to the vertebrobasilar  junction without obvious stenosis. Right vertebral artery dominant. Posterior inferior cerebral arteries not well seen. Basilar grossly patent to its distal aspect without obvious stenosis, although evaluation limited by motion. Superior cerebral arteries patent proximally. Both of the PCAs appear to be largely supplied via the basilar and are grossly perfused to their distal aspects. Evaluation for possible aneurysm fairly limited by motion. IMPRESSION: MRI HEAD IMPRESSION: 1. Moderately motion degraded exam. 2. No definite acute intracranial abnormality identified. 3. Advanced age-related cerebral atrophy with mild to moderate chronic microvascular  ischemic disease. MRA HEAD IMPRESSION: 1. Severely limited exam due to extensive motion artifact. 2. Gross patency of the major arterial vasculature within the head. No obvious large vessel occlusion. Evaluation for focal hemodynamically significant stenosis fairly limited on this exam. Electronically Signed   By: Rise MuBenjamin  McClintock M.D.   On: 07/12/2018 23:32   Mr Brain Wo Contrast  Result Date: 07/12/2018 CLINICAL DATA:  Initial evaluation for acute altered mental status, unresponsiveness. EXAM: MRI HEAD WITHOUT CONTRAST MRA HEAD WITHOUT CONTRAST TECHNIQUE: Multiplanar, multiecho pulse sequences of the brain and surrounding structures were obtained without intravenous contrast. Angiographic images of the head were obtained using MRA technique without contrast. COMPARISON:  Prior CT from earlier the same day. FINDINGS: MRI HEAD FINDINGS Brain: Examination moderately degraded by motion artifact. Diffuse prominence of the CSF containing spaces compatible with generalized cerebral atrophy, moderately advanced in nature. Mild to moderate chronic microvascular ischemic changes present within the periventricular and deep white matter both cerebral hemispheres. No abnormal foci of restricted diffusion to suggest acute or subacute ischemia. Gray-white matter differentiation maintained. No encephalomalacia to suggest chronic cortical infarction. No foci of susceptibility artifact to suggest acute or chronic intracranial hemorrhage. No mass lesion, midline shift or mass effect. Diffuse ventricular prominence related global parenchymal volume loss without hydrocephalus. No extra-axial fluid collection. Vascular: Major intracranial vascular flow voids grossly maintained. Skull and upper cervical spine: Craniocervical junction within normal limits. No focal marrow replacing lesion. No scalp soft tissue abnormality. Sinuses/Orbits: Patient status post bilateral ocular lens replacement. Mild scattered mucosal thickening  within the ethmoidal air cells. Paranasal sinuses are otherwise clear. No mastoid effusion. Inner ear structures normal. Other: None. MRA HEAD FINDINGS ANTERIOR CIRCULATION: Examination severely degraded by motion artifact. Patent antegrade flow seen within the distal cervical segments of the internal carotid arteries bilaterally. ICAs are patent to the termini without obvious flow-limiting stenosis. A1 segments grossly patent. Anterior communicating artery not well assessed. Anterior cerebral arteries not well assessed due to motion. M1 segments grossly patent bilaterally. MCA branches grossly perfused bilaterally and fairly symmetric, although poorly assessed due to motion. POSTERIOR CIRCULATION: Vertebral arteries patent to the vertebrobasilar junction without obvious stenosis. Right vertebral artery dominant. Posterior inferior cerebral arteries not well seen. Basilar grossly patent to its distal aspect without obvious stenosis, although evaluation limited by motion. Superior cerebral arteries patent proximally. Both of the PCAs appear to be largely supplied via the basilar and are grossly perfused to their distal aspects. Evaluation for possible aneurysm fairly limited by motion. IMPRESSION: MRI HEAD IMPRESSION: 1. Moderately motion degraded exam. 2. No definite acute intracranial abnormality identified. 3. Advanced age-related cerebral atrophy with mild to moderate chronic microvascular ischemic disease. MRA HEAD IMPRESSION: 1. Severely limited exam due to extensive motion artifact. 2. Gross patency of the major arterial vasculature within the head. No obvious large vessel occlusion. Evaluation for focal hemodynamically significant stenosis fairly limited on this exam. Electronically Signed   By: Rise MuBenjamin  McClintock M.D.   On:  07/12/2018 23:32   Dg Chest Port 1 View  Result Date: 07/12/2018 CLINICAL DATA:  Weakness and altered mental status EXAM: PORTABLE CHEST 1 VIEW COMPARISON:  08/29/2014 FINDINGS: The  patient's chin obscures the right lung apex. Atherosclerotic calcification of the aortic arch. Heart size within normal limits. The lungs appear clear. No blunting of the costophrenic angles. IMPRESSION: 1.  No active cardiopulmonary disease is radiographically apparent. 2.  Aortic Atherosclerosis (ICD10-I70.0). Electronically Signed   By: Gaylyn Rong M.D.   On: 07/12/2018 18:44   Ct Head Code Stroke Wo Contrast  Result Date: 07/12/2018 CLINICAL DATA:  Code stroke.  Left neglect. EXAM: CT HEAD WITHOUT CONTRAST TECHNIQUE: Contiguous axial images were obtained from the base of the skull through the vertex without intravenous contrast. COMPARISON:  Head CT 08/27/2014 and MRI 08/29/2014 FINDINGS: Brain: There is no evidence of acute infarct, intracranial hemorrhage, mass, midline shift, or extra-axial fluid collection. Cerebral atrophy is again noted including prominent mesial temporal lobe atrophy. Patchy cerebral white matter hypodensities may have mildly progressed and are nonspecific but compatible with mild-to-moderate chronic small vessel ischemic disease. Vascular: Calcified atherosclerosis at the skull base. No hyperdense vessel. Skull: No fracture or focal osseous lesion. Sinuses/Orbits: Minimal mucosal thickening partially visualized in the right maxillary sinus. Clear mastoid air cells. Bilateral cataract extraction. Other: None. ASPECTS [facility] Stroke Program Early CT Score) - Ganglionic level infarction (caudate, lentiform nuclei, internal capsule, insula, M1-M3 cortex): 7 - Supraganglionic infarction (M4-M6 cortex): 3 Total score (0-10 with 10 being normal): 10 IMPRESSION: 1. No evidence of acute intracranial abnormality. 2. ASPECTS is 10. 3. Chronic small vessel ischemic disease and cerebral atrophy. These results were communicated to Dr. Otelia Limes at 5:39 pm on 07/12/2018 by text page via the [facility] messaging system. Electronically Signed   By: Sebastian Ache M.D.   On: 07/12/2018 17:40         Scheduled Meds:  aspirin  81 mg Oral Daily   enoxaparin (LOVENOX) injection  30 mg Subcutaneous Q24H   glucagon (human recombinant)       insulin aspart  0-5 Units Subcutaneous QHS   insulin aspart  0-9 Units Subcutaneous TID WC   insulin glargine  12 Units Subcutaneous Q2200   levothyroxine  25 mcg Oral Q0600   QUEtiapine  25 mg Oral QHS   Continuous Infusions:  sodium chloride 75 mL/hr at 07/13/18 0500     LOS: 0 days    Time spent: 42 MINUTES    Kathlen Mody, MD [facility] Pager [phone]  If 7PM-7AM, please contact night-coverage [email or web address] Password [email or web address] 07/13/2018, 1:48 PM

## 2018-07-13 NOTE — Progress Notes (Addendum)
S/O:  Patient in bed, NAD. Eyes closed. Easily arouses to name calling. Per nursing has had some low blood sugars this morning, but is at 120 at this time.  BP (!) 100/46 (BP Location: Left Arm)   Pulse 84   Temp 98.6 F (37 C) (Oral)   Resp 15   Ht 5\' 6"  (1.676 m)   Wt 38.4 kg   SpO2 99%   BMI 13.66 kg/m   Physical Exam  HEENT-  [place]/AT.  Lungs- Respirations unlabored, 02 sats > 93% on RA Extremities- No edema. Diffusely decreased muscle bulk x 4  Neurological Examination Mental Status: Can not tell me name/age/date/month. Not following any commands. No spontaneous movements. Will open eyes briefly to sternal rub. Able to localize and withdraw BLE to noxious. With noxious to BUE, will say "ow" and wiggle, but did not withdraw appropriately.  Cranial Nerves: II: PERRL. No blink to threat bilaterally  III,IV, VI: Unable to fully test EOM, as patient squeezes eyes shut.  Eyes did cross midline. V,VII: Grimace to noxious is symmetric.  VIII: No response to auditory stimuli IX,X: Does not open mouth for evaluation XI: No gross asymmetry XII: Does not protrude tongue to command Motor/Sensory: BUE with flexion contractures noted. Grimaces and moans to BUE noxious stimuli but did not withdraw. BLE able to withdraw to noxious. No spontaneous movements noted.   Cerebellar/Gait: Unable to assess MRI/MRA head results reviewed:  MRI HEAD IMPRESSION: 1. Moderately motion degraded exam. 2. No definite acute intracranial abnormality identified. 3. Advanced age-related cerebral atrophy with mild to moderate chronic microvascular ischemic disease.  MRA HEAD IMPRESSION: 1. Severely limited exam due to extensive motion artifact. 2. Gross patency of the major arterial vasculature within the head. No obvious large vessel occlusion. Evaluation for focal hemodynamically significant stenosis fairly limited on this exam.  EEG preliminary review: Fast beta activity on a background of relatively  low voltage with no electrographic seizures seen.   EEG official report: This is an abnormal EEG secondary to general background slowing.  This finding may be seen with a diffuse disturbance that is etiologically nonspecific, but may include a metabolic encephalopathy, among other possibilities.  No epileptiform activity was noted.    TSH came back markedly elevated at 12.2, consistent with hypothyroidism.   A/R: 85 year old female presenting with obtundation 1. TSH consistent with severe hypothyroidism. 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 2. MRI brain shows no acute stroke. 3. EEG shows no seizure.  4. Consider increasing current dose of levothyroxine to 60 mcg/day, based on the formula per the literature of 1.6 mcg/kg/day PO. Call pharmacy for assistance in converting to IV which may be the best route of administration.  5. Consider administration of hydrocortisone, which in cases of myxedema coma should be administered until coexisting adrenal insufficiency is ruled out. 6. Consider Endocrinology consult.  7. Of note, per the literature on myxedema coma: "A common misconception is that a patient must be comatose to be diagnosed with myxedema coma. However, myxedema coma is a misnomer because most patients exhibit neither the nonpitting edema known as myxedema nor coma. Instead, the cardinal manifestation of myxedema coma is a deterioration of the patient's mental status."   Valentina Lucks, MSN, NP-C [facility] [phone]  Electronically signed: Dr. Caryl Pina

## 2018-07-14 DIAGNOSIS — R1311 Dysphagia, oral phase: Secondary | ICD-10-CM | POA: Diagnosis present

## 2018-07-14 DIAGNOSIS — B9620 Unspecified Escherichia coli [E. coli] as the cause of diseases classified elsewhere: Secondary | ICD-10-CM | POA: Diagnosis present

## 2018-07-14 DIAGNOSIS — G9341 Metabolic encephalopathy: Secondary | ICD-10-CM | POA: Diagnosis present

## 2018-07-14 DIAGNOSIS — Z809 Family history of malignant neoplasm, unspecified: Secondary | ICD-10-CM | POA: Diagnosis not present

## 2018-07-14 DIAGNOSIS — D509 Iron deficiency anemia, unspecified: Secondary | ICD-10-CM | POA: Diagnosis present

## 2018-07-14 DIAGNOSIS — N390 Urinary tract infection, site not specified: Secondary | ICD-10-CM | POA: Diagnosis present

## 2018-07-14 DIAGNOSIS — R4182 Altered mental status, unspecified: Secondary | ICD-10-CM | POA: Diagnosis present

## 2018-07-14 DIAGNOSIS — E035 Myxedema coma: Secondary | ICD-10-CM | POA: Diagnosis present

## 2018-07-14 DIAGNOSIS — F0281 Dementia in other diseases classified elsewhere with behavioral disturbance: Secondary | ICD-10-CM | POA: Diagnosis present

## 2018-07-14 DIAGNOSIS — E860 Dehydration: Secondary | ICD-10-CM | POA: Diagnosis present

## 2018-07-14 DIAGNOSIS — D638 Anemia in other chronic diseases classified elsewhere: Secondary | ICD-10-CM | POA: Diagnosis present

## 2018-07-14 DIAGNOSIS — Z833 Family history of diabetes mellitus: Secondary | ICD-10-CM | POA: Diagnosis not present

## 2018-07-14 DIAGNOSIS — E119 Type 2 diabetes mellitus without complications: Secondary | ICD-10-CM | POA: Diagnosis not present

## 2018-07-14 DIAGNOSIS — Z9842 Cataract extraction status, left eye: Secondary | ICD-10-CM | POA: Diagnosis not present

## 2018-07-14 DIAGNOSIS — F0390 Unspecified dementia without behavioral disturbance: Secondary | ICD-10-CM | POA: Diagnosis not present

## 2018-07-14 DIAGNOSIS — Z90710 Acquired absence of both cervix and uterus: Secondary | ICD-10-CM | POA: Diagnosis not present

## 2018-07-14 DIAGNOSIS — Z79899 Other long term (current) drug therapy: Secondary | ICD-10-CM | POA: Diagnosis not present

## 2018-07-14 DIAGNOSIS — E039 Hypothyroidism, unspecified: Secondary | ICD-10-CM | POA: Diagnosis not present

## 2018-07-14 DIAGNOSIS — E7800 Pure hypercholesterolemia, unspecified: Secondary | ICD-10-CM | POA: Diagnosis present

## 2018-07-14 DIAGNOSIS — Z794 Long term (current) use of insulin: Secondary | ICD-10-CM | POA: Diagnosis not present

## 2018-07-14 DIAGNOSIS — E785 Hyperlipidemia, unspecified: Secondary | ICD-10-CM | POA: Diagnosis present

## 2018-07-14 DIAGNOSIS — I10 Essential (primary) hypertension: Secondary | ICD-10-CM | POA: Diagnosis present

## 2018-07-14 DIAGNOSIS — Z9841 Cataract extraction status, right eye: Secondary | ICD-10-CM | POA: Diagnosis not present

## 2018-07-14 DIAGNOSIS — Z7982 Long term (current) use of aspirin: Secondary | ICD-10-CM | POA: Diagnosis not present

## 2018-07-14 DIAGNOSIS — E11649 Type 2 diabetes mellitus with hypoglycemia without coma: Secondary | ICD-10-CM | POA: Diagnosis present

## 2018-07-14 LAB — CBC
HCT: 30.6 % — ABNORMAL LOW (ref 36.0–46.0)
Hemoglobin: 9.9 g/dL — ABNORMAL LOW (ref 12.0–15.0)
MCH: 31.6 pg (ref 26.0–34.0)
MCHC: 32.4 g/dL (ref 30.0–36.0)
MCV: 97.8 fL (ref 80.0–100.0)
Platelets: 139 10*3/uL — ABNORMAL LOW (ref 150–400)
RBC: 3.13 MIL/uL — ABNORMAL LOW (ref 3.87–5.11)
RDW: 13.2 % (ref 11.5–15.5)
WBC: 4 10*3/uL (ref 4.0–10.5)
nRBC: 0 % (ref 0.0–0.2)

## 2018-07-14 LAB — BASIC METABOLIC PANEL
Anion gap: 11 (ref 5–15)
BUN: 10 mg/dL (ref 8–23)
CO2: 17 mmol/L — ABNORMAL LOW (ref 22–32)
Calcium: 9.1 mg/dL (ref 8.9–10.3)
Chloride: 112 mmol/L — ABNORMAL HIGH (ref 98–111)
Creatinine, Ser: 0.97 mg/dL (ref 0.44–1.00)
GFR calc Af Amer: 58 mL/min — ABNORMAL LOW (ref >60)
GFR calc non Af Amer: 50 mL/min — ABNORMAL LOW (ref >60)
Glucose, Bld: 132 mg/dL — ABNORMAL HIGH (ref 70–99)
Potassium: 4.2 mmol/L (ref 3.5–5.1)
Sodium: 140 mmol/L (ref 135–145)

## 2018-07-14 LAB — URINALYSIS, ROUTINE W REFLEX MICROSCOPIC
Bilirubin Urine: NEGATIVE
Glucose, UA: 150 mg/dL — AB
Ketones, ur: NEGATIVE mg/dL
Nitrite: POSITIVE — AB
Protein, ur: NEGATIVE mg/dL
Specific Gravity, Urine: 1.01 (ref 1.005–1.030)
pH: 5 (ref 5.0–8.0)

## 2018-07-14 LAB — GLUCOSE, CAPILLARY
Glucose-Capillary: 68 mg/dL — ABNORMAL LOW (ref 70–99)
Glucose-Capillary: 41 mg/dL — CL (ref 70–99)
Glucose-Capillary: 109 mg/dL — ABNORMAL HIGH (ref 70–99)
Glucose-Capillary: 117 mg/dL — ABNORMAL HIGH (ref 70–99)
Glucose-Capillary: 171 mg/dL — ABNORMAL HIGH (ref 70–99)
Glucose-Capillary: 298 mg/dL — ABNORMAL HIGH (ref 70–99)

## 2018-07-14 MED ORDER — QUETIAPINE FUMARATE 25 MG PO TABS
12.5000 mg | ORAL_TABLET | Freq: Every day | ORAL | Status: DC
  Administered 2018-07-14 – 2018-07-16 (×3): 12.5 mg via ORAL
  Filled 2018-07-14 (×3): qty 1

## 2018-07-14 MED ORDER — LEVOTHYROXINE SODIUM 100 MCG/5ML IV SOLN
50.0000 ug | Freq: Once | INTRAVENOUS | Status: AC
  Administered 2018-07-14: 50 ug via INTRAVENOUS
  Filled 2018-07-14: qty 5

## 2018-07-14 MED ORDER — HYDROCORTISONE NA SUCCINATE PF 100 MG IJ SOLR
100.0000 mg | Freq: Three times a day (TID) | INTRAMUSCULAR | Status: DC
  Administered 2018-07-14 – 2018-07-15 (×4): 100 mg via INTRAVENOUS
  Filled 2018-07-14 (×4): qty 2

## 2018-07-14 MED ORDER — LEVOTHYROXINE SODIUM 100 MCG/5ML IV SOLN
50.0000 ug | Freq: Every day | INTRAVENOUS | Status: DC
  Administered 2018-07-15: 50 ug via INTRAVENOUS
  Filled 2018-07-14 (×2): qty 5

## 2018-07-14 NOTE — Progress Notes (Signed)
Hypoglycemic Event  CBG: 41  Treatment: 16oz of fruit juice  Follow-up CBG Result: 109  MD notified    Mayer Vondrak

## 2018-07-14 NOTE — Evaluation (Signed)
Physical Therapy Evaluation Patient Details Name: Marcia Campbell MRN: [id number] DOB: Jul 30, 1921 Today's Date: 07/14/2018   History of Present Illness  Marcia Campbell is a 85 y.o. female with medical history significant of dementia, diabetes, hyperlipidemia, hypertension and anemia of chronic disease who was brought in by family is due to her sudden onset of confusion today.    Clinical Impression  Pt near functional baseline. Per daughter as long as patient cant tolerate std pvt transfer she can continue to care for her mother. Pt was able to sit EOB and transfer to chair with assist. Pt remained non-verbal. Discussed diet change as patient having difficulty with bacon and english muffin. Per daughter pt used to mechanical soft diet. RN made aware. Acute PT to follow.    Follow Up Recommendations No PT follow up;Supervision/Assistance - 24 hour(pt at baseline mobility)    Equipment Recommendations  None recommended by PT    Recommendations for Other Services       Precautions / Restrictions Precautions Precautions: Fall Precaution Comments: dementia Restrictions Weight Bearing Restrictions: No      Mobility  Bed Mobility Overal bed mobility: Needs Assistance Bed Mobility: Supine to Sit     Supine to sit: Total assist;+2 for physical assistance     General bed mobility comments: pt with no initiation of task asked, dependent for trunk elevation and bilat LEs  Transfers Overall transfer level: Needs assistance Equipment used: (2 person lift with gait belt) Transfers: Stand Pivot Transfers   Stand pivot transfers: Max assist;+2 physical assistance       General transfer comment: pt didt attempt to move LEs out of response to weight shifting  Ambulation/Gait             General Gait Details: non-amublatory  Stairs            Wheelchair Mobility    Modified Rankin (Stroke Patients Only)       Balance Overall balance assessment: Needs  assistance Sitting-balance support: Feet supported;No upper extremity supported Sitting balance-Leahy Scale: Fair Sitting balance - Comments: pt sat EOB with close min guard   Standing balance support: No upper extremity supported;During functional activity Standing balance-Leahy Scale: Poor Standing balance comment: dependent on physical assist                             Pertinent Vitals/Pain Pain Assessment: Faces Faces Pain Scale: Hurts even more Pain Location: with movement of L hand Pain Descriptors / Indicators: Grimacing    Home Living Family/patient expects to be discharged to:: Private residence Living Arrangements: Children Available Help at Discharge: Family;Available 24 hours/day Type of Home: House Home Access: Ramped entrance     Home Layout: One level Home Equipment: Wheelchair - [employer] - 4 wheels;Bedside commode Additional Comments: per daughter pt minimally verbal and dependent for most transfers and ADLs, was going to adult daycare when it's open    Prior Function Level of Independence: Needs assistance   Gait / Transfers Assistance Needed: w/c as primary mode of mobility  ADL's / Homemaking Assistance Needed: dependent on family        Hand Dominance   Dominant Hand: Right    Extremity/Trunk Assessment   Upper Extremity Assessment Upper Extremity Assessment: Defer to OT evaluation(holds arms in flexed position)    Lower Extremity Assessment Lower Extremity Assessment: RLE deficits/detail;LLE deficits/detail RLE Deficits / Details: no active movement, holding in fetal position LLE Deficits / Details: no  acitive movement, holding in fetal position    Cervical / Trunk Assessment Cervical / Trunk Assessment: Kyphotic  Communication   Communication: (per dtr pt used to mechanical soft diet)  Cognition Arousal/Alertness: Awake/alert Behavior During Therapy: Flat affect Overall Cognitive Status: History of cognitive  impairments - at baseline                                 General Comments: pt with dementia, non-verbal      General Comments General comments (skin integrity, edema, etc.): vss, pt continuing to chew bacon and then coughed it out, per daughter pt used to mechanical soft diet, RN alerted    Exercises     Assessment/Plan    PT Assessment Patient needs continued PT services  PT Problem List Decreased strength;Decreased activity tolerance;Decreased knowledge of use of DME       PT Treatment Interventions DME instruction;Gait training;Functional mobility training;Therapeutic activities;Therapeutic exercise;Balance training;Neuromuscular re-education;Cognitive remediation    PT Goals (Current goals can be found in the Care Plan section)  Acute Rehab PT Goals Patient Stated Goal: didn't state PT Goal Formulation: Patient unable to participate in goal setting Time For Goal Achievement: 07/28/18 Potential to Achieve Goals: Fair    Frequency Min 2X/week   Barriers to discharge        Co-evaluation PT/OT/SLP Co-Evaluation/Treatment: Yes Reason for Co-Treatment: Complexity of the patient's impairments (multi-system involvement) PT goals addressed during session: Mobility/safety with mobility         AM-PAC PT "6 Clicks" Mobility  Outcome Measure Help needed turning from your back to your side while in a flat bed without using bedrails?: Total Help needed moving from lying on your back to sitting on the side of a flat bed without using bedrails?: Total Help needed moving to and from a bed to a chair (including a wheelchair)?: A Lot Help needed standing up from a chair using your arms (e.g., wheelchair or bedside chair)?: A Lot Help needed to walk in hospital room?: Total Help needed climbing 3-5 steps with a railing? : Total 6 Click Score: 8    End of Session Equipment Utilized During Treatment: Gait belt Activity Tolerance: Patient tolerated treatment  well Patient left: in chair;with call bell/phone within reach;with chair alarm set Nurse Communication: Mobility status PT Visit Diagnosis: Unsteadiness on feet (R26.81);Muscle weakness (generalized) (M62.81)    Time: [id number] PT Time Calculation (min) (ACUTE ONLY): 22 min   Charges:   PT Evaluation $PT Eval Moderate Complexity: 1 Mod          Lewis ShockAshly Mary-Anne Campbell, PT, DPT Acute Rehabilitation Services Pager #: [phone] Office #: [phone]   Marcia Campbell 07/14/2018, 9:54 AM

## 2018-07-14 NOTE — Progress Notes (Signed)
PROGRESS NOTE    Marcia Campbell  NWG:[id number] DOB: 16-Jan-1922 DOA: 07/12/2018 PCP: Marcia Cables, MD    Brief Narrative:  Marcia Campbell is a 85 y.o. female with medical history significant of dementia, diabetes, hyperlipidemia, hypertension and anemia of chronic disease who was brought in by family is due to her sudden onset of confusion.Patient was at her baseline which is dementia was talking and laughing watching TV.  MRI and MRA of the head shows MRI HEAD IMPRESSION:  Moderately motion degraded exam. No definite acute intracranial abnormality identified.  Advanced age-related cerebral atrophy with mild to moderate chronic microvascular ischemic disease.  Severely limited exam due to extensive motion artifact.  Gross patency of the major arterial vasculature within the head. No obvious large vessel occlusion. Evaluation for focal hemodynamically significant stenosis fairly limited on this exam.    Assessment & Plan:   Principal Problem:   AMS (altered mental status) Active Problems:   DM (diabetes mellitus) (HCC)   Hyperlipemia   Anemia, iron deficiency   Moderate dementia with behavioral disturbance (HCC)   Hypothyroidism   Acute metabolic encephalopathy:  Much improved. She is alert , but still not following commands.  MRI brain without contrast did not show any acute stroke.  EEG does not show any epileptiform activity.  Differential include mild AKI , vs myxedema coma due to her low free f4 and elevated TSH.  Started her on IV synthroid 50 mcg IV daily, .  Started her on IV Hydrocortisone 100 mg every 8 hours.  Repeat UA and urine cultures. CXR neg for pneumonia.    Mild oral dysphagia: No sings of aspiration.  SLP eval recommending dysphagia 3 diet.    Anemia of chronic disease:  Vit b12 low normal.  Anemia panel ordered.    Diabetes mellitus: uncontrolled with hypoglycemia. Stopped the lantus and started her on IV hydrocortisone.  CBG  (last 3)  Recent Labs    07/14/18 0912 07/14/18 1131 07/14/18 1604  GLUCAP 109* 117* 171*   Continue with SSI.     Hyperlipidemia:    Hypothyroidism:  Resume synthroid.    DVT prophylaxis: lovenox.  Code Status: full code.  Family Communication:  None at bedside Disposition Plan: pending PT/ot EVAL.  Consultants:   Neurology.   Procedures:EEG   Antimicrobials:none.    Subjective: Lethargic,  not following commands.   Objective: Vitals:   07/14/18 0724 07/14/18 1134 07/14/18 1234 07/14/18 1606  BP: (!) 142/91 (!) 125/110 127/76 (!) 157/77  Pulse: 69 74  82  Resp: Temp: 97.7 F (36.5 C) 98.1 F (36.7 C)  97.6 F (36.4 C)  TempSrc: Oral Oral  Oral  SpO2: 100% 99%  100%  Weight:      Height:        Intake/Output Summary (Last 24 hours) at 07/14/2018 1751 Last data filed at 07/14/2018 0600 Gross per 24 hour  Intake 450 ml  Output 1400 ml  Net -950 ml   Filed Weights   07/12/18 2347  Weight: 38.4 kg    Examination:  General exam: lethargic, not following commands. Not in distress.  Respiratory system: Clear to auscultation. Respiratory effort normal. Cardiovascular system: S1 & S2 heard, RRR.  No pedal edema. Gastrointestinal system: Abdomen is nondistended, soft and nontender.  Normal bowel sounds heard. Central nervous system: lethargic, BUT NOT FOLLOWING Commands. Able to move lower extremities. Upper extremities rigid.  Extremities: no pedal edema.  Skin: No rashes, lesions or ulcers  Psychiatry: .cannot be assessed.     Data Reviewed: I have personally reviewed following labs and imaging studies  CBC: Recent Labs  Lab 07/12/18 1711 07/13/18 0036 07/14/18 1016  WBC 5.4 8.3 4.0  NEUTROABS 2.8  --   --   HGB 10.5* 10.0* 9.9*  HCT 32.9* 30.8* 30.6*  MCV 97.3 95.4 97.8  PLT 264 255 139*   Basic Metabolic Panel: Recent Labs  Lab 07/12/18 1711 07/12/18 1718 07/13/18 0036 07/14/18 1016  NA 143  --  140 140  K 3.8  --   4.1 4.2  CL 111  --  111 112*  CO2 18*  --  18* 17*  GLUCOSE 148*  --  151* 132*  BUN 18  --  18 10  CREATININE 1.14* 1.00 1.01* 0.97  CALCIUM 9.1  --  9.2 9.1   GFR: Estimated Creatinine Clearance: 21.5 mL/min (by C-G formula based on SCr of 0.97 mg/dL). Liver Function Tests: Recent Labs  Lab 07/12/18 1711 07/13/18 0036  AST 16 18  ALT 10 11  ALKPHOS 48 51  BILITOT 0.6 0.5  PROT 6.4* 6.3*  ALBUMIN 3.3* 3.4*   No results for input(s): LIPASE, AMYLASE in the last 168 hours. Recent Labs  Lab 07/12/18 2003  AMMONIA 16   Coagulation Profile: Recent Labs  Lab 07/12/18 1711  INR 1.1   Cardiac Enzymes: Recent Labs  Lab 07/12/18 1711  TROPONINI <0.03   BNP (last 3 results) No results for input(s): PROBNP in the last 8760 hours. HbA1C: Recent Labs    07/13/18 0036  HGBA1C 6.1*   CBG: Recent Labs  Lab 07/14/18 0630 07/14/18 0824 07/14/18 0912 07/14/18 1131 07/14/18 1604  GLUCAP 68* 41* 109* 117* 171*   Lipid Profile: No results for input(s): CHOL, HDL, LDLCALC, TRIG, CHOLHDL, LDLDIRECT in the last 72 hours. Thyroid Function Tests: Recent Labs    07/12/18 2003 07/12/18 2116  TSH 12.208*  --   FREET4  --  0.72*   Anemia Panel: Recent Labs    07/12/18 2003  VITAMINB12 299   Sepsis Labs: No results for input(s): PROCALCITON, LATICACIDVEN in the last 168 hours.  No results found for this or any previous visit (from the past 240 hour(s)).       Radiology Studies: Mr Shirlee Latch TM Contrast  Result Date: 07/12/2018 CLINICAL DATA:  Initial evaluation for acute altered mental status, unresponsiveness. EXAM: MRI HEAD WITHOUT CONTRAST MRA HEAD WITHOUT CONTRAST TECHNIQUE: Multiplanar, multiecho pulse sequences of the brain and surrounding structures were obtained without intravenous contrast. Angiographic images of the head were obtained using MRA technique without contrast. COMPARISON:  Prior CT from earlier the same day. FINDINGS: MRI HEAD FINDINGS Brain:  Examination moderately degraded by motion artifact. Diffuse prominence of the CSF containing spaces compatible with generalized cerebral atrophy, moderately advanced in nature. Mild to moderate chronic microvascular ischemic changes present within the periventricular and deep white matter both cerebral hemispheres. No abnormal foci of restricted diffusion to suggest acute or subacute ischemia. Gray-white matter differentiation maintained. No encephalomalacia to suggest chronic cortical infarction. No foci of susceptibility artifact to suggest acute or chronic intracranial hemorrhage. No mass lesion, midline shift or mass effect. Diffuse ventricular prominence related global parenchymal volume loss without hydrocephalus. No extra-axial fluid collection. Vascular: Major intracranial vascular flow voids grossly maintained. Skull and upper cervical spine: Craniocervical junction within normal limits. No focal marrow replacing lesion. No scalp soft tissue abnormality. Sinuses/Orbits: Patient status post bilateral ocular lens replacement. Mild scattered mucosal thickening  within the ethmoidal air cells. Paranasal sinuses are otherwise clear. No mastoid effusion. Inner ear structures normal. Other: None. MRA HEAD FINDINGS ANTERIOR CIRCULATION: Examination severely degraded by motion artifact. Patent antegrade flow seen within the distal cervical segments of the internal carotid arteries bilaterally. ICAs are patent to the termini without obvious flow-limiting stenosis. A1 segments grossly patent. Anterior communicating artery not well assessed. Anterior cerebral arteries not well assessed due to motion. M1 segments grossly patent bilaterally. MCA branches grossly perfused bilaterally and fairly symmetric, although poorly assessed due to motion. POSTERIOR CIRCULATION: Vertebral arteries patent to the vertebrobasilar junction without obvious stenosis. Right vertebral artery dominant. Posterior inferior cerebral arteries not  well seen. Basilar grossly patent to its distal aspect without obvious stenosis, although evaluation limited by motion. Superior cerebral arteries patent proximally. Both of the PCAs appear to be largely supplied via the basilar and are grossly perfused to their distal aspects. Evaluation for possible aneurysm fairly limited by motion. IMPRESSION: MRI HEAD IMPRESSION: 1. Moderately motion degraded exam. 2. No definite acute intracranial abnormality identified. 3. Advanced age-related cerebral atrophy with mild to moderate chronic microvascular ischemic disease. MRA HEAD IMPRESSION: 1. Severely limited exam due to extensive motion artifact. 2. Gross patency of the major arterial vasculature within the head. No obvious large vessel occlusion. Evaluation for focal hemodynamically significant stenosis fairly limited on this exam. Electronically Signed   By: Rise MuBenjamin  McClintock M.D.   On: 07/12/2018 23:32   Mr Brain Wo Contrast  Result Date: 07/12/2018 CLINICAL DATA:  Initial evaluation for acute altered mental status, unresponsiveness. EXAM: MRI HEAD WITHOUT CONTRAST MRA HEAD WITHOUT CONTRAST TECHNIQUE: Multiplanar, multiecho pulse sequences of the brain and surrounding structures were obtained without intravenous contrast. Angiographic images of the head were obtained using MRA technique without contrast. COMPARISON:  Prior CT from earlier the same day. FINDINGS: MRI HEAD FINDINGS Brain: Examination moderately degraded by motion artifact. Diffuse prominence of the CSF containing spaces compatible with generalized cerebral atrophy, moderately advanced in nature. Mild to moderate chronic microvascular ischemic changes present within the periventricular and deep white matter both cerebral hemispheres. No abnormal foci of restricted diffusion to suggest acute or subacute ischemia. Gray-white matter differentiation maintained. No encephalomalacia to suggest chronic cortical infarction. No foci of susceptibility artifact  to suggest acute or chronic intracranial hemorrhage. No mass lesion, midline shift or mass effect. Diffuse ventricular prominence related global parenchymal volume loss without hydrocephalus. No extra-axial fluid collection. Vascular: Major intracranial vascular flow voids grossly maintained. Skull and upper cervical spine: Craniocervical junction within normal limits. No focal marrow replacing lesion. No scalp soft tissue abnormality. Sinuses/Orbits: Patient status post bilateral ocular lens replacement. Mild scattered mucosal thickening within the ethmoidal air cells. Paranasal sinuses are otherwise clear. No mastoid effusion. Inner ear structures normal. Other: None. MRA HEAD FINDINGS ANTERIOR CIRCULATION: Examination severely degraded by motion artifact. Patent antegrade flow seen within the distal cervical segments of the internal carotid arteries bilaterally. ICAs are patent to the termini without obvious flow-limiting stenosis. A1 segments grossly patent. Anterior communicating artery not well assessed. Anterior cerebral arteries not well assessed due to motion. M1 segments grossly patent bilaterally. MCA branches grossly perfused bilaterally and fairly symmetric, although poorly assessed due to motion. POSTERIOR CIRCULATION: Vertebral arteries patent to the vertebrobasilar junction without obvious stenosis. Right vertebral artery dominant. Posterior inferior cerebral arteries not well seen. Basilar grossly patent to its distal aspect without obvious stenosis, although evaluation limited by motion. Superior cerebral arteries patent proximally. Both of the PCAs appear to be  largely supplied via the basilar and are grossly perfused to their distal aspects. Evaluation for possible aneurysm fairly limited by motion. IMPRESSION: MRI HEAD IMPRESSION: 1. Moderately motion degraded exam. 2. No definite acute intracranial abnormality identified. 3. Advanced age-related cerebral atrophy with mild to moderate chronic  microvascular ischemic disease. MRA HEAD IMPRESSION: 1. Severely limited exam due to extensive motion artifact. 2. Gross patency of the major arterial vasculature within the head. No obvious large vessel occlusion. Evaluation for focal hemodynamically significant stenosis fairly limited on this exam. Electronically Signed   By: Rise Mu M.D.   On: 07/12/2018 23:32   Dg Chest Port 1 View  Result Date: 07/12/2018 CLINICAL DATA:  Weakness and altered mental status EXAM: PORTABLE CHEST 1 VIEW COMPARISON:  08/29/2014 FINDINGS: The patient's chin obscures the right lung apex. Atherosclerotic calcification of the aortic arch. Heart size within normal limits. The lungs appear clear. No blunting of the costophrenic angles. IMPRESSION: 1.  No active cardiopulmonary disease is radiographically apparent. 2.  Aortic Atherosclerosis (ICD10-I70.0). Electronically Signed   By: Gaylyn Rong M.D.   On: 07/12/2018 18:44        Scheduled Meds:  aspirin  81 mg Oral Daily   enoxaparin (LOVENOX) injection  30 mg Subcutaneous Q24H   hydrocortisone sod succinate (SOLU-CORTEF) inj  100 mg Intravenous Q8H   insulin aspart  0-5 Units Subcutaneous QHS   insulin aspart  0-9 Units Subcutaneous TID WC   QUEtiapine  25 mg Oral QHS   Continuous Infusions:    LOS: 0 days    Time spent: 15 MINUTES    Kathlen Mody, MD [facility] Pager [phone]  If 7PM-7AM, please contact night-coverage [email or web address] Password [facility] 07/14/2018, 5:51 PM

## 2018-07-14 NOTE — Evaluation (Signed)
Occupational Therapy Evaluation Patient Details Name: Marcia RoysClara R Delk MRN: [id number] DOB: 1922-02-03 Today's Date: 07/14/2018    History of Present Illness Gwendloyn Hazel SamsR Birnbaum is a 85 y.o. female with medical history significant of dementia, diabetes, hyperlipidemia, hypertension and anemia of chronic disease who was brought in by family is due to her sudden onset of confusion today.     Clinical Impression   Pt PTA: per phone call with daughter, pt was 1 person transfer hand held assist for any ambulation ( minimal) and pt spontaneous with speech. Pt nearly totalA for ADL care and was at adult day program M-F before COVID. Pt currently maxA+2 for transfer and sitting eob x5 mins and not initiating movements with BUEs for any ADL- remaining TotalA. Pt with rigidity in BUEs and would benefit from light stretching. Pt would benefit from continued OT skilled services for ADL, mobility and HEP. OT to follow acutely.       Follow Up Recommendations  No OT follow up;Supervision/Assistance - 24 hour    Equipment Recommendations  None recommended by OT    Recommendations for Other Services       Precautions / Restrictions Precautions Precautions: Fall Precaution Comments: dementia Restrictions Weight Bearing Restrictions: No      Mobility Bed Mobility Overal bed mobility: Needs Assistance Bed Mobility: Supine to Sit     Supine to sit: Total assist;+2 for physical assistance     General bed mobility comments: pt with no initiation of task asked, dependent for trunk elevation and bilat LEs  Transfers Overall transfer level: Needs assistance Equipment used: (2 person lift with gait belt) Transfers: Stand Pivot Transfers   Stand pivot transfers: Max assist;+2 physical assistance       General transfer comment: pt didt attempt to move LEs out of response to weight shifting    Balance Overall balance assessment: Needs assistance Sitting-balance support: Feet supported;No  upper extremity supported Sitting balance-Leahy Scale: Fair Sitting balance - Comments: pt sat EOB with close min guard   Standing balance support: No upper extremity supported;During functional activity Standing balance-Leahy Scale: Poor Standing balance comment: dependent on physical assist                           ADL either performed or assessed with clinical judgement   ADL Overall ADL's : At baseline                                       General ADL Comments: Per daughter on phone with OT, pt requires nearly totalA for all ADL; wears depends at [facility] and it is too dirty to clean; pt requires totalA for bath in bed and transfers with 1 assist. sedentary lifestyle.     Vision Baseline Vision/History: No visual deficits Vision Assessment?: No apparent visual deficits     Perception     Praxis      Pertinent Vitals/Pain Pain Assessment: Faces Faces Pain Scale: Hurts even more Pain Location: with movement of L hand Pain Descriptors / Indicators: Grimacing     Hand Dominance Right   Extremity/Trunk Assessment Upper Extremity Assessment Upper Extremity Assessment: Generalized weakness;RUE deficits/detail;LUE deficits/detail RUE Deficits / Details: minimal active movement, rigidity with extension of elbow and flexion of shoulder; does not use very often RUE: Unable to fully assess due to pain RUE Coordination: decreased fine  motor;decreased gross motor LUE Deficits / Details: nearly contracted L elbow flexion and R shoulder ROM >50* no AROM of hand in flexed position at wrist through digits; pain with movement of L hand LUE: Unable to fully assess due to pain LUE Coordination: decreased fine motor;decreased gross motor   Lower Extremity Assessment Lower Extremity Assessment: RLE deficits/detail;LLE deficits/detail RLE Deficits / Details: no active movement, holding in fetal position LLE Deficits / Details: no acitive  movement, holding in fetal position   Cervical / Trunk Assessment Cervical / Trunk Assessment: Kyphotic   Communication Communication Communication: Other (comment)(per dtr pt used to mechanical soft diet)   Cognition Arousal/Alertness: Awake/alert Behavior During Therapy: Flat affect Overall Cognitive Status: History of cognitive impairments - at baseline                                 General Comments: pt with dementia, non-verbal   General Comments  vss, pt continuing to chew bacon and then coughed it out, per daughter pt used to mechanical soft diet, RN alerted. Per phone call with daughter; pt used to go to adult day care M-F before COVID and rode in w/c at facility. Pt speaks to daughter if present, but confused with situation. DAughter reports that pt is agreeable at adult day program.    Exercises     Shoulder Instructions      Home Living Family/patient expects to be discharged to:: Private residence Living Arrangements: Children Available Help at Discharge: Family;Available 24 hours/day Type of Home: House Home Access: Ramped entrance     Home Layout: One level     Bathroom Shower/Tub: Tub/shower unit         Home Equipment: Wheelchair - [employer] - 4 wheels;Bedside commode   Additional Comments: per daughter pt minimally verbal and dependent for most transfers and ADLs, was going to adult daycare when it's open      Prior Functioning/Environment Level of Independence: Needs assistance  Gait / Transfers Assistance Needed: w/c as primary mode of mobility ADL's / Homemaking Assistance Needed: dependent on family Communication / Swallowing Assistance Needed: per daughter pt used to mechanical soft diet          OT Problem List: Decreased strength;Decreased activity tolerance;Impaired balance (sitting and/or standing);Decreased safety awareness;Impaired UE functional use;Pain      OT Treatment/Interventions: Self-care/ADL  training;Therapeutic exercise;Neuromuscular education;Energy conservation;Patient/family education;Balance training;Therapeutic activities    OT Goals(Current goals can be found in the care plan section) Acute Rehab OT Goals Patient Stated Goal: didn't state OT Goal Formulation: With patient/family Time For Goal Achievement: 07/28/18 Potential to Achieve Goals: Fair ADL Goals Pt Will Perform Eating: with max assist;with adaptive utensils Pt/caregiver will Perform Home Exercise Program: Both right and left upper extremity;With Supervision;With written HEP provided Additional ADL Goal #1: pt will sit EOB with balance challenges maintaing fair dynamic sitting balance  OT Frequency: Min 2X/week   Barriers to D/C:            Co-evaluation PT/OT/SLP Co-Evaluation/Treatment: Yes Reason for Co-Treatment: For patient/therapist safety;Complexity of the patient's impairments (multi-system involvement) PT goals addressed during session: Mobility/safety with mobility OT goals addressed during session: ADL's and self-care      AM-PAC OT "6 Clicks" Daily Activity     Outcome Measure Help from another person eating meals?: Total Help from another person taking care of personal grooming?: Total Help from another person toileting, which includes using toliet, bedpan, or urinal?:  Total Help from another person bathing (including washing, rinsing, drying)?: Total Help from another person to put on and taking off regular upper body clothing?: Total Help from another person to put on and taking off regular lower body clothing?: Total 6 Click Score: 6   End of Session Equipment Utilized During Treatment: Gait belt Nurse Communication: Mobility status;Other (comment)(mechanical soft diet and PT ordering SLP eval)  Activity Tolerance: Treatment limited secondary to medical complications (Comment);Patient tolerated treatment well Patient left: in chair;with call bell/phone within reach;with chair alarm  set  OT Visit Diagnosis: Unsteadiness on feet (R26.81);Muscle weakness (generalized) (M62.81)                Time: 3005-1102 OT Time Calculation (min): 45 min Charges:  OT General Charges $OT Visit: 1 Visit OT Evaluation $OT Eval Moderate Complexity: 1 Mod OT Treatments $Self Care/Home Management : 8-22 mins  Revonda Standard Cecil Cranker) Glendell Docker OTR/L Acute Rehabilitation Services Pager: [phone] Office: [phone]   Randilyn Foisy J Leotha Voeltz 07/14/2018, 10:15 AM

## 2018-07-14 NOTE — Progress Notes (Addendum)
S/O:  Patient is more awake than yesterday. Sitting up in chair. Opened eyes to name calling. Remained non-verbal, not following commands.kept eyes open longer today, than yesterday.  BP (!) 142/91 (BP Location: Right Arm)   Pulse 69   Temp 97.7 F (36.5 C) (Oral)   Resp 16   Ht 5\' 6"  (1.676 m)   Wt 38.4 kg   SpO2 100%   BMI 13.66 kg/m   Physical Exam  HEENT-  [place]/AT.  Lungs- Respirations unlabored, 02 sats > 93% on RA Extremities- No edema. Diffusely decreased muscle bulk x 4  Neurological Examination Mental Status: Can not tell me name/age/date/month. Not following any commands. No spontaneous movements. Will open eyes briefly to sternal rub. Able to localize and withdraw BLE to noxious.   No verbalizations noted today.  Cranial Nerves: II: PERRL. No blink to threat bilaterally  III,IV, VI: Unable to fully test EOEM, as patient squeezes eyes shut.  Eyes did cross midline. V,VII: Grimace to noxious is symmetric.  VIII: No response to auditory stimuli IX,X: opened mouth, and not to command, but does not stick out tongue XI: No gross asymmetry XII: Does not protrude tongue to command Motor/Sensory: BUE with flexion contractures noted. Grimaces  to BUE noxious stimuli but did not withdraw. BLE able to withdraw to noxious. No spontaneous movements noted.   Cerebellar/Gait: Unable to assess MRI/MRA head results reviewed:  MRI HEAD IMPRESSION: 1. Moderately motion degraded exam. 2. No definite acute intracranial abnormality identified. 3. Advanced age-related cerebral atrophy with mild to moderate chronic microvascular ischemic disease.  MRA HEAD IMPRESSION: 1. Severely limited exam due to extensive motion artifact. 2. Gross patency of the major arterial vasculature within the head. No obvious large vessel occlusion. Evaluation for focal hemodynamically significant stenosis fairly limited on this exam.  EEG preliminary review: Fast beta activity on a background of  relatively low voltage with no electrographic seizures seen.   EEG official report: This is an abnormal EEG secondary to general background slowing.  This finding may be seen with a diffuse disturbance that is etiologically nonspecific, but may include a metabolic encephalopathy, among other possibilities.  No epileptiform activity was noted.    TSH came back markedly elevated at 12.2, consistent with hypothyroidism.   A/R: 85 year old female presenting with obtundation 1. TSH consistent with severe hypothyroidism. 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 Levothyroxine was changed from 25 mcg per day to 50 mcg/day injection by primary team. Also has been started on hydrocortisone 100 mg injection q8h. Slightly improved on neurological exam today.  2. MRI brain showed no acute stroke. EEG showed no seizure.  3. Of note, per the literature on myxedema coma: "A common misconception is that a patient must be comatose to be diagnosed with myxedema coma. However, myxedema coma is a misnomer because most patients exhibit neither the nonpitting edema known as myxedema nor coma. Instead, the cardinal manifestation of myxedema coma is a deterioration of the patient's mental status." 4. Neurology will sign off. Please call if there are additional questions.  Valentina Lucks, MSN, NP-C [facility] [phone]   Electronically signed: Dr. Caryl Pina

## 2018-07-14 NOTE — Evaluation (Signed)
Clinical/Bedside Swallow Evaluation Patient Details  Name: Marcia Campbell MRN: [id number] Date of Birth: 03-Aug-1921  Today's Date: 07/14/2018 Time: SLP Start Time (ACUTE ONLY): 1140 SLP Stop Time (ACUTE ONLY): 1155 SLP Time Calculation (min) (ACUTE ONLY): 15 min  Past Medical History:  Past Medical History:  Diagnosis Date  . Anemia, unspecified 05/28/2013  . Dementia (HCC)   . Diabetes mellitus   . Hypercholesteremia    Past Surgical History:  Past Surgical History:  Procedure Laterality Date  . ABDOMINAL HYSTERECTOMY    . BOWEL RESECTION    . BREAST REDUCTION SURGERY     HPI:  85 y.o. female with medical history significant of dementia, diabetes, hyperlipidemia, hypertension and anemia of chronic disease who was brought in by family is due to her sudden onset of confusion. 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    Assessment / Plan / Recommendation Clinical Impression  Pt presents with a mild oral dysphagia. Pt upright in chair at bedside, and awakened with cues however unable to follow simple commands. RN reports pt with extended mastication with regular bacon at prior meal with pocketting and that family reports pt consumes more mechanical soft consistencies at baseline. No overt s/sx of aspiration were exhibited with any PO this date. Prolonged mastication was noted with solids. Recommend dysphagia 3 (mechanical soft) and thin liquids with medicines in puree. SLP to follow up for diet tolerance.  SLP Visit Diagnosis: Dysphagia, oral phase (R13.11)    Aspiration Risk  Mild aspiration risk    Diet Recommendation   Dysphagia 3 (mechanical soft) thin liquids   Medication Administration: Crushed with puree    Other  Recommendations Oral Care Recommendations: Oral care BID   Follow up Recommendations 24 hour supervision/assistance      Frequency and Duration min 1 x/week  1 week        Prognosis Prognosis for Safe Diet Advancement: Fair Barriers to Reach Goals: Cognitive deficits      Swallow Study   General HPI: 85 y.o. female with medical history significant of dementia, diabetes, hyperlipidemia, hypertension and anemia of chronic disease who was brought in by family is due to her sudden onset of confusion. 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  Type of Study: Bedside Swallow Evaluation Previous Swallow Assessment: none on file Diet Prior to this Study: Regular;Thin liquids Temperature Spikes Noted: No Respiratory Status: Room air History of Recent Intubation: No Behavior/Cognition: Alert;Doesn't follow directions;Requires cueing Oral Cavity Assessment: Within Functional Limits Oral Cavity - Dentition: Adequate natural dentition Vision: Impaired for self-feeding Self-Feeding Abilities: Needs assist Patient Positioning: Upright in chair Baseline Vocal Quality: (pt did not vocalize during interaction despite cues) Volitional Cough: Cognitively unable to elicit Volitional Swallow: Unable to elicit    Oral/Motor/Sensory Function Overall Oral Motor/Sensory Function: Generalized oral weakness   Ice Chips Ice chips: Not tested   Thin Liquid Thin Liquid: Impaired Presentation: Straw Oral Phase Functional Implications: Prolonged oral transit Pharyngeal  Phase Impairments: Suspected delayed Swallow;Multiple swallows    Nectar Thick Nectar Thick Liquid: Not tested   Honey Thick Honey Thick Liquid: Not tested   Puree Puree: Impaired Presentation: Spoon Oral Phase Impairments: Reduced labial seal Oral Phase Functional Implications: Prolonged oral transit Pharyngeal Phase Impairments: Multiple swallows   Solid     Solid: Impaired Presentation: Spoon Oral Phase Impairments: Reduced labial seal;Reduced lingual movement/coordination Oral Phase Functional Implications: Prolonged  oral  transit;Impaired mastication Pharyngeal Phase Impairments: Multiple swallows;Suspected delayed Swallow      Tequita Marrs E Ayeshia Coppin MA, CCC-SLP Acute Rehabilitation Services 07/14/2018,12:56 PM

## 2018-07-15 LAB — RETICULOCYTES
Immature Retic Fract: 10.7 % (ref 2.3–15.9)
RBC.: 3.04 MIL/uL — ABNORMAL LOW (ref 3.87–5.11)
Retic Count, Absolute: 42.9 10*3/uL (ref 19.0–186.0)
Retic Ct Pct: 1.4 % (ref 0.4–3.1)

## 2018-07-15 LAB — GLUCOSE, CAPILLARY
Glucose-Capillary: 191 mg/dL — ABNORMAL HIGH (ref 70–99)
Glucose-Capillary: 340 mg/dL — ABNORMAL HIGH (ref 70–99)
Glucose-Capillary: 132 mg/dL — ABNORMAL HIGH (ref 70–99)
Glucose-Capillary: 243 mg/dL — ABNORMAL HIGH (ref 70–99)

## 2018-07-15 LAB — CBC
HCT: 28.1 % — ABNORMAL LOW (ref 36.0–46.0)
Hemoglobin: 9.6 g/dL — ABNORMAL LOW (ref 12.0–15.0)
MCH: 31.6 pg (ref 26.0–34.0)
MCHC: 34.2 g/dL (ref 30.0–36.0)
MCV: 92.4 fL (ref 80.0–100.0)
Platelets: 232 10*3/uL (ref 150–400)
RBC: 3.04 MIL/uL — ABNORMAL LOW (ref 3.87–5.11)
RDW: 12.8 % (ref 11.5–15.5)
WBC: 9 10*3/uL (ref 4.0–10.5)
nRBC: 0 % (ref 0.0–0.2)

## 2018-07-15 LAB — IRON AND TIBC
Iron: 66 ug/dL (ref 28–170)
Saturation Ratios: 27 % (ref 10.4–31.8)
TIBC: 242 ug/dL — ABNORMAL LOW (ref 250–450)
UIBC: 176 ug/dL

## 2018-07-15 LAB — FOLATE: Folate: 7.5 ng/mL (ref >5.9)

## 2018-07-15 LAB — FERRITIN: Ferritin: 132 ng/mL (ref 11–307)

## 2018-07-15 MED ORDER — SODIUM CHLORIDE 0.9 % IV SOLN
1.0000 g | INTRAVENOUS | Status: DC
  Administered 2018-07-15 – 2018-07-16 (×2): 1 g via INTRAVENOUS
  Filled 2018-07-15 (×2): qty 10

## 2018-07-15 MED ORDER — HYDROCORTISONE NA SUCCINATE PF 100 MG IJ SOLR
50.0000 mg | Freq: Three times a day (TID) | INTRAMUSCULAR | Status: DC
  Administered 2018-07-15 – 2018-07-16 (×3): 50 mg via INTRAVENOUS
  Filled 2018-07-15 (×3): qty 2

## 2018-07-15 MED ORDER — INSULIN GLARGINE 100 UNIT/ML ~~LOC~~ SOLN
10.0000 [IU] | Freq: Every morning | SUBCUTANEOUS | Status: DC
  Administered 2018-07-15 – 2018-07-17 (×3): 10 [IU] via SUBCUTANEOUS
  Filled 2018-07-15 (×4): qty 0.1

## 2018-07-15 NOTE — TOC Initial Note (Signed)
Transition of Care [facility]) - Initial/Assessment Note    Patient Details  Name: Marcia Campbell MRN: [id number] Date of Birth: 1921-05-23  Transition of Care [facility]) CM/SW Contact:    Kermit Balo, RN Phone Number: 07/15/2018, 5:06 PM  Clinical Narrative:                 CM went over the therapy note with the daughter and she prefers to have her mother home with [facility] services. CM provided her choice and she is going to decide in the am.   Expected Discharge Plan: Home w Home Health Services Barriers to Discharge: Continued Medical Work up   Patient Goals and CMS Choice   CMS Medicare.gov Compare Post Acute Care list provided to:: Patient Represenative (must comment)(daughter) Choice offered to / list presented to : Adult Children  Expected Discharge Plan and Services Expected Discharge Plan: Home w Home Health Services         Expected Discharge Date: 07/15/18                                    Prior Living Arrangements/Services   Lives with:: Adult Children          Need for Family Participation in Patient Care: Yes (Comment) Care giver support system in place?: Yes (comment)(daughter can provide 24 hour care) Current home services: DME(wheelchair, walker, rollator, shower seat,) Criminal Activity/Legal Involvement Pertinent to Current Situation/Hospitalization: No - Comment as needed  Activities of Daily Living Home Assistive Devices/Equipment: None ADL Screening (condition at time of admission) Patient's cognitive ability adequate to safely complete daily activities?: No Is the patient deaf or have difficulty hearing?: No Does the patient have difficulty seeing, even when wearing glasses/contacts?: No Does the patient have difficulty concentrating, remembering, or making decisions?: Yes Patient able to express need for assistance with ADLs?: No Does the patient have difficulty dressing or bathing?: Yes Independently performs ADLs?: No Communication: Needs  assistance Is this a change from baseline?: Pre-admission baseline Dressing (OT): Needs assistance Is this a change from baseline?: Pre-admission baseline Grooming: Needs assistance Is this a change from baseline?: Pre-admission baseline Feeding: Needs assistance Is this a change from baseline?: Pre-admission baseline Bathing: Needs assistance Is this a change from baseline?: Pre-admission baseline Toileting: Needs assistance Is this a change from baseline?: Pre-admission baseline In/Out Bed: Needs assistance Is this a change from baseline?: Pre-admission baseline Walks in Home: Dependent Is this a change from baseline?: Pre-admission baseline Does the patient have difficulty walking or climbing stairs?: Yes Weakness of Legs: Both Weakness of Arms/Hands: Both  Permission Sought/Granted                  Emotional Assessment Appearance:: Appears stated age     Orientation: : Oriented to Self   Psych Involvement: No (comment)  Admission diagnosis:  Dehydration [E86.0] Myxedema coma (HCC) [E03.5] Patient Active Problem List   Diagnosis Date Noted  . AMS (altered mental status) 07/12/2018  . Hypothyroidism 07/12/2018  . Leg weakness   . Rigidity   . Weakness 08/27/2014  . Anemia, unspecified 05/28/2013  . Thrombocytopenia, unspecified (HCC) 05/28/2013  . Moderate dementia with behavioral disturbance (HCC) 12/12/2012  . 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 Glaucoma 07/25/2011  . Positive PPD, treated 07/25/2011  . Diverticulosis 07/25/2011  . Anemia, iron deficiency 07/25/2011  . Small bowel obstruction (HCC) 07/25/2011  . DM (diabetes mellitus) (HCC) 07/24/2011  . Hyperlipemia  07/24/2011   PCP:  Pearline Cablesopland, Jessica C, MD Pharmacy:   [phone] [place]- [place], [place] - [phone] [facility] [address] [address] [place] [id number] Phone: [phone] Fax: [phone]     Social Determinants of Health (SDOH) Interventions     Readmission Risk Interventions No flowsheet data found.

## 2018-07-15 NOTE — Progress Notes (Signed)
Physical Therapy Treatment Patient Details Name: Marcia Campbell MRN:Marcia Campbell [id number] DOB: November 06, 1921 Today's Date: 07/15/2018    History of Present Illness Marcia Campbell is a 85 y.o. female with medical history significant of dementia, diabetes, hyperlipidemia, hypertension and anemia of chronic disease who was brought in by family is due to her sudden onset of confusion today.      PT Comments    Pt admitted with above diagnosis. Pt currently with functional limitations due to the deficits listed below (see PT Problem List). Pt was able to sit EOB with PT with min assist to come to EOB.  Pt resisting standing.  Pt kept trying to lie back down.  Appears to be at baseline but if daughter cannot provide min assist recommend SNF.  Will continue to follow acutely.   Pt will benefit from skilled PT to increase their independence and safety with mobility to allow discharge to the venue listed below.     Follow Up Recommendations  SNF;Supervision/Assistance - 24 hour(pt at baseline mobility, SNF if no 24 hour care, HHPT if pt has 24 hr care)     Equipment Recommendations  None recommended by PT    Recommendations for Other Services       Precautions / Restrictions Precautions Precautions: Fall Precaution Comments: dementia Restrictions Weight Bearing Restrictions: No    Mobility  Bed Mobility Overal bed mobility: Needs Assistance Bed Mobility: Supine to Sit     Supine to sit: Min assist     General bed mobility comments: pt with  initiation of task once LEs to EOB she reached for PT hand and only min assist to come to EOB.   Transfers Overall transfer level: Needs assistance Equipment used: None             General transfer comment: Pt did not want to stand and resisted standing.  Did kick feet to command but would not stand and did not want to get to chair.   Ambulation/Gait             General Gait Details: non-ambulatory   Stairs              Wheelchair Mobility    Modified Rankin (Stroke Patients Only)       Balance Overall balance assessment: Needs assistance Sitting-balance support: Feet supported;No upper extremity supported Sitting balance-Leahy Scale: Fair Sitting balance - Comments: pt sat EOB with close min guard   Standing balance support: During functional activity;Bilateral upper extremity supported Standing balance-Leahy Scale: Poor Standing balance comment: dependent on physical assist                            Cognition Arousal/Alertness: Awake/alert Behavior During Therapy: Flat affect Overall Cognitive Status: History of cognitive impairments - at baseline                                 General Comments: pt with dementia, non-verbal      Exercises General Exercises - Lower Extremity Long Arc Quad: AROM;Both;10 reps;Seated    General Comments        Pertinent Vitals/Pain Pain Assessment: Faces Faces Pain Scale: Hurts even more Pain Location: with movement of L hand Pain Descriptors / Indicators: Grimacing Pain Intervention(s): Limited activity within patient's tolerance;Monitored during session;Repositioned    Home Living  Prior Function            PT Goals (current goals can now be found in the care plan section) Acute Rehab PT Goals Patient Stated Goal: doesnt speak Progress towards PT goals: Progressing toward goals    Frequency    Min 2X/week      PT Plan Discharge plan needs to be updated    Co-evaluation              AM-PAC PT "6 Clicks" Mobility   Outcome Measure  Help needed turning from your back to your side while in a flat bed without using bedrails?: A Little Help needed moving from lying on your back to sitting on the side of a flat bed without using bedrails?: A Little Help needed moving to and from a bed to a chair (including a wheelchair)?: A Lot Help needed standing up from a chair  using your arms (e.g., wheelchair or bedside chair)?: A Lot Help needed to walk in hospital room?: Total Help needed climbing 3-5 steps with a railing? : Total 6 Click Score: 12    End of Session Equipment Utilized During Treatment: Gait belt Activity Tolerance: Patient limited by fatigue Patient left: with call bell/phone within reach;in bed;with bed alarm set Nurse Communication: Mobility status PT Visit Diagnosis: Unsteadiness on feet (R26.81);Muscle weakness (generalized) (M62.81)     Time: 0092-3300 PT Time Calculation (min) (ACUTE ONLY): 10 min  Charges:  $Therapeutic Activity: 8-22 mins                     Marcia Campbell,PT Acute Rehabilitation Services Pager:  [phone]  Office:  [phone]     Berline Lopes 07/15/2018, 3:24 PM

## 2018-07-15 NOTE — Progress Notes (Signed)
PROGRESS NOTE    Marcia Campbell  XEN:[id number] DOB: [birth year] DOA: 07/12/2018 PCP: Pearline Cables, MD    Brief Narrative:  Marcia Campbell is a 85 y.o. female with medical history significant of dementia, diabetes, hyperlipidemia, hypertension and anemia of chronic disease who was brought in by family is due to her sudden onset of confusion.Patient was at her baseline which is dementia was talking and laughing watching TV.  MRI and MRA of the head shows MRI HEAD IMPRESSION:  Moderately motion degraded exam. No definite acute intracranial abnormality identified.  Advanced age-related cerebral atrophy with mild to moderate chronic microvascular ischemic disease.  Severely limited exam due to extensive motion artifact.  Gross patency of the major arterial vasculature within the head. No obvious large vessel occlusion. Evaluation for focal hemodynamically significant stenosis fairly limited on this exam.    Assessment & Plan:   Principal Problem:   AMS (altered mental status) Active Problems:   DM (diabetes mellitus) (HCC)   Hyperlipemia   Anemia, iron deficiency   Moderate dementia with behavioral disturbance (HCC)   Hypothyroidism   Acute metabolic encephalopathy:  Much improved. She is alert ,saying yeah to all the questions. Following simple commands. Slightly better than yesterday. Spoke with the daughter ove the phone , who also communicated with her mom and reports she is talking over the phone.  MRI brain without contrast did not show any acute stroke.  EEG does not show any epileptiform activity.  Differential include mild AKI , vs myxedema coma as evident with low free f4 and elevated TSH.  Started her on IV synthroid 50 mcg IV daily, transition to oral synthroid from tomorrow.  Started her on IV Hydrocortisone 100 mg every 8 hours, start tapering the hydrocortisone.  Repeat UA and urine cultures.UA shows trace leukocytes, with positive nitrites and many  bacteria. Urine cultures sent . Will start her on IV rocephin for UTI.     UTI: Urine cultures sent and start the patient on rocephin.     Mild oral dysphagia: No sings of aspiration.  SLP eval recommending dysphagia 3 diet.    Anemia of chronic disease:  Vit b12 low normal.  Anemia panel ordered.    Diabetes mellitus:  Hypoglycemic episodes resolved.  Since starting hydrocortisone her cbg's have elevated. Will restart the lantus at 8 units daily.  CBG (last 3)  Recent Labs    07/14/18 2150 07/15/18 0615 07/15/18 1241  GLUCAP 298* 191* 340*   Continue with SSI.      Hypothyroidism:  Resume synthroid.    DVT prophylaxis: lovenox.  Code Status: full code.  Family Communication:  None at bedside, discussed with daughter on 07/15/2018 Disposition Plan: pending PT/ot EVAL, possibly home with home PT.   Consultants:   Neurology.   Procedures:EEG   Antimicrobials:none.    Subjective: ALERT , replying yeah to all the questions. Following simple commands.   Objective: Vitals:   07/14/18 2300 07/15/18 0410 07/15/18 0846 07/15/18 1242  BP: (!) 127/43 140/78 112/68 (!) 124/54  Pulse: 87 79 80 83  Resp: 16 19 18 18   Temp: 98.6 F (37 C) 98.8 F (37.1 C) 97.7 F (36.5 C) 98 F (36.7 C)  TempSrc: Oral Oral Oral Axillary  SpO2: 96% 100% 100% 100%  Weight:      Height:        Intake/Output Summary (Last 24 hours) at 07/15/2018 1328 Last data filed at 07/15/2018 0900 Gross per 24 hour  Intake 320 ml  Output 1350 ml  Net -1030 ml   Filed Weights   07/12/18 2347  Weight: 38.4 kg    Examination:  General exam: alert and comfortable. Respiratory system: Clear to auscultation. Respiratory effort normal. No wheezing or rhonchi.  Cardiovascular system: S1 & S2 heard, RRR.  No pedal edema. Gastrointestinal system: Abdomen is nondistended, soft and nontender.  Normal bowel sounds heard. Central nervous system: alert and following commands.  Extremities:  no pedal edema.  Skin: No rashes, lesions or ulcers Psychiatry: .cannot be assessed.     Data Reviewed: I have personally reviewed following labs and imaging studies  CBC: Recent Labs  Lab 07/12/18 1711 07/13/18 0036 07/14/18 1016  WBC 5.4 8.3 4.0  NEUTROABS 2.8  --   --   HGB 10.5* 10.0* 9.9*  HCT 32.9* 30.8* 30.6*  MCV 97.3 95.4 97.8  PLT 264 255 139*   Basic Metabolic Panel: Recent Labs  Lab 07/12/18 1711 07/12/18 1718 07/13/18 0036 07/14/18 1016  NA 143  --  140 140  K 3.8  --  4.1 4.2  CL 111  --  111 112*  CO2 18*  --  18* 17*  GLUCOSE 148*  --  151* 132*  BUN 18  --  18 10  CREATININE 1.14* 1.00 1.01* 0.97  CALCIUM 9.1  --  9.2 9.1   GFR: Estimated Creatinine Clearance: 21.5 mL/min (by C-G formula based on SCr of 0.97 mg/dL). Liver Function Tests: Recent Labs  Lab 07/12/18 1711 07/13/18 0036  AST 16 18  ALT 10 11  ALKPHOS 48 51  BILITOT 0.6 0.5  PROT 6.4* 6.3*  ALBUMIN 3.3* 3.4*   No results for input(s): LIPASE, AMYLASE in the last 168 hours. Recent Labs  Lab 07/12/18 2003  AMMONIA 16   Coagulation Profile: Recent Labs  Lab 07/12/18 1711  INR 1.1   Cardiac Enzymes: Recent Labs  Lab 07/12/18 1711  TROPONINI <0.03   BNP (last 3 results) No results for input(s): PROBNP in the last 8760 hours. HbA1C: Recent Labs    07/13/18 0036  HGBA1C 6.1*   CBG: Recent Labs  Lab 07/14/18 1131 07/14/18 1604 07/14/18 2150 07/15/18 0615 07/15/18 1241  GLUCAP 117* 171* 298* 191* 340*   Lipid Profile: No results for input(s): CHOL, HDL, LDLCALC, TRIG, CHOLHDL, LDLDIRECT in the last 72 hours. Thyroid Function Tests: Recent Labs    07/12/18 2003 07/12/18 2116  TSH 12.208*  --   FREET4  --  0.72*   Anemia Panel: Recent Labs    07/12/18 2003  VITAMINB12 299   Sepsis Labs: No results for input(s): PROCALCITON, LATICACIDVEN in the last 168 hours.  Recent Results (from the past 240 hour(s))  Culture, Urine     Status: None  (Preliminary result)   Collection Time: 07/14/18  6:38 PM  Result Value Ref Range Status   Specimen Description URINE, CATHETERIZED  Final   Special Requests NONE  Final   Culture   Final    CULTURE REINCUBATED FOR BETTER GROWTH Performed at [facility], [address] [place], [place] [address]    Report Status PENDING  Incomplete         Radiology Studies: No results found.      Scheduled Meds: . aspirin  81 mg Oral Daily  . enoxaparin (LOVENOX) injection  30 mg Subcutaneous Q24H  . hydrocortisone sod succinate (SOLU-CORTEF) inj  50 mg Intravenous Q8H  . insulin aspart  0-5 Units Subcutaneous QHS  . insulin aspart  0-9 Units Subcutaneous TID WC  . levothyroxine  50 mcg Intravenous Daily  . QUEtiapine  12.5 mg Oral QHS   Continuous Infusions:    LOS: 1 day    Time spent: 5430  MINUTES    Kathlen ModyVijaya Chaz Mcglasson, MD [facility] Pager [phone]  If 7PM-7AM, please contact night-coverage [email or web address] Password [email or web address] 07/15/2018, 1:28 PM

## 2018-07-16 LAB — GLUCOSE, CAPILLARY
Glucose-Capillary: 179 mg/dL — ABNORMAL HIGH (ref 70–99)
Glucose-Capillary: 137 mg/dL — ABNORMAL HIGH (ref 70–99)
Glucose-Capillary: 232 mg/dL — ABNORMAL HIGH (ref 70–99)
Glucose-Capillary: 157 mg/dL — ABNORMAL HIGH (ref 70–99)

## 2018-07-16 MED ORDER — HYDROCORTISONE NA SUCCINATE PF 100 MG IJ SOLR
50.0000 mg | Freq: Two times a day (BID) | INTRAMUSCULAR | Status: DC
  Administered 2018-07-16: 50 mg via INTRAVENOUS
  Filled 2018-07-16: qty 2

## 2018-07-16 MED ORDER — LEVOTHYROXINE SODIUM 25 MCG PO TABS
25.0000 ug | ORAL_TABLET | Freq: Every day | ORAL | Status: DC
  Administered 2018-07-16 – 2018-07-17 (×2): 25 ug via ORAL
  Filled 2018-07-16 (×2): qty 1

## 2018-07-16 NOTE — TOC Progression Note (Signed)
Transition of Care [facility]) - Progression Note    Patient Details  Name: Marcia Campbell MRN: [id number] Date of Birth: Sep 07, 1921  Transition of Care [facility] CM/SW Contact  Flavio Lindroth, Manfred Arch, RN Phone Number: 07/16/2018, 1:28 PM Clinical Narrative:   Pt will discharge home in care of daughter - pt has been living with daughter PTA.  Per daughter; pt can walk using walker but is mostly confined to a wheelchair.  Daughter informed CM that pt has all needed equipment in the home - pt declining SNF as recommended and instead interested in [facility].  Previous CM provided medicare.gov list - daughter chose [facility] - agency contacted and referral accepted.  Daughter plans to transport pt via private vehicle back home    Expected Discharge Plan: Home w Home Health Services Barriers to Discharge: Continued Medical Work up  Expected Discharge Plan and Services Expected Discharge Plan: Home w Home Health Services       Living arrangements for the past 2 months: Single Family Home Expected Discharge Date: 07/15/18                         HH Arranged: RN, PT, OT, Nurse's Aide, Social Work [employer] Agency: [facility] Date HH Agency Contacted: 07/16/18 Time HH Agency Contacted: 1200 Representative spoke with at [facility] Agency: Ann Held   Social Determinants of Health (SDOH) Interventions    Readmission Risk Interventions No flowsheet data found.

## 2018-07-16 NOTE — Progress Notes (Signed)
PROGRESS NOTE    Marcia RoysClara R Katt  ZOX:[id number] DOB: 1921-07-22 DOA: 07/12/2018 PCP: Pearline Cablesopland, Jessica C, MD    Brief Narrative:  Marcia Campbell is a 85 y.o. female with medical history significant of dementia, diabetes, hyperlipidemia, hypertension and anemia of chronic disease who was brought in by family is due to her sudden onset of confusion.Patient was at her baseline which is dementia was talking and laughing watching TV.  MRI and MRA of the head shows MRI HEAD IMPRESSION:  Moderately motion degraded exam. No definite acute intracranial abnormality identified.  Advanced age-related cerebral atrophy with mild to moderate chronic microvascular ischemic disease.  Severely limited exam due to extensive motion artifact.  Gross patency of the major arterial vasculature within the head. No obvious large vessel occlusion. Evaluation for focal hemodynamically significant stenosis fairly limited on this exam.    Assessment & Plan:   Principal Problem:   AMS (altered mental status) Active Problems:   DM (diabetes mellitus) (HCC)   Hyperlipemia   Anemia, iron deficiency   Moderate dementia with behavioral disturbance (HCC)   Hypothyroidism   Acute metabolic encephalopathy:  Much improved. She is alert ,saying yeah to all the questions. Following simple commands. Spoke with the daughter ove the phone , who also communicated with her mom and reports she is talking over the phone.  MRI brain without contrast did not show any acute stroke.  EEG does not show any epileptiform activity.  Differential include mild AKI , vs myxedema coma as evident with low free f4 and elevated TSH.  Started her on IV synthroid 50 mcg IV daily, transition to oral synthroid today.  Started her on IV Hydrocortisone 100 mg every 8 hours, start tapering the hydrocortisone.  Repeat UA and urine cultures.UA shows trace leukocytes, with positive nitrites and many bacteria. Urine cultures sent . Started her  on IV rocephin for UTI.     UTI: Urine cultures sent and start the patient on rocephin.   Urine cultures grew 1,00,000 gram negative rods. Further identification and sensitivities are pending.    Mild oral dysphagia: No sings of aspiration.  SLP eval recommending dysphagia 3 diet.    Anemia of chronic disease:  Vit b12 low normal. Hemoglobin around 9.  Anemia panel shows normal iron and ferririn levels.    Diabetes mellitus:  Hypoglycemic episodes resolved.  Since starting hydrocortisone her cbg's have elevated. Will restart the lantus at 8 units daily.  CBG (last 3)  Recent Labs    07/15/18 1654 07/15/18 2040 07/16/18 0611  GLUCAP 132* 243* 179*   Continue with SSI.      Hypothyroidism:  Resume synthroid.    DVT prophylaxis: lovenox.  Code Status: full code.  Family Communication:  None at bedside, discussed with daughter on 07/15/2018 Disposition Plan: pending PT/ot EVAL, possibly home with home PT.   Consultants:   Neurology.   Procedures:EEG MRI brain without contrast.    Antimicrobials:none.    Subjective: Eating her breakfast, no chest pain or sob.  Following simple commands.   Objective: Vitals:   07/15/18 2309 07/16/18 0346 07/16/18 0743 07/16/18 1134  BP: 134/61 (!) 105/52 107/72 116/68  Pulse: 87 61 92 87  Resp: 18 18 16 20   Temp: 99.1 F (37.3 C) 98.6 F (37 C) 97.6 F (36.4 C) 97.7 F (36.5 C)  TempSrc: Oral Axillary Oral Oral  SpO2: 97% 99% 100% 100%  Weight:      Height:        Intake/Output Summary (  Last 24 hours) at 07/16/2018 1138 Last data filed at 07/16/2018 4081 Gross per 24 hour  Intake 840 ml  Output 1150 ml  Net -310 ml   Filed Weights   07/12/18 2347  Weight: 38.4 kg    Examination:  General exam: alert and comfortable. Respiratory system: clear to auscultation bilaterally. No wheezing.  Cardiovascular system: S1 & S2 heard, RRR.  No pedal edema. Gastrointestinal system: Abdomen is nondistended, soft and  nontender.  Normal bowel sounds heard. Central nervous system: alert and following commands. Not oriented. Minimally conversing.  Extremities: no pedal edema.  Skin: No rashes, lesions or ulcers Psychiatry: .cannot be assessed.     Data Reviewed: I have personally reviewed following labs and imaging studies  CBC: Recent Labs  Lab 07/12/18 1711 07/13/18 0036 07/14/18 1016 07/15/18 1548  WBC 5.4 8.3 4.0 9.0  NEUTROABS 2.8  --   --   --   HGB 10.5* 10.0* 9.9* 9.6*  HCT 32.9* 30.8* 30.6* 28.1*  MCV 97.3 95.4 97.8 92.4  PLT 264 255 139* 232   Basic Metabolic Panel: Recent Labs  Lab 07/12/18 1711 07/12/18 1718 07/13/18 0036 07/14/18 1016  NA 143  --  140 140  K 3.8  --  4.1 4.2  CL 111  --  111 112*  CO2 18*  --  18* 17*  GLUCOSE 148*  --  151* 132*  BUN 18  --  18 10  CREATININE 1.14* 1.00 1.01* 0.97  CALCIUM 9.1  --  9.2 9.1   GFR: Estimated Creatinine Clearance: 21.5 mL/min (by C-G formula based on SCr of 0.97 mg/dL). Liver Function Tests: Recent Labs  Lab 07/12/18 1711 07/13/18 0036  AST 16 18  ALT 10 11  ALKPHOS 48 51  BILITOT 0.6 0.5  PROT 6.4* 6.3*  ALBUMIN 3.3* 3.4*   No results for input(s): LIPASE, AMYLASE in the last 168 hours. Recent Labs  Lab 07/12/18 2003  AMMONIA 16   Coagulation Profile: Recent Labs  Lab 07/12/18 1711  INR 1.1   Cardiac Enzymes: Recent Labs  Lab 07/12/18 1711  TROPONINI <0.03   BNP (last 3 results) No results for input(s): PROBNP in the last 8760 hours. HbA1C: No results for input(s): HGBA1C in the last 72 hours. CBG: Recent Labs  Lab 07/15/18 0615 07/15/18 1241 07/15/18 1654 07/15/18 2040 07/16/18 0611  GLUCAP 191* 340* 132* 243* 179*   Lipid Profile: No results for input(s): CHOL, HDL, LDLCALC, TRIG, CHOLHDL, LDLDIRECT in the last 72 hours. Thyroid Function Tests: No results for input(s): TSH, T4TOTAL, FREET4, T3FREE, THYROIDAB in the last 72 hours. Anemia Panel: Recent Labs    07/15/18 1548   FOLATE 7.5  FERRITIN 132  TIBC 242*  IRON 66  RETICCTPCT 1.4   Sepsis Labs: No results for input(s): PROCALCITON, LATICACIDVEN in the last 168 hours.  Recent Results (from the past 240 hour(s))  Culture, Urine     Status: Abnormal (Preliminary result)   Collection Time: 07/14/18  6:38 PM  Result Value Ref Range Status   Specimen Description URINE, CATHETERIZED  Final   Special Requests NONE  Final   Culture (A)  Final    >=100,000 COLONIES/mL ESCHERICHIA COLI SUSCEPTIBILITIES TO FOLLOW Performed at [facility], [address] [place], [place] [address]    Report Status PENDING  Incomplete         Radiology Studies: No results found.      Scheduled Meds: . aspirin  81 mg Oral Daily  .  enoxaparin (LOVENOX) injection  30 mg Subcutaneous Q24H  . hydrocortisone sod succinate (SOLU-CORTEF) inj  50 mg Intravenous Q12H  . insulin aspart  0-5 Units Subcutaneous QHS  . insulin aspart  0-9 Units Subcutaneous TID WC  . insulin glargine  10 Units Subcutaneous q morning - 10a  . levothyroxine  25 mcg Oral QAC breakfast  . QUEtiapine  12.5 mg Oral QHS   Continuous Infusions: . cefTRIAXone (ROCEPHIN)  IV 1 g (07/15/18 1531)     LOS: 2 days    Time spent: 80  MINUTES    Kathlen Mody, MD [facility] Pager [phone]  If 7PM-7AM, please contact night-coverage [email or web address] Password [email or web address] 07/16/2018, 11:38 AM

## 2018-07-17 DIAGNOSIS — G9341 Metabolic encephalopathy: Secondary | ICD-10-CM | POA: Diagnosis present

## 2018-07-17 LAB — GLUCOSE, CAPILLARY
Glucose-Capillary: 153 mg/dL — ABNORMAL HIGH (ref 70–99)
Glucose-Capillary: 246 mg/dL — ABNORMAL HIGH (ref 70–99)
Glucose-Capillary: 226 mg/dL — ABNORMAL HIGH (ref 70–99)

## 2018-07-17 LAB — URINE CULTURE: Culture: >=100000 — AB

## 2018-07-17 MED ORDER — LEVOTHYROXINE SODIUM 25 MCG PO TABS: 25.0000 ug | ORAL_TABLET | Freq: Every day | ORAL | 0 refills | Status: DC

## 2018-07-17 MED ORDER — QUETIAPINE FUMARATE 25 MG PO TABS: 25.0000 mg | ORAL_TABLET | Freq: Every day | ORAL | 6 refills | Status: DC

## 2018-07-17 MED ORDER — SODIUM CHLORIDE 0.9 % IV SOLN
1.0000 g | Freq: Once | INTRAVENOUS | Status: AC
  Administered 2018-07-17: 1 g via INTRAVENOUS
  Filled 2018-07-17: qty 10

## 2018-07-17 NOTE — TOC Transition Note (Signed)
Transition of Care [facility]) - CM/SW Discharge Note   Patient Details  Name: ASTER DAUER MRN: [id number] Date of Birth: 06/09/1921  [facility] CM/SW Contact:  Kermit Balo, RN Phone Number: 07/17/2018, 2:16 PM   Clinical Narrative:    Pt discharging home with her daughter. Daughter requesting PTAR home. CM arranged for 3 pm. Bedside RN aware and in agreement.  [facility] aware of d/c.    Final next level of care: Home w Home Health Services Barriers to Discharge: Barriers Resolved   Patient Goals and CMS Choice Patient states their goals for this hospitalization and ongoing recovery are:: (CM spoke direcly with daughter - pt not oriented) CMS Medicare.gov Compare Post Acute Care list provided to:: Patient Represenative (must comment)(daughter) Choice offered to / list presented to : Adult Children  Discharge Placement                       Discharge Plan and Services   Discharge Planning Services: CM Consult                      HH Arranged: RN, PT, OT, Nurse's Aide, Social Work [facility] Agency: [facility] Date HH Agency Contacted: 07/16/18 Time HH Agency Contacted: 1200 Representative spoke with at [facility] Agency: Ann Held  Social Determinants of Health (SDOH) Interventions     Readmission Risk Interventions No flowsheet data found.

## 2018-07-17 NOTE — Discharge Summary (Signed)
Physician Discharge Summary  Marcia Campbell:[id number] DOB: 1921/10/03 DOA: 07/12/2018  PCP: Marcia Cables, MD  Admit date: 07/12/2018 Discharge date: 07/17/2018  Admitted From: Home Disposition: Home  Recommendations for Outpatient Follow-up:  1. Follow up with PCP in 1 week 2. Please obtain BMP/CBC in one week 3. Workup/Management of hypothyroidism 4. Please follow up on the following pending results: None  Home Health: PT, OT, RN, Aide, SW Equipment/Devices: None  Discharge Condition: Stable CODE STATUS: Full code Diet recommendation: Dysphagia 3 diet   Brief/Interim Summary:  Admission HPI written by Marcia Emery, MD   Chief Complaint: Altered mental status  HPI: Marcia Campbell is a 85 y.o. female with medical history significant of dementia, diabetes, hyperlipidemia, hypertension and anemia of chronic disease who was brought in by family is due to her sudden onset of confusion today.  Patient was at her baseline which is dementia was talking and laughing watching TV.  30 minutes later they found her unresponsive in a chair.  When EMS arrived she was hypotensive.  Good IV fluids.  Mental status improved a little.  Patient was brought to the ER where she was fully evaluated.  A code stroke was initially activated.  Neurology has evaluated patient with no evidence of CVA ongoing.  Work-up still continue with MRI ordered.  Patient however was found to have significant hypothyroidism.  TSH was more than 12 and free T4 is low.  She has been given fluids and initial dose of levothyroxine.  Mental status slightly improved but patient is being admitted for observation and to complete work-up for possible stroke.  Seizure was contemplated but EEG was normal..  ED Course: Her temperature is 95.9 blood pressure 97/58 pulse 70 respiratory 21 oxygen sats 97% on room air.  Urinalysis is negative for acute except for moderate leukocytosis with no bacteria.  Urine drug screen  is negative.  TSH 12.208 free T4 0.72.  B12 is normal CRP is negative ammonia is normal.  Chest x-ray showed no active disease.  Chemistry essentially within normal except for glucose 148 CO2 of 18.  Albumin is 3.3.  CBC showed hemoglobin 10.5 otherwise no significant finding.  MRI has been ordered and patient is being admitted for observation    Hospital course:  Acute metabolic encephalopathy Secondary to urinary tract infection. Improved with antibiotic therapy. Baseline prior to discharge. No acute stroke seen on motion degraded MRI.  Acute UTI E. Coli infection. Treated with Ceftriaxone. Completed course prior to discharge  Dementia Stable. Continued home medications.  Anemia of chronic disease Stable.  Hypothyroidism New diagnosis. Started on Synthroid 25 mcg. Will need outpatient follow-up  Diabetes mellitus Continue home insulin regimen  Dysphagia Dysphagia 3 diet  Discharge Diagnoses:  Principal Problem:   AMS (altered mental status) Active Problems:   DM (diabetes mellitus) (HCC)   Hyperlipemia   Anemia, iron deficiency   Moderate dementia with behavioral disturbance (HCC)   Hypothyroidism    Discharge Instructions   Allergies as of 07/17/2018      Reactions   Penicillins Itching   Has patient had a PCN reaction causing immediate rash, facial/tongue/throat swelling, SOB or lightheadedness with hypotension: Yes Has patient had a PCN reaction causing severe rash involving mucus membranes or skin necrosis: No Has patient had a PCN reaction that required hospitalization No Has patient had a PCN reaction occurring within the last 10 years: No If all of the above answers are "NO", then may proceed with Cephalosporin  use.      Medication List    TAKE these medications   acetaminophen 325 MG tablet Commonly known as:  TYLENOL Take 2 tablets (650 mg total) by mouth every 6 (six) hours as needed for mild pain (temperature >/= 99.5 F). Notes to patient:  None  given today   aspirin 81 MG chewable tablet Chew 1 tablet (81 mg total) by mouth daily.   Insulin Glargine 100 UNIT/ML Solostar Pen Commonly known as:  Lantus SoloStar INJECT 12 UNITS UNDER THE SKIN EVERY NIGHT AT BEDTIME What changed:    how much to take  how to take this  when to take this  additional instructions Notes to patient:  Continue home schedule   lactose free nutrition Liqd Take 237 mLs by mouth daily. What changed:    when to take this  reasons to take this   levothyroxine 25 MCG tablet Commonly known as:  SYNTHROID Take 1 tablet (25 mcg total) by mouth daily before breakfast. Start taking on:  Jul 18, 2018   loperamide 2 MG capsule Commonly known as:  IMODIUM Take 1 capsule (2 mg total) by mouth as needed for diarrhea or loose stools. Not to exceed 8 mg/day Notes to patient:  None given today   QUEtiapine 25 MG tablet Commonly known as:  SEROQUEL Take 1 tablet (25 mg total) by mouth at bedtime.       Allergies  Allergen Reactions  . Penicillins Itching    Has patient had a PCN reaction causing immediate rash, facial/tongue/throat swelling, SOB or lightheadedness with hypotension: Yes Has patient had a PCN reaction causing severe rash involving mucus membranes or skin necrosis: No Has patient had a PCN reaction that required hospitalization No Has patient had a PCN reaction occurring within the last 10 years: No If all of the above answers are "NO", then may proceed with Cephalosporin use.     Consultations:  None   Procedures/Studies: Mr Shirlee Latch ZO Contrast  Result Date: 07/12/2018 CLINICAL DATA:  Initial evaluation for acute altered mental status, unresponsiveness. EXAM: MRI HEAD WITHOUT CONTRAST MRA HEAD WITHOUT CONTRAST TECHNIQUE: Multiplanar, multiecho pulse sequences of the brain and surrounding structures were obtained without intravenous contrast. Angiographic images of the head were obtained using MRA technique without contrast.  COMPARISON:  Prior CT from earlier the same day. FINDINGS: MRI HEAD FINDINGS Brain: Examination moderately degraded by motion artifact. Diffuse prominence of the CSF containing spaces compatible with generalized cerebral atrophy, moderately advanced in nature. Mild to moderate chronic microvascular ischemic changes present within the periventricular and deep white matter both cerebral hemispheres. No abnormal foci of restricted diffusion to suggest acute or subacute ischemia. Gray-white matter differentiation maintained. No encephalomalacia to suggest chronic cortical infarction. No foci of susceptibility artifact to suggest acute or chronic intracranial hemorrhage. No mass lesion, midline shift or mass effect. Diffuse ventricular prominence related global parenchymal volume loss without hydrocephalus. No extra-axial fluid collection. Vascular: Major intracranial vascular flow voids grossly maintained. Skull and upper cervical spine: Craniocervical junction within normal limits. No focal marrow replacing lesion. No scalp soft tissue abnormality. Sinuses/Orbits: Patient status post bilateral ocular lens replacement. Mild scattered mucosal thickening within the ethmoidal air cells. Paranasal sinuses are otherwise clear. No mastoid effusion. Inner ear structures normal. Other: None. MRA HEAD FINDINGS ANTERIOR CIRCULATION: Examination severely degraded by motion artifact. Patent antegrade flow seen within the distal cervical segments of the internal carotid arteries bilaterally. ICAs are patent to the termini without obvious flow-limiting stenosis. A1 segments  grossly patent. Anterior communicating artery not well assessed. Anterior cerebral arteries not well assessed due to motion. M1 segments grossly patent bilaterally. MCA branches grossly perfused bilaterally and fairly symmetric, although poorly assessed due to motion. POSTERIOR CIRCULATION: Vertebral arteries patent to the vertebrobasilar junction without obvious  stenosis. Right vertebral artery dominant. Posterior inferior cerebral arteries not well seen. Basilar grossly patent to its distal aspect without obvious stenosis, although evaluation limited by motion. Superior cerebral arteries patent proximally. Both of the PCAs appear to be largely supplied via the basilar and are grossly perfused to their distal aspects. Evaluation for possible aneurysm fairly limited by motion. IMPRESSION: MRI HEAD IMPRESSION: 1. Moderately motion degraded exam. 2. No definite acute intracranial abnormality identified. 3. Advanced age-related cerebral atrophy with mild to moderate chronic microvascular ischemic disease. MRA HEAD IMPRESSION: 1. Severely limited exam due to extensive motion artifact. 2. Gross patency of the major arterial vasculature within the head. No obvious large vessel occlusion. Evaluation for focal hemodynamically significant stenosis fairly limited on this exam. Electronically Signed   By: Rise Mu M.D.   On: 07/12/2018 23:32   Mr Brain Wo Contrast  Result Date: 07/12/2018 CLINICAL DATA:  Initial evaluation for acute altered mental status, unresponsiveness. EXAM: MRI HEAD WITHOUT CONTRAST MRA HEAD WITHOUT CONTRAST TECHNIQUE: Multiplanar, multiecho pulse sequences of the brain and surrounding structures were obtained without intravenous contrast. Angiographic images of the head were obtained using MRA technique without contrast. COMPARISON:  Prior CT from earlier the same day. FINDINGS: MRI HEAD FINDINGS Brain: Examination moderately degraded by motion artifact. Diffuse prominence of the CSF containing spaces compatible with generalized cerebral atrophy, moderately advanced in nature. Mild to moderate chronic microvascular ischemic changes present within the periventricular and deep white matter both cerebral hemispheres. No abnormal foci of restricted diffusion to suggest acute or subacute ischemia. Gray-white matter differentiation maintained. No  encephalomalacia to suggest chronic cortical infarction. No foci of susceptibility artifact to suggest acute or chronic intracranial hemorrhage. No mass lesion, midline shift or mass effect. Diffuse ventricular prominence related global parenchymal volume loss without hydrocephalus. No extra-axial fluid collection. Vascular: Major intracranial vascular flow voids grossly maintained. Skull and upper cervical spine: Craniocervical junction within normal limits. No focal marrow replacing lesion. No scalp soft tissue abnormality. Sinuses/Orbits: Patient status post bilateral ocular lens replacement. Mild scattered mucosal thickening within the ethmoidal air cells. Paranasal sinuses are otherwise clear. No mastoid effusion. Inner ear structures normal. Other: None. MRA HEAD FINDINGS ANTERIOR CIRCULATION: Examination severely degraded by motion artifact. Patent antegrade flow seen within the distal cervical segments of the internal carotid arteries bilaterally. ICAs are patent to the termini without obvious flow-limiting stenosis. A1 segments grossly patent. Anterior communicating artery not well assessed. Anterior cerebral arteries not well assessed due to motion. M1 segments grossly patent bilaterally. MCA branches grossly perfused bilaterally and fairly symmetric, although poorly assessed due to motion. POSTERIOR CIRCULATION: Vertebral arteries patent to the vertebrobasilar junction without obvious stenosis. Right vertebral artery dominant. Posterior inferior cerebral arteries not well seen. Basilar grossly patent to its distal aspect without obvious stenosis, although evaluation limited by motion. Superior cerebral arteries patent proximally. Both of the PCAs appear to be largely supplied via the basilar and are grossly perfused to their distal aspects. Evaluation for possible aneurysm fairly limited by motion. IMPRESSION: MRI HEAD IMPRESSION: 1. Moderately motion degraded exam. 2. No definite acute intracranial  abnormality identified. 3. Advanced age-related cerebral atrophy with mild to moderate chronic microvascular ischemic disease. MRA HEAD IMPRESSION: 1. Severely limited  exam due to extensive motion artifact. 2. Gross patency of the major arterial vasculature within the head. No obvious large vessel occlusion. Evaluation for focal hemodynamically significant stenosis fairly limited on this exam. Electronically Signed   By: Rise Mu M.D.   On: 07/12/2018 23:32   Dg Chest Port 1 View  Result Date: 07/12/2018 CLINICAL DATA:  Weakness and altered mental status EXAM: PORTABLE CHEST 1 VIEW COMPARISON:  08/29/2014 FINDINGS: The patient's chin obscures the right lung apex. Atherosclerotic calcification of the aortic arch. Heart size within normal limits. The lungs appear clear. No blunting of the costophrenic angles. IMPRESSION: 1.  No active cardiopulmonary disease is radiographically apparent. 2.  Aortic Atherosclerosis (ICD10-I70.0). Electronically Signed   By: Gaylyn Rong M.D.   On: 07/12/2018 18:44   Ct Head Code Stroke Wo Contrast  Result Date: 07/12/2018 CLINICAL DATA:  Code stroke.  Left neglect. EXAM: CT HEAD WITHOUT CONTRAST TECHNIQUE: Contiguous axial images were obtained from the base of the skull through the vertex without intravenous contrast. COMPARISON:  Head CT 08/27/2014 and MRI 08/29/2014 FINDINGS: Brain: There is no evidence of acute infarct, intracranial hemorrhage, mass, midline shift, or extra-axial fluid collection. Cerebral atrophy is again noted including prominent mesial temporal lobe atrophy. Patchy cerebral white matter hypodensities may have mildly progressed and are nonspecific but compatible with mild-to-moderate chronic small vessel ischemic disease. Vascular: Calcified atherosclerosis at the skull base. No hyperdense vessel. Skull: No fracture or focal osseous lesion. Sinuses/Orbits: Minimal mucosal thickening partially visualized in the right maxillary sinus. Clear  mastoid air cells. Bilateral cataract extraction. Other: None. ASPECTS [facility] Stroke Program Early CT Score) - Ganglionic level infarction (caudate, lentiform nuclei, internal capsule, insula, M1-M3 cortex): 7 - Supraganglionic infarction (M4-M6 cortex): 3 Total score (0-10 with 10 being normal): 10 IMPRESSION: 1. No evidence of acute intracranial abnormality. 2. ASPECTS is 10. 3. Chronic small vessel ischemic disease and cerebral atrophy. These results were communicated to Dr. Otelia Limes at 5:39 pm on 07/12/2018 by text page via the [facility] messaging system. Electronically Signed   By: Sebastian Ache M.D.   On: 07/12/2018 17:40      Subjective: Says her bottom hurts  Discharge Exam: Vitals:   07/17/18 0328 07/17/18 0830  BP: (!) 113/50 (!) 101/47  Pulse: 73 70  Resp: 20 20  Temp: 97.6 F (36.4 C) 97.8 F (36.6 C)  SpO2: 99% 98%   Vitals:   07/16/18 1934 07/16/18 2320 07/17/18 0328 07/17/18 0830  BP: (!) 124/51 (!) 145/83 (!) 113/50 (!) 101/47  Pulse: 81 80 73 70  Resp: Temp: 98.6 F (37 C) 97.7 F (36.5 C) 97.6 F (36.4 C) 97.8 F (36.6 C)  TempSrc: Axillary Axillary Axillary Rectal  SpO2: 100% 100% 99% 98%  Weight:      Height:        General: Pt is alert, awake, not in acute distress. Answers yes/no to questions Cardiovascular: RRR, S1/S2 +, no rubs, no gallops Respiratory: CTA bilaterally, no wheezing, no rhonchi Abdominal: Soft, NT, ND, bowel sounds + Extremities: no edema, no cyanosis Skin: buttock with intact and normal appearing skin.    The results of significant diagnostics from this hospitalization (including imaging, microbiology, ancillary and laboratory) are listed below for reference.     Microbiology: Recent Results (from the past 240 hour(s))  Culture, Urine     Status: Abnormal   Collection Time: 07/14/18  6:38 PM  Result Value Ref Range Status   Specimen Description URINE, CATHETERIZED  Final   Special Requests   Final    NONE Performed  at [facility], [address] [place], [place] [address]    Culture >=100,000 COLONIES/mL ESCHERICHIA COLI (A)  Final   Report Status 07/17/2018 FINAL  Final   Organism ID, Bacteria ESCHERICHIA COLI (A)  Final      Susceptibility   Escherichia coli - MIC*    AMPICILLIN 8 SENSITIVE Sensitive     CEFAZOLIN <=4 SENSITIVE Sensitive     CEFTRIAXONE <=1 SENSITIVE Sensitive     CIPROFLOXACIN <=0.25 SENSITIVE Sensitive     GENTAMICIN <=1 SENSITIVE Sensitive     IMIPENEM <=0.25 SENSITIVE Sensitive     NITROFURANTOIN <=16 SENSITIVE Sensitive     TRIMETH/SULFA <=20 SENSITIVE Sensitive     AMPICILLIN/SULBACTAM 4 SENSITIVE Sensitive     PIP/TAZO <=4 SENSITIVE Sensitive     Extended ESBL NEGATIVE Sensitive     * >=100,000 COLONIES/mL ESCHERICHIA COLI     Labs: BNP (last 3 results) No results for input(s): BNP in the last 8760 hours. Basic Metabolic Panel: Recent Labs  Lab 07/12/18 1711 07/12/18 1718 07/13/18 0036 07/14/18 1016  NA 143  --  140 140  K 3.8  --  4.1 4.2  CL 111  --  111 112*  CO2 18*  --  18* 17*  GLUCOSE 148*  --  151* 132*  BUN 18  --  18 10  CREATININE 1.14* 1.00 1.01* 0.97  CALCIUM 9.1  --  9.2 9.1   Liver Function Tests: Recent Labs  Lab 07/12/18 1711 07/13/18 0036  AST 16 18  ALT 10 11  ALKPHOS 48 51  BILITOT 0.6 0.5  PROT 6.4* 6.3*  ALBUMIN 3.3* 3.4*   No results for input(s): LIPASE, AMYLASE in the last 168 hours. Recent Labs  Lab 07/12/18 2003  AMMONIA 16   CBC: Recent Labs  Lab 07/12/18 1711 07/13/18 0036 07/14/18 1016 07/15/18 1548  WBC 5.4 8.3 4.0 9.0  NEUTROABS 2.8  --   --   --   HGB 10.5* 10.0* 9.9* 9.6*  HCT 32.9* 30.8* 30.6* 28.1*  MCV 97.3 95.4 97.8 92.4  PLT 264 255 139* 232   Cardiac Enzymes: Recent Labs  Lab 07/12/18 1711  TROPONINI <0.03   BNP: Invalid input(s): POCBNP CBG: Recent Labs  Lab 07/16/18 0611 07/16/18 1131 07/16/18 1611 07/16/18 2049 07/17/18 0604  GLUCAP 179* 137* 232* 157* 153*    D-Dimer No results for input(s): DDIMER in the last 72 hours. Hgb A1c No results for input(s): HGBA1C in the last 72 hours. Lipid Profile No results for input(s): CHOL, HDL, LDLCALC, TRIG, CHOLHDL, LDLDIRECT in the last 72 hours. Thyroid function studies No results for input(s): TSH, T4TOTAL, T3FREE, THYROIDAB in the last 72 hours.  Invalid input(s): FREET3 Anemia work up Recent Labs    07/15/18 1548  FOLATE 7.5  FERRITIN 132  TIBC 242*  IRON 66  RETICCTPCT 1.4   Urinalysis    Component Value Date/Time   COLORURINE YELLOW 07/14/2018 1854   APPEARANCEUR CLEAR 07/14/2018 1854   LABSPEC 1.010 07/14/2018 1854   PHURINE 5.0 07/14/2018 1854   GLUCOSEU 150 (A) 07/14/2018 1854   HGBUR SMALL (A) 07/14/2018 1854   BILIRUBINUR NEGATIVE 07/14/2018 1854   BILIRUBINUR neg 05/22/2013 1049   KETONESUR NEGATIVE 07/14/2018 1854   PROTEINUR NEGATIVE 07/14/2018 1854   UROBILINOGEN 0.2 08/27/2014 1715   NITRITE POSITIVE (A) 07/14/2018 1854   LEUKOCYTESUR TRACE (A) 07/14/2018 1854   Sepsis Labs Invalid  input(s): PROCALCITONIN,  WBC,  LACTICIDVEN Microbiology Recent Results (from the past 240 hour(s))  Culture, Urine     Status: Abnormal   Collection Time: 07/14/18  6:38 PM  Result Value Ref Range Status   Specimen Description URINE, CATHETERIZED  Final   Special Requests   Final    NONE Performed at [facility], [address], [place], [place] [address]    Culture >=100,000 COLONIES/mL ESCHERICHIA COLI (A)  Final   Report Status 07/17/2018 FINAL  Final   Organism ID, Bacteria ESCHERICHIA COLI (A)  Final      Susceptibility   Escherichia coli - MIC*    AMPICILLIN 8 SENSITIVE Sensitive     CEFAZOLIN <=4 SENSITIVE Sensitive     CEFTRIAXONE <=1 SENSITIVE Sensitive     CIPROFLOXACIN <=0.25 SENSITIVE Sensitive     GENTAMICIN <=1 SENSITIVE Sensitive     IMIPENEM <=0.25 SENSITIVE Sensitive     NITROFURANTOIN <=16 SENSITIVE Sensitive     TRIMETH/SULFA <=20 SENSITIVE Sensitive      AMPICILLIN/SULBACTAM 4 SENSITIVE Sensitive     PIP/TAZO <=4 SENSITIVE Sensitive     Extended ESBL NEGATIVE Sensitive     * >=100,000 COLONIES/mL ESCHERICHIA COLI     SIGNED:   Jacquelin Hawking, MD [facility] 07/17/2018, 10:08 AM

## 2018-07-17 NOTE — Discharge Instructions (Addendum)
Marcia Campbell,  You were in the hospital because of confusion. This is likely because of an infection. You were treated with IV antibiotics and have completed your course in the hospital, so you do not need further antibiotics. You also were found to have hypothyroidism. You were started on Synthroid. Please follow-up with your primary doctor for continued management.

## 2018-07-18 ENCOUNTER — Telehealth: Payer: Self-pay | Admitting: Family Medicine

## 2018-07-18 ENCOUNTER — Telehealth: Payer: Self-pay

## 2018-07-18 DIAGNOSIS — F0391 Unspecified dementia with behavioral disturbance: Secondary | ICD-10-CM | POA: Diagnosis not present

## 2018-07-18 DIAGNOSIS — N390 Urinary tract infection, site not specified: Secondary | ICD-10-CM | POA: Diagnosis not present

## 2018-07-18 DIAGNOSIS — Z794 Long term (current) use of insulin: Secondary | ICD-10-CM | POA: Diagnosis not present

## 2018-07-18 DIAGNOSIS — B9689 Other specified bacterial agents as the cause of diseases classified elsewhere: Secondary | ICD-10-CM | POA: Diagnosis not present

## 2018-07-18 DIAGNOSIS — E119 Type 2 diabetes mellitus without complications: Secondary | ICD-10-CM | POA: Diagnosis not present

## 2018-07-18 DIAGNOSIS — I10 Essential (primary) hypertension: Secondary | ICD-10-CM | POA: Diagnosis not present

## 2018-07-18 DIAGNOSIS — E039 Hypothyroidism, unspecified: Secondary | ICD-10-CM | POA: Diagnosis not present

## 2018-07-18 DIAGNOSIS — Z7982 Long term (current) use of aspirin: Secondary | ICD-10-CM | POA: Diagnosis not present

## 2018-07-18 NOTE — Telephone Encounter (Signed)
Copied from CRM [id number]. Topic: Quick Communication - Home Health Verbal Orders >> Jul 18, 2018  3:13 PM Floria Raveling A wrote: Caller/Agency: Marcelino Duster with [facility] Callback Number: [phone] Requesting OT/PT/Skilled Nursing/Social Work/Speech Therapy: needing verbal for Nursing  Frequency: 1 week 3  And one every other week 6

## 2018-07-18 NOTE — Telephone Encounter (Signed)
Copied from CRM #[id number]. Topic: Quick Communication - Home Health Verbal Orders °>> Jul 18, 2018  3:13 PM Stovall, Shana A wrote: °Caller/Agency: Michelle with [facility] °Callback Number: [phone] °Requesting OT/PT/Skilled Nursing/Social Work/Speech Therapy: needing verbal for Nursing  °Frequency: 1 week 3  °And one every other week 6 °

## 2018-07-19 ENCOUNTER — Telehealth: Payer: Self-pay | Admitting: Family Medicine

## 2018-07-19 ENCOUNTER — Telehealth: Payer: Self-pay

## 2018-07-19 NOTE — Telephone Encounter (Signed)
Duplicate note- verbal orders given.

## 2018-07-19 NOTE — Telephone Encounter (Signed)
Duplicate encounter- verbal orders given.

## 2018-07-19 NOTE — Telephone Encounter (Signed)
LMOM w/ verbal orders.  

## 2018-07-19 NOTE — Telephone Encounter (Signed)
Copied from CRM [id number]. Topic: Quick Communication - Home Health Verbal Orders >> Jul 19, 2018 10:16 AM Deborha Payment wrote: Caller/Agency: Tresa Endo from [facility]  Callback Number: [phone] Requesting PT Frequency: 1x 1week, 2x 2week, 1x 1week

## 2018-07-19 NOTE — Telephone Encounter (Signed)
Spoke w/ Michelle, verbal orders given.  

## 2018-07-19 NOTE — Telephone Encounter (Signed)
Copied from CRM [id number]. Topic: Quick Communication - Home Health Verbal Orders >> Jul 19, 2018  8:20 AM Reggie Pile, [place] wrote: Caller/Agency: Brandon/ [facility] Callback Number: [phone] and it is okay to leave VM with order Requesting OT/PT/Skilled Nursing/Social Work/Speech Therapy: OT Frequency: 1 week 1, 2 week 2, one week 1

## 2018-07-20 NOTE — Progress Notes (Signed)
[facility] [address] [place], [place] [address] [phone]  Date:  07/22/2018   Name:  Marcia Campbell   DOB:  [birth year]   MRN:  [id number]  PCP:  Pearline Cables, MD    Chief Complaint: No chief complaint on file.   History of Present Illness:  Marcia Campbell is a 85 y.o. very pleasant female patient who presents with the following:  Virtual hospital follow-up visit today  Last seen by myself in the office in December Pt location is home Provider location is office Pts daughter [name] gives the history today as this pt has severe dementia  Recent admission to hospital for confusion due to UTI and perhaps also hypothyroidism, see summary below:  Admit date: 07/12/2018 Discharge date: 07/17/2018 Recommendations for Outpatient Follow-up:  1. Follow up with PCP in 1 week 2. Please obtain BMP/CBC in one week 3. Workup/Management of hypothyroidism 4. Please follow up on the following pending results: None  OVF:IEPPI R Marshallis a 85 y.o.femalewith medical history significant ofdementia, diabetes, hyperlipidemia, hypertension and anemia of chronic disease who was brought in by family is due to her sudden onset of confusion today. Patient was at her baseline which is dementia was talking and laughing watching TV. 30 minutes later they found her unresponsive in a chair. When EMS arrived she was hypotensive. Good IV fluids. Mental status improved a little. Patient was brought to the ER where she was fully evaluated. A code stroke was initially activated. Neurology has evaluated patient with no evidence of CVA ongoing. Work-up still continue with MRI ordered. Patient however was found to have significant hypothyroidism. TSH was more than 12 and free T4 is low. She has been given fluids and initial dose of levothyroxine. Mental status slightly improved but patient is being admitted for observation and  to complete work-up for possible stroke. Seizure was contemplated but EEG was normal..  ED Course:Her temperature is 95.9 blood pressure 97/58 pulse 70 respiratory 21 oxygen sats 97% on room air.Urinalysis is negative for acute except for moderate leukocytosis with no bacteria. Urine drug screen is negative. TSH 12.208 free T4 0.72. B12 is normal CRP is negative ammonia is normal. Chest x-ray showed no active disease. Chemistry essentially within normal except for glucose 148 CO2 of 18. Albumin is 3.3. CBC showed hemoglobin 10.5 otherwise no significant finding. MRI has been ordered and patient is being admitted for observation  Hospital course: Acute metabolic encephalopathy Secondary to urinary tract infection. Improved with antibiotic therapy. Baseline prior to discharge. No acute stroke seen on motion degraded MRI. Acute UTI E. Coli infection. Treated with Ceftriaxone. Completed course prior to discharge Dementia Stable. Continued home medications. Anemia of chronic disease Stable. Hypothyroidism New diagnosis. Started on Synthroid 25 mcg. Will need outpatient follow-up Diabetes mellitus Continue home insulin regimen Dysphagia Dysphagia 3 diet  Lab Results  Component Value Date   HGBA1C 6.1 (H) 07/13/2018   Lab Results  Component Value Date   TSH 12.208 (H) 07/12/2018   [name] notes that her mom seems to be back to normal baseline She is done with abx Her urine culture did confirm E. coli UTI  Home health came out recently and wanted them to start checking her glucose daily- however this causes Marine pain and distress [name] wonders if this is really necessary She is currently giving her mom 12 units of lantus daily We had not been able to get labs or  an A1c in a while, as Isys resisted having her blood drawn and of course we did force her, due to risk of injury.  However her A1c from admission shows that we are over- treating this very elderly lady.  She may  be at risk for hypoglycemia and her A1c does not need to be this low I asked [name] to cut her Lantus back to 6 units/day.  We would like to check her thyroid in about 1 month, if possible.  Again, as getting blood work can be a challenge we will plan to do all of her labs at once in 1 month.  Discharge summary requests a BMP and CBC in 1 week.  Her daughter understands and agrees with plan to delay and draw labs concurrently  Patient Active Problem List   Diagnosis Date Noted  . Acute metabolic encephalopathy 07/17/2018  . AMS (altered mental status) 07/12/2018  . Hypothyroidism 07/12/2018  . Leg weakness   . Rigidity   . Weakness 08/27/2014  . Anemia, unspecified 05/28/2013  . Thrombocytopenia, unspecified (HCC) 05/28/2013  . Moderate dementia with behavioral disturbance (HCC) 12/12/2012  . 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 Glaucoma 07/25/2011  . Positive PPD, treated 07/25/2011  . Diverticulosis 07/25/2011  . Anemia, iron deficiency 07/25/2011  . Small bowel obstruction (HCC) 07/25/2011  . DM (diabetes mellitus) (HCC) 07/24/2011  . Hyperlipemia 07/24/2011    Past Medical History:  Diagnosis Date  . Anemia, unspecified 05/28/2013  . Dementia (HCC)   . Diabetes mellitus   . Hypercholesteremia     Past Surgical History:  Procedure Laterality Date  . ABDOMINAL HYSTERECTOMY    . BOWEL RESECTION    . BREAST REDUCTION SURGERY      Social History   Tobacco Use  . Smoking status: Never Smoker  . Smokeless tobacco: Never Used  Substance Use Topics  . Alcohol use: No  . Drug use: No    Family History  Problem Relation Age of Onset  . Diabetes Sister   . Cancer Daughter     Allergies  Allergen Reactions  . Penicillins Itching    Has patient had a PCN reaction causing immediate rash, facial/tongue/throat swelling, SOB or lightheadedness with hypotension: Yes Has patient had a PCN reaction causing severe rash involving mucus membranes or skin necrosis: No Has patient had a  PCN reaction that required hospitalization No Has patient had a PCN reaction occurring within the last 10 years: No If all of the above answers are "NO", then may proceed with Cephalosporin use.     Medication list has been reviewed and updated.  Current Outpatient Medications on File Prior to Visit  Medication Sig Dispense Refill  . acetaminophen (TYLENOL) 325 MG tablet Take 2 tablets (650 mg total) by mouth every 6 (six) hours as needed for mild pain (temperature >/= 99.5 F).    Marland Kitchen aspirin 81 MG chewable tablet Chew 1 tablet (81 mg total) by mouth daily.    . Insulin Glargine (LANTUS SOLOSTAR) 100 UNIT/ML Solostar Pen INJECT 12 UNITS UNDER THE SKIN EVERY NIGHT AT BEDTIME (Patient taking differently: Inject 12 Units into the skin at bedtime. ) 15 mL 9  . lactose free nutrition (BOOST PLUS) LIQD Take 237 mLs by mouth daily. (Patient taking differently: Take 237 mLs by mouth daily as needed (as a supplement). )  0  . levothyroxine (SYNTHROID) 25 MCG tablet Take 1 tablet (25 mcg total) by mouth daily before breakfast. 30 tablet 0  . loperamide (IMODIUM) 2 MG  capsule Take 1 capsule (2 mg total) by mouth as needed for diarrhea or loose stools. Not to exceed 8 mg/day 30 capsule 0  . QUEtiapine (SEROQUEL) 25 MG tablet Take 1 tablet (25 mg total) by mouth at bedtime. 30 tablet 6   No current facility-administered medications on file prior to visit.     Review of Systems:  As per HPI- otherwise negative.   Physical Examination: There were no vitals filed for this visit. There were no vitals filed for this visit. There is no height or weight on file to calculate BMI. Ideal Body Weight:    Spoke with daughter, on the phone today.  Patient suffers to dementia and is not able to participate in a video visit. They are not checking vitals at home, but a home health nurse is coming out on a regular basis  Assessment and Plan: Hospital discharge follow-up - Plan: CBC, Basic metabolic  panel  Severe dementia (HCC)  Acquired hypothyroidism - Plan: TSH  Type 2 diabetes mellitus with complication, with long-term current use of insulin [facility](HCC)  Hospital follow-up visit today for this patient with history of diabetes, dementia.  She recently had an episode of confusion thought due to UTI and possibly hypothyroidism contributing.  She is now taking levothyroxine 25 mcg and her UTIs been treated Her A1c while inpatient was 6.1%, which means we are being overly aggressive with her insulin therapy.  We will decrease insulin from 12 to 6 units daily  We will have her come in for blood draw in about 1 month to check TSH, CBC, BMP.  We will then hope to do an A1c in 3 or 4 months  I have asked her daughter to alert me if they have any questions or if I can do anything else to help  Telephone conversation for approximately 11 minutes today  Signed Abbe AmsterdamJessica Mikel Hardgrove, MD

## 2018-07-22 ENCOUNTER — Encounter: Payer: Self-pay | Admitting: Family Medicine

## 2018-07-22 ENCOUNTER — Ambulatory Visit (INDEPENDENT_AMBULATORY_CARE_PROVIDER_SITE_OTHER): Payer: Medicare Other | Admitting: Family Medicine

## 2018-07-22 ENCOUNTER — Other Ambulatory Visit: Payer: Self-pay

## 2018-07-22 DIAGNOSIS — Z794 Long term (current) use of insulin: Secondary | ICD-10-CM

## 2018-07-22 DIAGNOSIS — E118 Type 2 diabetes mellitus with unspecified complications: Secondary | ICD-10-CM | POA: Diagnosis not present

## 2018-07-22 DIAGNOSIS — F0390 Unspecified dementia without behavioral disturbance: Secondary | ICD-10-CM

## 2018-07-22 DIAGNOSIS — Z09 Encounter for follow-up examination after completed treatment for conditions other than malignant neoplasm: Secondary | ICD-10-CM

## 2018-07-22 DIAGNOSIS — E039 Hypothyroidism, unspecified: Secondary | ICD-10-CM

## 2018-07-22 DIAGNOSIS — F03C0 Unspecified dementia, severe, without behavioral disturbance, psychotic disturbance, mood disturbance, and anxiety: Secondary | ICD-10-CM

## 2018-08-14 ENCOUNTER — Telehealth: Payer: Self-pay | Admitting: Family Medicine

## 2018-08-14 NOTE — Telephone Encounter (Signed)
Spoke with daughter [name] today, she mentioned that her mom is been more agitated recently.  She has not been able to go to her daycare program due to pandemic so her routine is to start. She had been prescribed Seroquel 25 mg, but they decreased to half dose as it caused constipation.  Constipation is now under control I advised increased to full dose of Seroquel, 25 mg, can add a daily stool softener as well as needed They would let me know if any other concerns

## 2018-08-19 DIAGNOSIS — Z20828 Contact with and (suspected) exposure to other viral communicable diseases: Secondary | ICD-10-CM | POA: Diagnosis not present

## 2018-08-21 DIAGNOSIS — Z7982 Long term (current) use of aspirin: Secondary | ICD-10-CM | POA: Diagnosis not present

## 2018-08-21 DIAGNOSIS — Z794 Long term (current) use of insulin: Secondary | ICD-10-CM | POA: Diagnosis not present

## 2018-08-21 DIAGNOSIS — B9689 Other specified bacterial agents as the cause of diseases classified elsewhere: Secondary | ICD-10-CM | POA: Diagnosis not present

## 2018-08-21 DIAGNOSIS — N390 Urinary tract infection, site not specified: Secondary | ICD-10-CM | POA: Diagnosis not present

## 2018-08-21 DIAGNOSIS — F0391 Unspecified dementia with behavioral disturbance: Secondary | ICD-10-CM | POA: Diagnosis not present

## 2018-08-21 DIAGNOSIS — I10 Essential (primary) hypertension: Secondary | ICD-10-CM | POA: Diagnosis not present

## 2018-08-21 DIAGNOSIS — E119 Type 2 diabetes mellitus without complications: Secondary | ICD-10-CM | POA: Diagnosis not present

## 2018-08-21 DIAGNOSIS — E039 Hypothyroidism, unspecified: Secondary | ICD-10-CM | POA: Diagnosis not present

## 2018-08-26 ENCOUNTER — Encounter: Payer: Self-pay | Admitting: Family Medicine

## 2018-08-27 ENCOUNTER — Other Ambulatory Visit: Payer: Medicare Other

## 2018-08-28 ENCOUNTER — Encounter: Payer: Self-pay | Admitting: Family Medicine

## 2018-08-28 ENCOUNTER — Telehealth: Payer: Self-pay | Admitting: Family Medicine

## 2018-08-28 MED ORDER — LEVOTHYROXINE SODIUM 25 MCG PO TABS: 25.0000 ug | ORAL_TABLET | Freq: Every day | ORAL | 3 refills | Status: DC

## 2018-08-28 NOTE — Telephone Encounter (Signed)
Please advise 

## 2018-08-28 NOTE — Telephone Encounter (Signed)
Patient's daughter called and said she needs a refill on her levothyroxine (SYNTHROID) 25 MCG tablet. She said she was prescribed this in the hospital but it was under her  impression she was to be on it for the rest of her life.     [facility] [place], [place] AT [facility] OF [address]  [place] [address]  Phone: [phone] Fax: [phone]

## 2018-09-01 NOTE — Progress Notes (Signed)
[facility] [address] [place], [place] [address] [phone] [phone]  Date:  09/02/2018   Name:  Marcia RoysClara R Campbell   DOB:  21-May-1921   MRN:  [id number]  PCP:  Pearline Cablesopland, Jessica C, MD    Chief Complaint: No chief complaint on file.   History of Present Illness:  Marcia RoysClara R Campbell is a 85 y.o. very pleasant female patient who presents with the following:  Elderly lady with history of significant dementia, hypothyroidism, diabetes Virtual visit today as they were not able to get Marcia Campbell in the car-we had planned in person visit, but clear would not get into her wheelchair to be transferred  Virtual visit today with her daughter Marcia Campbell who is her caregiver.  Patient has dementia and is unable to give history . I confirmed identity of patient's daughter with 2 factors, she gives consent for virtual visit today Marcia Campbell herself is s/p a leg amputation years ago due to bone cancer.  She is now at the point where she is no longer really able to care for her mom at home.  Her mom is having more behavior problems as her dementia worsens, is being less cooperative with transfers and other care   She was admitted to the hospital last month with altered mental status-she had a UTI was also noted to have newly diagnosed hypothyroidism. She was started on levothyroxine at that time- we have not been able to get her in for a recheck as of yet   She is using levothyroxine and we need to check her TSH when we can We started her on Seroquel several months ago for behavior issues - she is taking 25 mg at bedtime  Today Marcia Campbell notes no acute change in her mom's behavior, but just a gradual worsening.  She states she came to the realization today that she can go longer care for her mom at home  We discussed having EMS transfer her mom to the emergency room.  It is possible that she has another UTI or other acute issue, and hospital admission might help  facilitate skilled nursing placement.  Marcia Campbell will think about this option.  She also wishes to investigate some skilled nursing facilities near her, and see if she might be able to place her mom directly.  I offered to help in any way possible Patient Active Problem List   Diagnosis Date Noted  . Acute metabolic encephalopathy 07/17/2018  . AMS (altered mental status) 07/12/2018  . Hypothyroidism 07/12/2018  . Leg weakness   . Rigidity   . Weakness 08/27/2014  . Anemia, unspecified 05/28/2013  . Thrombocytopenia, unspecified (HCC) 05/28/2013  . Moderate dementia with behavioral disturbance (HCC) 12/12/2012  . 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 Glaucoma 07/25/2011  . Positive PPD, treated 07/25/2011  . Diverticulosis 07/25/2011  . Anemia, iron deficiency 07/25/2011  . Small bowel obstruction (HCC) 07/25/2011  . DM (diabetes mellitus) (HCC) 07/24/2011  . Hyperlipemia 07/24/2011    Past Medical History:  Diagnosis Date  . Anemia, unspecified 05/28/2013  . Dementia (HCC)   . Diabetes mellitus   . Hypercholesteremia     Past Surgical History:  Procedure Laterality Date  . ABDOMINAL HYSTERECTOMY    . BOWEL RESECTION    . BREAST REDUCTION SURGERY      Social History   Tobacco Use  . Smoking status: Never Smoker  . Smokeless tobacco: Never Used  Substance Use Topics  . Alcohol use: No  .  Drug use: No    Family History  Problem Relation Age of Onset  . Diabetes Sister   . Cancer Daughter     Allergies  Allergen Reactions  . Penicillins Itching    Has patient had a PCN reaction causing immediate rash, facial/tongue/throat swelling, SOB or lightheadedness with hypotension: Yes Has patient had a PCN reaction causing severe rash involving mucus membranes or skin necrosis: No Has patient had a PCN reaction that required hospitalization No Has patient had a PCN reaction occurring within the last 10 years: No If all of the above answers are "NO", then may proceed with  Cephalosporin use.     Medication list has been reviewed and updated.  Current Outpatient Medications on File Prior to Visit  Medication Sig Dispense Refill  . acetaminophen (TYLENOL) 325 MG tablet Take 2 tablets (650 mg total) by mouth every 6 (six) hours as needed for mild pain (temperature >/= 99.5 F).    Marland Kitchen. aspirin 81 MG chewable tablet Chew 1 tablet (81 mg total) by mouth daily.    . Insulin Glargine (LANTUS SOLOSTAR) 100 UNIT/ML Solostar Pen INJECT 12 UNITS UNDER THE SKIN EVERY NIGHT AT BEDTIME (Patient taking differently: Inject 12 Units into the skin at bedtime. ) 15 mL 9  . lactose free nutrition (BOOST PLUS) LIQD Take 237 mLs by mouth daily. (Patient taking differently: Take 237 mLs by mouth daily as needed (as a supplement). )  0  . levothyroxine (SYNTHROID) 25 MCG tablet Take 1 tablet (25 mcg total) by mouth daily before breakfast. 90 tablet 3  . loperamide (IMODIUM) 2 MG capsule Take 1 capsule (2 mg total) by mouth as needed for diarrhea or loose stools. Not to exceed 8 mg/day 30 capsule 0  . QUEtiapine (SEROQUEL) 25 MG tablet Take 1 tablet (25 mg total) by mouth at bedtime. 30 tablet 6   No current facility-administered medications on file prior to visit.     Review of Systems:  As per HPI- otherwise negative.   Physical Examination:  Ideal Body Weight:    Telephone visit   Assessment and Plan:   ICD-10-CM   1. Severe dementia (HCC)  F03.90   2. Acquired hypothyroidism  E03.9      Follow-up: No follow-ups on file.  No orders of the defined types were placed in this encounter.  No orders of the defined types were placed in this encounter.   Telephone visit today due to advanced dementia.  Patient's daughter is having more difficulty caring for her at home, thinks that her mom likely needs placement.  We discussed options for making this happen.  I offered support and encouragement, reassured Marcia Campbell that she has done a wonderful job caring for her mother all  this time.  However at this point it is more than she can handle at home  Spoke on telephone for approximately 15 minutes  Outpatient Encounter Medications as of 09/02/2018  Medication Sig  . acetaminophen (TYLENOL) 325 MG tablet Take 2 tablets (650 mg total) by mouth every 6 (six) hours as needed for mild pain (temperature >/= 99.5 F).  Marland Kitchen. aspirin 81 MG chewable tablet Chew 1 tablet (81 mg total) by mouth daily.  . Insulin Glargine (LANTUS SOLOSTAR) 100 UNIT/ML Solostar Pen INJECT 12 UNITS UNDER THE SKIN EVERY NIGHT AT BEDTIME (Patient taking differently: Inject 12 Units into the skin at bedtime. )  . lactose free nutrition (BOOST PLUS) LIQD Take 237 mLs by mouth daily. (Patient taking differently: Take 237 mLs  by mouth daily as needed (as a supplement). )  . levothyroxine (SYNTHROID) 25 MCG tablet Take 1 tablet (25 mcg total) by mouth daily before breakfast.  . loperamide (IMODIUM) 2 MG capsule Take 1 capsule (2 mg total) by mouth as needed for diarrhea or loose stools. Not to exceed 8 mg/day  . QUEtiapine (SEROQUEL) 25 MG tablet Take 1 tablet (25 mg total) by mouth at bedtime.   No facility-administered encounter medications on file as of 09/02/2018.      Signed Lamar Blinks, MD

## 2018-09-02 ENCOUNTER — Ambulatory Visit (INDEPENDENT_AMBULATORY_CARE_PROVIDER_SITE_OTHER): Payer: Medicare Other | Admitting: Family Medicine

## 2018-09-02 ENCOUNTER — Other Ambulatory Visit: Payer: Self-pay

## 2018-09-02 ENCOUNTER — Encounter: Payer: Self-pay | Admitting: Family Medicine

## 2018-09-02 DIAGNOSIS — F0390 Unspecified dementia without behavioral disturbance: Secondary | ICD-10-CM | POA: Diagnosis not present

## 2018-09-02 DIAGNOSIS — E039 Hypothyroidism, unspecified: Secondary | ICD-10-CM | POA: Diagnosis not present

## 2018-09-02 DIAGNOSIS — F03C0 Unspecified dementia, severe, without behavioral disturbance, psychotic disturbance, mood disturbance, and anxiety: Secondary | ICD-10-CM

## 2018-09-04 ENCOUNTER — Telehealth: Payer: Self-pay | Admitting: Family Medicine

## 2018-09-04 NOTE — Telephone Encounter (Signed)
Print production planner with [facility]  is calling to let md know the are short staff and the patient will missed her last home health aid visit today. They will request more orders for july

## 2018-09-12 ENCOUNTER — Telehealth: Payer: Self-pay | Admitting: Family Medicine

## 2018-09-12 NOTE — Telephone Encounter (Signed)
Jen with [facility] calling and states that the patient's daughter contacted them to come out and evaluate hospice needs. Delsa Sale would like to know if Dr Lorelei Pont would agree to be the attending physician for orders if the patient is admitted into hospice care? Please advise.  CB#; [phone]

## 2018-09-12 NOTE — Telephone Encounter (Signed)
Please advise 

## 2018-09-12 NOTE — Telephone Encounter (Signed)
Called back- LMOM with Delsa Sale that I can do this, or will defer to their physicians if better

## 2018-10-11 ENCOUNTER — Telehealth: Payer: Self-pay | Admitting: Family Medicine

## 2018-10-11 NOTE — Telephone Encounter (Signed)
Called and [facility] for Marcia Campbell- certainly ok to stop seroquel if not helping

## 2018-10-11 NOTE — Telephone Encounter (Signed)
Please advise 

## 2018-10-11 NOTE — Telephone Encounter (Signed)
Copied from [place] [phone]. Topic: Quick Communication - Rx Refill/Question >> Oct 11, 2018  2:02 PM Erick Blinks wrote: Caller/Agency: [facility] Number: [phone] Requesting OT/PT/Skilled Nursing/Social Work/Speech Therapy: Mendel Ryder called to report that pt's daughter is requesting to stop the use of QUEtiapine (SEROQUEL) 25 MG tablet for pt. Pt's daughter reports that she does not think medication is working well enough. Please advise

## 2018-10-22 ENCOUNTER — Telehealth: Payer: Self-pay | Admitting: *Deleted

## 2018-10-22 NOTE — Telephone Encounter (Signed)
Order is in folder to be signed.  Please fax back after signing.  They do no except stamped signatures.

## 2018-10-22 NOTE — Telephone Encounter (Signed)
Copied from [place] [phone]. Topic: General - Call Back - No Documentation >> Oct 22, 2018  3:18 PM Erick Blinks wrote: Reason for CRM: Leilani Able Calling requesting follow up of recent fax in regards to pt's Supplemental Order. Please advise  Best contact: [phone]

## 2018-12-04 ENCOUNTER — Telehealth: Payer: Self-pay

## 2018-12-04 NOTE — Telephone Encounter (Signed)
Copied from [place] [phone]. Topic: General - Call Back - No Documentation >> Dec 03, 2018 10:59 AM Reyne Dumas L wrote: Reason for CRM:   Pt's daughter states that she received an automated call that told her to call the office, unsure what it was about. [name], can be reached at [phone]

## 2018-12-04 NOTE — Telephone Encounter (Signed)
No documentation of office calling patient.

## 2018-12-27 ENCOUNTER — Telehealth: Payer: Self-pay | Admitting: Family Medicine

## 2018-12-27 NOTE — Telephone Encounter (Signed)
I called Marcia Campbell back.  She has very intermittent ?abd pain- it is hard to say due to her dementia.  Daughter [name] was not sure what she can have for pain.  I advised the tylenol and ibuprofen would be ok for her to take- she has ok renal function.  They may want to try a chewable or liquid formula (for kids) which would be easier for her to take.  Asked them to call me back if any questions or if I can help

## 2018-12-27 NOTE — Telephone Encounter (Signed)
Ria Comment, RN with Hospice, requesting call back from PCP or CMA as patient seems to be having abdominal pain. Ria Comment states that patient will intermittently yell out in pain. Ria Comment was not with patient at time of call but is requesting a call back to discuss what medication are available for patient to take. She also notes that she feels this is not related to bowels as they have a normal bowel regimen.    Ria Comment #  [phone]

## 2019-02-11 NOTE — Progress Notes (Addendum)
[facility] at [facility] [address] [place], [place] [address] [phone]  Date:  02/12/2019   Name:  Marcia Campbell   DOB:  January 22, 1922   MRN:  [id number]  PCP:  Darreld Mclean, MD    Chief Complaint: No chief complaint on file.   History of Present Illness:  Marcia Campbell is a 85 y.o. very pleasant female patient who presents with the following:  Virtual visit today for physical exam Marcia Campbell is a elderly woman with history of hypothyroidism, dementia, diabetes She lives with her daughter Marcia Campbell, and hospice is also involved in her care Marcia Campbell is status post a full leg amputation years ago due to bone cancer.  This limits her ability to physically care for her mother I connected with the patient's home via telephone today.  I confirm that I have contacted the correct patient's household with 2 factors, her daughter gives consent for virtual visit today  I last saw Marcia Campbell in the office in June She is taking Synthroid, insulin, was taking Seroquel at bedtime Myself, daughter Marcia Campbell, Marcia Campbell and her hospice nurse Marcia Campbell Comment are all on the call today as well  Her bowels are moving, her sugar has been ok at home She is talking more recently -seems more interactive and alert She is getting up to chair 3x a week She is eating pretty well- she is fed by spoon but seems willing to eat  She is taking 6 units of lantus daily Her glucose fasting is typically 70-80- they are not checking her glucose other times of the day  Not taking seroquel- she is doing ok at night, sleeping pretty well without this medication  They are watching carefully for any sig of a bedsore developing  She may help out with transfers and sometimes she requires total assist -just depends on how she is feeling  Her BP last week was 108/62, RRR 60-70s Oxygen 96-98%   Invited and answered all questions of patient's daughter and caregivers Patient herself did  not speak during the visit, her daughter reports that she fell asleep    Patient Active Problem List   Diagnosis Date Noted  . Acute metabolic encephalopathy 51/76/1607  . AMS (altered mental status) 07/12/2018  . Hypothyroidism 07/12/2018  . Leg weakness   . Rigidity   . Weakness 08/27/2014  . Anemia, unspecified 05/28/2013  . Thrombocytopenia, unspecified ([place]) 05/28/2013  . Moderate dementia with behavioral disturbance ([place]) 12/12/2012  . 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 Glaucoma 07/25/2011  . Positive PPD, treated 07/25/2011  . Diverticulosis 07/25/2011  . Anemia, iron deficiency 07/25/2011  . Small bowel obstruction ([place]) 07/25/2011  . DM (diabetes mellitus) ([place]) 07/24/2011  . Hyperlipemia 07/24/2011    Past Medical History:  Diagnosis Date  . Anemia, unspecified 05/28/2013  . Dementia ([place])   . Diabetes mellitus   . Hypercholesteremia     Past Surgical History:  Procedure Laterality Date  . ABDOMINAL HYSTERECTOMY    . BOWEL RESECTION    . BREAST REDUCTION SURGERY      Social History   Tobacco Use  . Smoking status: Never Smoker  . Smokeless tobacco: Never Used  Substance Use Topics  . Alcohol use: No  . Drug use: No    Family History  Problem Relation Age of Onset  . Diabetes Sister   . Cancer Daughter     Allergies  Allergen Reactions  . Penicillins Itching    Has  patient had a PCN reaction causing immediate rash, facial/tongue/throat swelling, SOB or lightheadedness with hypotension: Yes Has patient had a PCN reaction causing severe rash involving mucus membranes or skin necrosis: No Has patient had a PCN reaction that required hospitalization No Has patient had a PCN reaction occurring within the last 10 years: No If all of the above answers are "NO", then may proceed with Cephalosporin use.     Medication list has been reviewed and updated.  Current Outpatient Medications on File Prior to Visit  Medication Sig Dispense Refill  . acetaminophen  (TYLENOL) 325 MG tablet Take 2 tablets (650 mg total) by mouth every 6 (six) hours as needed for mild pain (temperature >/= 99.5 F).    Marland Kitchen aspirin 81 MG chewable tablet Chew 1 tablet (81 mg total) by mouth daily.    . Insulin Glargine (LANTUS SOLOSTAR) 100 UNIT/ML Solostar Pen INJECT 12 UNITS UNDER THE SKIN EVERY NIGHT AT BEDTIME (Patient taking differently: Inject 12 Units into the skin at bedtime. ) 15 mL 9  . lactose free nutrition (BOOST PLUS) LIQD Take 237 mLs by mouth daily. (Patient taking differently: Take 237 mLs by mouth daily as needed (as a supplement). )  0  . levothyroxine (SYNTHROID) 25 MCG tablet Take 1 tablet (25 mcg total) by mouth daily before breakfast. 90 tablet 3  . loperamide (IMODIUM) 2 MG capsule Take 1 capsule (2 mg total) by mouth as needed for diarrhea or loose stools. Not to exceed 8 mg/day 30 capsule 0  . QUEtiapine (SEROQUEL) 25 MG tablet Take 1 tablet (25 mg total) by mouth at bedtime. 30 tablet 6   No current facility-administered medications on file prior to visit.     Review of Systems:  As per HPI- otherwise negative. No fever or chills  Physical Examination: There were no vitals filed for this visit. There were no vitals filed for this visit. There is no height or weight on file to calculate BMI. Ideal Body Weight:    History provided by daughter and caregivers today They report the patient is feeling well, at her baseline No apparent pain or distress  Assessment and Plan: Physical exam  Severe dementia (HCC)  Acquired hypothyroidism  Type 2 diabetes mellitus with complication, with long-term current use of insulin (HCC)  Virtual visit today for physical exam Marcia Campbell continues to be at her typical baseline, she has severe dementia and is capable of little physical activity at this time However she seems to be comfortable and in no distress Advised that we can try stopping her lantus at this time- if her glucose goes up significantly can  re-start, but otherwise she likely no longer need to continue lantus as she is on a small amount and her sugars have been very well controlled  Spoke to family on the phone for about 11 minutes today   Signed Abbe Amsterdam, MD

## 2019-02-12 ENCOUNTER — Encounter: Payer: Self-pay | Admitting: Family Medicine

## 2019-02-12 ENCOUNTER — Ambulatory Visit (INDEPENDENT_AMBULATORY_CARE_PROVIDER_SITE_OTHER): Payer: Medicare Other | Admitting: Family Medicine

## 2019-02-12 ENCOUNTER — Other Ambulatory Visit: Payer: Self-pay

## 2019-02-12 DIAGNOSIS — F0390 Unspecified dementia without behavioral disturbance: Secondary | ICD-10-CM

## 2019-02-12 DIAGNOSIS — Z794 Long term (current) use of insulin: Secondary | ICD-10-CM | POA: Diagnosis not present

## 2019-02-12 DIAGNOSIS — E039 Hypothyroidism, unspecified: Secondary | ICD-10-CM

## 2019-02-12 DIAGNOSIS — Z0000 Encounter for general adult medical examination without abnormal findings: Secondary | ICD-10-CM

## 2019-02-12 DIAGNOSIS — E118 Type 2 diabetes mellitus with unspecified complications: Secondary | ICD-10-CM | POA: Diagnosis not present

## 2019-02-12 DIAGNOSIS — F03C0 Unspecified dementia, severe, without behavioral disturbance, psychotic disturbance, mood disturbance, and anxiety: Secondary | ICD-10-CM

## 2019-04-16 ENCOUNTER — Emergency Department (HOSPITAL_COMMUNITY)
Admission: EM | Admit: 2019-04-16 | Discharge: 2019-04-16 | Disposition: A | Discharge status: Home / Self Care | Attending: Emergency Medicine | Admitting: Emergency Medicine

## 2019-04-16 ENCOUNTER — Other Ambulatory Visit: Payer: Self-pay

## 2019-04-16 DIAGNOSIS — E119 Type 2 diabetes mellitus without complications: Secondary | ICD-10-CM | POA: Diagnosis not present

## 2019-04-16 DIAGNOSIS — Z794 Long term (current) use of insulin: Secondary | ICD-10-CM | POA: Diagnosis not present

## 2019-04-16 DIAGNOSIS — F0390 Unspecified dementia without behavioral disturbance: Secondary | ICD-10-CM | POA: Diagnosis not present

## 2019-04-16 DIAGNOSIS — E039 Hypothyroidism, unspecified: Secondary | ICD-10-CM | POA: Insufficient documentation

## 2019-04-16 DIAGNOSIS — R55 Syncope and collapse: Secondary | ICD-10-CM | POA: Insufficient documentation

## 2019-04-16 DIAGNOSIS — R404 Transient alteration of awareness: Secondary | ICD-10-CM | POA: Diagnosis present

## 2019-04-16 DIAGNOSIS — Z79899 Other long term (current) drug therapy: Secondary | ICD-10-CM | POA: Diagnosis not present

## 2019-04-16 LAB — CBC WITH DIFFERENTIAL/PLATELET
Abs Immature Granulocytes: 0.02 10*3/uL (ref 0.00–0.07)
Basophils Absolute: 0 10*3/uL (ref 0.0–0.1)
Basophils Relative: 0 %
Eosinophils Absolute: 0 10*3/uL (ref 0.0–0.5)
Eosinophils Relative: 0 %
HCT: 31.6 % — ABNORMAL LOW (ref 36.0–46.0)
Hemoglobin: 10.2 g/dL — ABNORMAL LOW (ref 12.0–15.0)
Immature Granulocytes: 0 %
Lymphocytes Relative: 14 %
Lymphs Abs: 0.9 10*3/uL (ref 0.7–4.0)
MCH: 31.7 pg (ref 26.0–34.0)
MCHC: 32.3 g/dL (ref 30.0–36.0)
MCV: 98.1 fL (ref 80.0–100.0)
Monocytes Absolute: 0.2 10*3/uL (ref 0.1–1.0)
Monocytes Relative: 4 %
Neutro Abs: 5.1 10*3/uL (ref 1.7–7.7)
Neutrophils Relative %: 82 %
Platelets: 233 10*3/uL (ref 150–400)
RBC: 3.22 MIL/uL — ABNORMAL LOW (ref 3.87–5.11)
RDW: 12.8 % (ref 11.5–15.5)
WBC: 6.2 10*3/uL (ref 4.0–10.5)
nRBC: 0 % (ref 0.0–0.2)

## 2019-04-16 LAB — COMPREHENSIVE METABOLIC PANEL WITH GFR
ALT: 14 U/L (ref 0–44)
AST: 15 U/L (ref 15–41)
Albumin: 3.1 g/dL — ABNORMAL LOW (ref 3.5–5.0)
Alkaline Phosphatase: 43 U/L (ref 38–126)
Anion gap: 10 (ref 5–15)
BUN: 16 mg/dL (ref 8–23)
CO2: 24 mmol/L (ref 22–32)
Calcium: 9.2 mg/dL (ref 8.9–10.3)
Chloride: 109 mmol/L (ref 98–111)
Creatinine, Ser: 0.9 mg/dL (ref 0.44–1.00)
GFR calc Af Amer: >60 mL/min
GFR calc non Af Amer: 54 mL/min — ABNORMAL LOW
Glucose, Bld: 217 mg/dL — ABNORMAL HIGH (ref 70–99)
Potassium: 4.6 mmol/L (ref 3.5–5.1)
Sodium: 143 mmol/L (ref 135–145)
Total Bilirubin: 0.9 mg/dL (ref 0.3–1.2)
Total Protein: 6 g/dL — ABNORMAL LOW (ref 6.5–8.1)

## 2019-04-16 LAB — TSH: TSH: 4.046 u[IU]/mL (ref 0.350–4.500)

## 2019-04-16 MED ORDER — SODIUM CHLORIDE 0.9 % IV BOLUS
500.0000 mL | Freq: Once | INTRAVENOUS | Status: AC
  Administered 2019-04-16: 500 mL via INTRAVENOUS

## 2019-04-16 NOTE — ED Provider Notes (Signed)
[facility] EMERGENCY DEPARTMENT Provider Note   CSN: [id number] Arrival date & time: 04/16/19  1334     History Chief Complaint  Patient presents with  . Near Syncope    Marcia Campbell is a 86 y.o. female. Level 5 caveat due to dementia. HPI Patient presents with unresponsive episode.  Reportedly was minimally responsive and was found by caregivers.  911 was called.  Had episodes previously of same due to hypo thyroidism.  Now somewhat improved mental status.  However patient does appear to be on hospice.  Patient's daughter states however that patient would want stuff done and would want to come into the hospital.  Patient really cannot provide any history.  I discussed with hospice nurse and they had not been contacted.    Past Medical History:  Diagnosis Date  . Anemia, unspecified 05/28/2013  . Dementia ([name])   . Diabetes mellitus   . Hypercholesteremia     Patient Active Problem List   Diagnosis Date Noted  . Acute metabolic encephalopathy 81/03/7508  . AMS (altered mental status) 07/12/2018  . Hypothyroidism 07/12/2018  . Leg weakness   . Rigidity   . Weakness 08/27/2014  . Anemia, unspecified 05/28/2013  . Thrombocytopenia, unspecified ([place]) 05/28/2013  . Moderate dementia with behavioral disturbance ([place]) 12/12/2012  . 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 Glaucoma 07/25/2011  . Positive PPD, treated 07/25/2011  . Diverticulosis 07/25/2011  . Anemia, iron deficiency 07/25/2011  . Small bowel obstruction ([place]) 07/25/2011  . DM (diabetes mellitus) ([place]) 07/24/2011  . Hyperlipemia 07/24/2011    Past Surgical History:  Procedure Laterality Date  . ABDOMINAL HYSTERECTOMY    . BOWEL RESECTION    . BREAST REDUCTION SURGERY       OB History   No obstetric history on file.     Family History  Problem Relation Age of Onset  . Diabetes Sister   . Cancer Daughter     Social History   Tobacco Use  . Smoking status: Never Smoker  . Smokeless  tobacco: Never Used  Substance Use Topics  . Alcohol use: No  . Drug use: No    Home Medications Prior to Admission medications   Medication Sig Start Date End Date Taking? Authorizing Provider  acetaminophen (TYLENOL) 325 MG tablet Take 2 tablets (650 mg total) by mouth every 6 (six) hours as needed for mild pain (temperature >/= 99.5 F). 08/30/14   Karamalegos, Devonne Doughty, DO  aspirin 81 MG chewable tablet Chew 1 tablet (81 mg total) by mouth daily. 08/30/14   Karamalegos, Devonne Doughty, DO  Insulin Glargine (LANTUS SOLOSTAR) 100 UNIT/ML Solostar Pen INJECT 12 UNITS UNDER THE SKIN EVERY NIGHT AT BEDTIME Patient taking differently: Inject 12 Units into the skin at bedtime.  05/22/18   Copland, Gay Filler, MD  lactose free nutrition (BOOST PLUS) LIQD Take 237 mLs by mouth daily. Patient taking differently: Take 237 mLs by mouth daily as needed (as a supplement).  08/30/14   Karamalegos, Devonne Doughty, DO  levothyroxine (SYNTHROID) 25 MCG tablet Take 1 tablet (25 mcg total) by mouth daily before breakfast. 08/28/18   Copland, Gay Filler, MD  loperamide (IMODIUM) 2 MG capsule Take 1 capsule (2 mg total) by mouth as needed for diarrhea or loose stools. Not to exceed 8 mg/day 06/29/14   Ezekiel Slocumb, PA-C    Allergies    Penicillins  Review of Systems   Review of Systems  Unable to perform ROS: Dementia    Physical  Exam Updated Vital Signs BP (!) 116/48 (BP Location: Right Arm)   Pulse (!) 52   Temp (S) (!) 97.4 F (36.3 C) (Axillary)   Resp 16   SpO2 100%   Physical Exam Vitals and nursing note reviewed.  HENT:     Head: Normocephalic.  Eyes:     General: No scleral icterus. Cardiovascular:     Rate and Rhythm: Regular rhythm.  Pulmonary:     Breath sounds: No wheezing or rhonchi.  Abdominal:     Tenderness: There is no abdominal tenderness.  Skin:    General: Skin is warm.  Neurological:     Mental Status: She is alert.     Comments: Nonverbal for me.  Minimally responsive.   Will not follow commands.     ED Results / Procedures / Treatments   Labs (all labs ordered are listed, but only abnormal results are displayed) Labs Reviewed  COMPREHENSIVE METABOLIC PANEL  TSH  CBC WITH DIFFERENTIAL/PLATELET    EKG EKG Interpretation  Date/Time:  Wednesday April 16 2019 15:04:32 EST Ventricular Rate:  127 PR Interval:    QRS Duration: 87 QT Interval:  391 QTC Calculation: 539 R Axis:   -27 Text Interpretation: sinus brady with artifact Confirmed by Benjiman Core [phone]) on 04/16/2019 3:29:43 PM   Radiology No results found.  Procedures Procedures (including critical care time)  Medications Ordered in ED Medications  sodium chloride 0.9 % bolus 500 mL (has no administration in time range)    ED Course  I have reviewed the triage vital signs and the nursing notes.  Pertinent labs & imaging results that were available during my care of the patient were reviewed by me and considered in my medical decision making (see chart for details).    MDM Rules/Calculators/A&P                      Patient presents with unresponsive episode.  Patient is on hospice.  However daughter and staff are patient coming to be evaluated.  Patient appears to be close to her baseline now.  Daughter thinks may be dehydrated.  Discussed with hospice and they will follow as needed.  Labs pending.  Care will be turned over to Dr. Clarice Pole. Final Clinical Impression(s) / ED Diagnoses Final diagnoses:  None    Rx / DC Orders ED Discharge Orders    None       Benjiman Core, MD 04/16/19 1530

## 2019-04-16 NOTE — Progress Notes (Signed)
[facility]  Patient is active with [facility] for hospice services in the home. Appreciate ED physician making [facility] nurse Lillia Abed aware of patient's admission to ED. Notes reviewed and note plan for fluids and labs. Left message with daughter. [facility] liaison will follow up in am. Please contact after hours service  with questions overnight at [phone].  Thank you,  Forrestine Him, LCSW [phone] [facility] liaisons are listed daily on AMION under [facility]

## 2019-04-16 NOTE — ED Notes (Signed)
Help get patient undress into a gown on the monitor patient is resting with nurse at bedside and call bell in reach 

## 2019-04-16 NOTE — ED Notes (Signed)
Pt's daughter called. Ready to receive pt. Pt leaving via ptar

## 2019-04-16 NOTE — ED Provider Notes (Addendum)
Getting fluids and labs.  Follow-up on results.  Possible discharge home if stable.  Patient is hospice patient. Physical Exam  BP (!) 116/48 (BP Location: Right Arm)   Pulse (!) 52   Temp (S) (!) 97.4 F (36.3 C) (Axillary)   Resp 16   SpO2 100%   Physical Exam  ED Course/Procedures     Procedures  MDM  Patient is alert and asking to eat and drink.  Her vital signs are stable.  Diagnostic results are within baseline normal limits for the patient.  Stable for discharge at this point time.  She has good home care arranged.  Patient's daughter is with her.       Arby Barrette, MD 04/16/19 1554    Arby Barrette, MD 04/16/19 [phone]

## 2019-04-16 NOTE — ED Notes (Signed)
Called ptar for pt transport  

## 2019-04-16 NOTE — ED Notes (Signed)
Pt's daughter at bedside- pt meets ALONE criteria

## 2019-04-16 NOTE — ED Triage Notes (Signed)
To ED via [facility] medic [facility] from home-- PT IS A HOSPICE PT UNDER [facility] [phone]-- Hospice nurse was at the house- called 911 for cardiac arrest/unresponsive.  On arrival pt is responsive to verbal stimuli, will raise eyebrows, squeeze hands on command. Received 500cc NS enroute

## 2019-07-23 ENCOUNTER — Telehealth: Payer: Self-pay | Admitting: Family Medicine

## 2019-07-23 NOTE — Telephone Encounter (Signed)
Caller: [name] Call back phone number:[phone]  [name] is the patient daughter calling to see if Dr. Patsy Lager would send a yeast topical ointment for her left hand to the pharmacy.   Patient prefer pharmacy is [id number] [place], [place] - [address] AT [facility] OF [facility] & [address]  [address] [place], [place] [address]  Phone:  [phone] Fax:  [phone]  Please advise.

## 2019-07-23 NOTE — Telephone Encounter (Signed)
Sent patients daughter message on mychart. Needs appointment.

## 2019-09-26 ENCOUNTER — Telehealth: Payer: Self-pay | Admitting: Family Medicine

## 2019-09-26 MED ORDER — LEVOTHYROXINE SODIUM 25 MCG PO TABS: 25.0000 ug | ORAL_TABLET | Freq: Every day | ORAL | 0 refills | Status: DC

## 2019-09-26 NOTE — Telephone Encounter (Signed)
Medication was sent to pharmacy for patient

## 2019-09-26 NOTE — Telephone Encounter (Signed)
Medication: levothyroxine (SYNTHROID) 25 MCG tablet [[id number]]       Has the patient contacted their pharmacy?  (If no, request that the patient contact the pharmacy for the refill.) (If yes, when and what did the pharmacy advise?)     Preferred Pharmacy (with phone number or street name): [facility] #[id number] [place], [place] - [address] AT [facility] OF [facility] & [address]  [address] [place], [place] [address]  Phone:  [phone] Fax:  [phone]      Agent: Please be advised that RX refills may take up to 3 business days. We ask that you follow-up with your pharmacy.

## 2020-01-01 ENCOUNTER — Other Ambulatory Visit: Payer: Self-pay | Admitting: Family Medicine

## 2020-01-02 ENCOUNTER — Emergency Department (HOSPITAL_COMMUNITY)

## 2020-01-02 ENCOUNTER — Other Ambulatory Visit: Payer: Self-pay

## 2020-01-02 ENCOUNTER — Emergency Department (HOSPITAL_COMMUNITY)
Admission: EM | Admit: 2020-01-02 | Discharge: 2020-01-02 | Disposition: A | Discharge status: Home / Self Care | Attending: Emergency Medicine | Admitting: Emergency Medicine

## 2020-01-02 DIAGNOSIS — R4182 Altered mental status, unspecified: Secondary | ICD-10-CM | POA: Insufficient documentation

## 2020-01-02 DIAGNOSIS — F0390 Unspecified dementia without behavioral disturbance: Secondary | ICD-10-CM | POA: Insufficient documentation

## 2020-01-02 DIAGNOSIS — E039 Hypothyroidism, unspecified: Secondary | ICD-10-CM | POA: Insufficient documentation

## 2020-01-02 DIAGNOSIS — I959 Hypotension, unspecified: Secondary | ICD-10-CM | POA: Insufficient documentation

## 2020-01-02 DIAGNOSIS — Z79899 Other long term (current) drug therapy: Secondary | ICD-10-CM | POA: Insufficient documentation

## 2020-01-02 DIAGNOSIS — R001 Bradycardia, unspecified: Secondary | ICD-10-CM | POA: Insufficient documentation

## 2020-01-02 DIAGNOSIS — E119 Type 2 diabetes mellitus without complications: Secondary | ICD-10-CM | POA: Diagnosis not present

## 2020-01-02 DIAGNOSIS — R55 Syncope and collapse: Secondary | ICD-10-CM | POA: Insufficient documentation

## 2020-01-02 DIAGNOSIS — Z7982 Long term (current) use of aspirin: Secondary | ICD-10-CM | POA: Insufficient documentation

## 2020-01-02 LAB — COMPREHENSIVE METABOLIC PANEL
ALT: 18 U/L (ref 0–44)
AST: 24 U/L (ref 15–41)
Albumin: 3.1 g/dL — ABNORMAL LOW (ref 3.5–5.0)
Alkaline Phosphatase: 45 U/L (ref 38–126)
Anion gap: 7 (ref 5–15)
BUN: 26 mg/dL — ABNORMAL HIGH (ref 8–23)
CO2: 21 mmol/L — ABNORMAL LOW (ref 22–32)
Calcium: 8.7 mg/dL — ABNORMAL LOW (ref 8.9–10.3)
Chloride: 112 mmol/L — ABNORMAL HIGH (ref 98–111)
Creatinine, Ser: 0.91 mg/dL (ref 0.44–1.00)
GFR, Estimated: 58 mL/min — ABNORMAL LOW (ref >60)
Glucose, Bld: 295 mg/dL — ABNORMAL HIGH (ref 70–99)
Potassium: 4.3 mmol/L (ref 3.5–5.1)
Sodium: 140 mmol/L (ref 135–145)
Total Bilirubin: 0.9 mg/dL (ref 0.3–1.2)
Total Protein: 5.6 g/dL — ABNORMAL LOW (ref 6.5–8.1)

## 2020-01-02 LAB — MAGNESIUM: Magnesium: 1.8 mg/dL (ref 1.7–2.4)

## 2020-01-02 LAB — URINALYSIS, ROUTINE W REFLEX MICROSCOPIC
Bilirubin Urine: NEGATIVE
Glucose, UA: NEGATIVE mg/dL
Hgb urine dipstick: NEGATIVE
Ketones, ur: NEGATIVE mg/dL
Leukocytes,Ua: NEGATIVE
Nitrite: NEGATIVE
Protein, ur: NEGATIVE mg/dL
Specific Gravity, Urine: 1.02 (ref 1.005–1.030)
pH: 6 (ref 5.0–8.0)

## 2020-01-02 LAB — I-STAT VENOUS BLOOD GAS, ED
Acid-Base Excess: 0 mmol/L (ref 0.0–2.0)
Bicarbonate: 21.7 mmol/L (ref 20.0–28.0)
Calcium, Ion: 1.09 mmol/L — ABNORMAL LOW (ref 1.15–1.40)
HCT: 26 % — ABNORMAL LOW (ref 36.0–46.0)
Hemoglobin: 8.8 g/dL — ABNORMAL LOW (ref 12.0–15.0)
O2 Saturation: 100 %
Potassium: 4.2 mmol/L (ref 3.5–5.1)
Sodium: 142 mmol/L (ref 135–145)
TCO2: 22 mmol/L (ref 22–32)
pCO2, Ven: 24 mmHg — ABNORMAL LOW (ref 44.0–60.0)
pH, Ven: 7.565 — ABNORMAL HIGH (ref 7.250–7.430)
pO2, Ven: 162 mmHg — ABNORMAL HIGH (ref 32.0–45.0)

## 2020-01-02 LAB — LACTIC ACID, PLASMA: Lactic Acid, Venous: 1.5 mmol/L (ref 0.5–1.9)

## 2020-01-02 LAB — TSH: TSH: 7.684 u[IU]/mL — ABNORMAL HIGH (ref 0.350–4.500)

## 2020-01-02 LAB — LIPASE, BLOOD: Lipase: 46 U/L (ref 11–51)

## 2020-01-02 LAB — CBG MONITORING, ED: Glucose-Capillary: 241 mg/dL — ABNORMAL HIGH (ref 70–99)

## 2020-01-02 LAB — TROPONIN I (HIGH SENSITIVITY)
Troponin I (High Sensitivity): <2 ng/L (ref <18)
Troponin I (High Sensitivity): <2 ng/L (ref <18)

## 2020-01-02 LAB — BETA-HYDROXYBUTYRIC ACID: Beta-Hydroxybutyric Acid: 0.18 mmol/L (ref 0.05–0.27)

## 2020-01-02 MED ORDER — SODIUM CHLORIDE 0.9 % IV BOLUS
1000.0000 mL | Freq: Once | INTRAVENOUS | Status: AC
  Administered 2020-01-02: 1000 mL via INTRAVENOUS

## 2020-01-02 NOTE — Progress Notes (Signed)
Civil engineer, contracting [facility]) liaison note  At time of discharge please use [facility] .  [facility] contracts with them.  Thank you for caring for this pt.  Gillian Scarce, BSN, RN [facility] Liaison [phone] (24h on call)

## 2020-01-02 NOTE — ED Triage Notes (Signed)
Pt was at brunch with family and had a syncopal episode. Pt became unresponsive and family called 911. On EMS arrival, pt was alert to verbal stimuli only. EMS reported pt was discharged as a hospice pt yesterday 10/28 due to living past 6 months.   EMS vitals  BP- 100/50 HR- 60 02 RA- 95% CBG- 309' RR- 15

## 2020-01-02 NOTE — ED Provider Notes (Signed)
[facility] EMERGENCY DEPARTMENT Provider Note   CSN: [id number] Arrival date & time: 01/02/20  1309     History Chief Complaint  Patient presents with  . Altered Mental Status    Marcia Campbell is a 86 y.o. female.  The history is provided by the patient and medical records. No language interpreter was used.  Loss of Consciousness Episode history:  Single Most recent episode:  Today Timing:  Constant Progression:  Unchanged Chronicity:  New Witnessed: yes   Relieved by:  Nothing Worsened by:  Nothing Ineffective treatments:  None tried  LVL 5 caveat for unresponsiveness and AMS      Past Medical History:  Diagnosis Date  . Anemia, unspecified 05/28/2013  . Dementia (HCC)   . Diabetes mellitus   . Hypercholesteremia     Patient Active Problem List   Diagnosis Date Noted  . Acute metabolic encephalopathy 07/17/2018  . AMS (altered mental status) 07/12/2018  . Hypothyroidism 07/12/2018  . Leg weakness   . Rigidity   . Weakness 08/27/2014  . Anemia, unspecified 05/28/2013  . Thrombocytopenia, unspecified (HCC) 05/28/2013  . Moderate dementia with behavioral disturbance (HCC) 12/12/2012  . 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 Glaucoma 07/25/2011  . Positive PPD, treated 07/25/2011  . Diverticulosis 07/25/2011  . Anemia, iron deficiency 07/25/2011  . Small bowel obstruction (HCC) 07/25/2011  . DM (diabetes mellitus) (HCC) 07/24/2011  . Hyperlipemia 07/24/2011    Past Surgical History:  Procedure Laterality Date  . ABDOMINAL HYSTERECTOMY    . BOWEL RESECTION    . BREAST REDUCTION SURGERY       OB History   No obstetric history on file.     Family History  Problem Relation Age of Onset  . Diabetes Sister   . Cancer Daughter     Social History   Tobacco Use  . Smoking status: Never Smoker  . Smokeless tobacco: Never Used  Substance Use Topics  . Alcohol use: No  . Drug use: No    Home Medications Prior to Admission  medications   Medication Sig Start Date End Date Taking? Authorizing Provider  acetaminophen (TYLENOL) 325 MG tablet Take 2 tablets (650 mg total) by mouth every 6 (six) hours as needed for mild pain (temperature >/= 99.5 F). 08/30/14   Karamalegos, Netta Neat, DO  aspirin 81 MG chewable tablet Chew 1 tablet (81 mg total) by mouth daily. 08/30/14   Karamalegos, Netta Neat, DO  Insulin Glargine (LANTUS SOLOSTAR) 100 UNIT/ML Solostar Pen INJECT 12 UNITS UNDER THE SKIN EVERY NIGHT AT BEDTIME Patient not taking: Reported on 04/16/2019 05/22/18   Copland, Gwenlyn Found, MD  lactose free nutrition (BOOST PLUS) LIQD Take 237 mLs by mouth daily. Patient not taking: Reported on 04/16/2019 08/30/14   Smitty Cords, DO  levothyroxine (SYNTHROID) 25 MCG tablet TAKE 1 TABLET(25 MCG) BY MOUTH DAILY BEFORE BREAKFAST 01/01/20   Copland, Gwenlyn Found, MD  loperamide (IMODIUM) 2 MG capsule Take 1 capsule (2 mg total) by mouth as needed for diarrhea or loose stools. Not to exceed 8 mg/day 06/29/14   Dorna Leitz, PA-C  sennosides-docusate sodium (SENOKOT-S) 8.6-50 MG tablet Take 1 tablet by mouth at bedtime.    [provider]  sorbitol 70 % SOLN Take 30 mLs by mouth at bedtime as needed (for constipation).  12/02/18   [provider]    Allergies    Penicillins  Review of Systems   Review of Systems  Unable to perform ROS: Patient unresponsive  Cardiovascular: Positive for syncope.    Physical Exam Updated Vital Signs BP (!) 97/43 (BP Location: Right Arm)   Pulse 65   Temp (!) 96.6 F (35.9 C) (Rectal)   Resp 18   SpO2 100%   Physical Exam Vitals and nursing note reviewed.  Constitutional:      General: She is not in acute distress.    Appearance: She is well-developed. She is ill-appearing. She is not toxic-appearing or diaphoretic.  HENT:     Head: Normocephalic and atraumatic.     Right Ear: External ear normal.     Left Ear: External ear normal.     Nose: Nose normal. No  rhinorrhea.     Mouth/Throat:     Mouth: Mucous membranes are dry.     Pharynx: No oropharyngeal exudate or posterior oropharyngeal erythema.  Eyes:     Conjunctiva/sclera: Conjunctivae normal.     Pupils: Pupils are equal, round, and reactive to light.  Cardiovascular:     Rate and Rhythm: Regular rhythm. Bradycardia present.     Pulses: Normal pulses.     Heart sounds: No murmur heard.   Pulmonary:     Effort: Pulmonary effort is normal. No respiratory distress.     Breath sounds: No stridor. Rhonchi present. No wheezing or rales.  Chest:     Chest wall: No tenderness.  Abdominal:     General: Abdomen is flat. There is no distension.     Tenderness: There is no abdominal tenderness. There is no right CVA tenderness, left CVA tenderness, guarding or rebound.  Musculoskeletal:        General: No tenderness.     Cervical back: Normal range of motion and neck supple. No tenderness.  Skin:    General: Skin is warm.     Capillary Refill: Capillary refill takes less than 2 seconds.     Coloration: Skin is pale.     Findings: No erythema or rash.  Neurological:     Mental Status: She is unresponsive.     GCS: GCS eye subscore is 4. GCS verbal subscore is 1. GCS motor subscore is 4.     Motor: No abnormal muscle tone.     Deep Tendon Reflexes: Reflexes are normal and symmetric.     ED Results / Procedures / Treatments   Labs (all labs ordered are listed, but only abnormal results are displayed) Labs Reviewed  CBG MONITORING, ED - Abnormal; Notable for the following components:      Result Value   Glucose-Capillary 241 (*)    All other components within normal limits  I-STAT VENOUS BLOOD GAS, ED - Abnormal; Notable for the following components:   pH, Ven 7.565 (*)    pCO2, Ven 24.0 (*)    pO2, Ven 162.0 (*)    Calcium, Ion 1.09 (*)    HCT 26.0 (*)    Hemoglobin 8.8 (*)    All other components within normal limits  RESPIRATORY PANEL BY RT PCR (FLU A&B, COVID)  CULTURE,  BLOOD (ROUTINE X 2)  CULTURE, BLOOD (ROUTINE X 2)  URINE CULTURE  LACTIC ACID, PLASMA  URINALYSIS, ROUTINE W REFLEX MICROSCOPIC  BETA-HYDROXYBUTYRIC ACID  CBC WITH DIFFERENTIAL/PLATELET  COMPREHENSIVE METABOLIC PANEL  LACTIC ACID, PLASMA  LIPASE, BLOOD  TSH  MAGNESIUM  BLOOD GAS, VENOUS  TROPONIN I (HIGH SENSITIVITY)  TROPONIN I (HIGH SENSITIVITY)    EKG EKG Interpretation  Date/Time:  Friday January 02 2020 13:31:48 EDT Ventricular Rate:  69 PR Interval:  QRS Duration: 100 QT Interval:  409 QTC Calculation: 439 R Axis:   -56 Text Interpretation: Sinus rhythm Left anterior fascicular block Low voltage, precordial leads Abnormal R-wave progression, late transition Nonspecific T abnrm, anterolateral leads When compared to prior, similar apperance. No STEMI Confirmed by Theda Belfast ([id number]) on 01/02/2020 1:49:25 PM   Radiology CT Head Wo Contrast  Result Date: 01/02/2020 CLINICAL DATA:  Mental status change, unknown cause; altered mental status. Hypotension. EXAM: CT HEAD WITHOUT CONTRAST TECHNIQUE: Contiguous axial images were obtained from the base of the skull through the vertex without intravenous contrast. COMPARISON:  Brain MRI 07/12/2018. FINDINGS: Brain: Moderate to moderately advanced cerebral atrophy. Moderate ill-defined hypoattenuation within the cerebral white matter is nonspecific, but compatible with chronic small vessel ischemic disease. There is no acute intracranial hemorrhage. No demarcated cortical infarct. No extra-axial fluid collection. No evidence of intracranial mass. No midline shift. Vascular: No hyperdense vessel.  Atherosclerotic calcifications. Skull: Normal. Negative for fracture or focal lesion. Sinuses/Orbits: Visualized orbits show no acute finding. Trace scattered paranasal sinus mucosal thickening at the imaged levels. No significant mastoid effusion. IMPRESSION: No evidence of acute intracranial abnormality. Redemonstrated moderate to moderately  advanced cerebral atrophy and moderate chronic small vessel ischemic disease. Electronically Signed   By: Jackey Loge DO   On: 01/02/2020 14:31   DG Chest Portable 1 View  Result Date: 01/02/2020 CLINICAL DATA:  Syncope EXAM: PORTABLE CHEST 1 VIEW COMPARISON:  Jul 12, 2018 FINDINGS: Lungs are clear. Heart size and pulmonary vascularity are within normal limits. No adenopathy. No bone lesions. There is stable elevation of the right hemidiaphragm. There is aortic atherosclerosis. IMPRESSION: Lungs clear.  Cardiac silhouette normal. Aortic Atherosclerosis (ICD10-I70.0). Electronically Signed   By: Bretta Bang III M.D.   On: 01/02/2020 14:07    Procedures Procedures (including critical care time)  Medications Ordered in ED Medications  sodium chloride 0.9 % bolus 1,000 mL (1,000 mLs Intravenous New Bag/Given 01/02/20 1422)    ED Course  I have reviewed the triage vital signs and the nursing notes.  Pertinent labs & imaging results that were available during my care of the patient were reviewed by me and considered in my medical decision making (see chart for details).    MDM Rules/Calculators/A&P                          Lekita ROSEA DORY is a 86 y.o. female with a past medical history significant for dementia, diabetes, hyperlipidemia, prior SBO, hypothyroidism, and prior metabolic encephalopathy who presents with syncopal episode and unresponsiveness.  According to EMS, patient was on hospice but graduated from yesterday and is no longer on hospice.  She was eating brunch with family today and did not choke but had a syncopal episode.  She slumped over and was found to be unresponsive.  Patient is on responding to some painful stimuli but is moving all extremities to pain.  She was brought in with no significant change in mental status.  She was found to be hyperglycemic in route.  On arrival, airway appears to be intact.  She is DNR/DNI so we will not intubate.  Patient is moving  extremities with some pain but is not responding to any speech.  Pupils are symmetric and reactive.  No apparent tenderness on exam.  Lungs were slightly coarse on my auscultation.  No evidence of acute trauma.  She did not hit her head according to EMS.  Clinically I am concerned  about what could be causing the patient's hypotension and altered mental status with syncope.  Patient will have CT head as well as work-up for occult infection.  She will get some fluids for the hypotension and labs.  We will rule out DKA given the elevated glucose with EMS.  Patient was found to have no new acute abnormality on her head CT.  Lactic acid not elevated, doubt sepsis at this time.  Patient was a difficult stick and it took a while to get blood work started.  Care transferred to oncoming team while awaiting for results of diagnostic work-up to determine etiology of syncope and unresponsiveness and altered mental status.  Anticipate disposition based on work-up results and discussion with family who had not yet arrived during my initial care of the patient.   Final Clinical Impression(s) / ED Diagnoses Final diagnoses:  Altered mental status, unspecified altered mental status type  Syncope, unspecified syncope type  Hypotension, unspecified hypotension type     Clinical Impression: 1. Altered mental status, unspecified altered mental status type   2. Syncope, unspecified syncope type   3. Hypotension, unspecified hypotension type     Disposition: Care transferred to oncoming team awaiting for results of work-up and reassessment to determine disposition.  This note was prepared with assistance of Conservation officer, historic buildings. Occasional wrong-word or sound-a-like substitutions may have occurred due to the inherent limitations of voice recognition software.     Seydou Hearns, Canary Brim, MD 01/02/20 1622

## 2020-01-02 NOTE — Progress Notes (Signed)
Patent examiner [facility])  Hospital Liaison: RN note         This patient is a current active hospice patient with [facility]. Hospital liaison will follow if patient is admitted. If patient discharges home the hospital liaison will have home care team follow up.        Please call with any hospice related questions.         Thank you for this referral.         Elsie Saas, RN, CCM  [facility] Liaison (listed on [facility] under Hospice[facility])    [phone]

## 2020-01-02 NOTE — Discharge Instructions (Addendum)
Continue medications as previously prescribed.  Someone from hospice should reach out to you in the near future to discuss Marcia Campbell's hospice status.  Return to the emergency department in the meantime for any new and/or concerning symptoms.

## 2020-01-02 NOTE — ED Provider Notes (Signed)
Care assumed from Dr. Rush Landmark at shift change.  This patient is a 86 year old female recently on hospice and DNR/DNI status.  She is brought today for evaluation of altered mental status.  She was apparently having brunch with her daughter when she became somnolent, then unresponsive.  She was initially found to be hypotensive, however blood pressure seems to have improved with fluids.  Extensive work-up performed this afternoon including laboratory studies, urinalysis, EKG, venous blood gas.  All of these tests have come back basically unremarkable.  Her respiratory panel is negative for Covid and flu.  Patient has been observed for several hours in the ER, during which time she has become more alert and closer to her baseline.  I had extensive discussion with the daughter at bedside.  I see no indication for admission.  We are both in agreement that the patient would be best served being kept comfortable and would be most comfortable at home.  Daughter did express some concerns that she has been discharged from hospice care, however it was brought to my attention that she is still under hospice.  I have messaged Chrislyn Brooke Dare to have hospice reach out to the daughter in the near future as she would like her to be reenrolled.   Geoffery Lyons, MD 01/02/20 1818

## 2020-01-02 NOTE — ED Notes (Signed)
Rn cleaned pt up and is clean and dry. Pt has a diaper on.

## 2020-01-03 LAB — URINE CULTURE: Culture: NO GROWTH

## 2020-01-07 LAB — CULTURE, BLOOD (ROUTINE X 2)
Culture: NO GROWTH
Culture: NO GROWTH

## 2020-04-06 ENCOUNTER — Other Ambulatory Visit: Payer: Self-pay | Admitting: Family Medicine

## 2020-06-14 ENCOUNTER — Telehealth: Payer: Self-pay | Admitting: Family Medicine

## 2020-06-14 NOTE — Telephone Encounter (Signed)
Caller: Makayla-RN [facility]) Call back # [phone]  Verbal order for medication  States patient blood sugar has been on "500", EMS was called over the weekend to access the patient. They are looking for a prescription for immediate use.

## 2020-06-14 NOTE — Telephone Encounter (Signed)
Called and spoke with her daughter [name]. Pt had a "near syncope" episode yesterday.  EMS came and noted that her glucose was really high. This am Rayel seemed to be having some jerking movements, looked like a possible seizure to [place].  She called hospice to check out her mom.  Her BG was over 500 today They had previously stopped her lantus.  Our goals remain pt comfort- she is on hospice and 87 yo Called and spoke with nurse Makayla Start lantus at 10 units - check fasting glucose, as long as over 200 ok to increase lantus by 2u every 2-3 days up to 20 units

## 2020-06-15 ENCOUNTER — Telehealth: Payer: Self-pay | Admitting: Family Medicine

## 2020-06-15 NOTE — Telephone Encounter (Signed)
Spoke to nurse. She was in contact with the patients daughter this morning. The daughter relayed that she ad 4 seizures this morning. First at 2 am, second at 3:18 am, third at 5:27 am, and fourth at 7:55 am. She states they did not last very long however her face "showed discomfort" during the seizures. She did follow plan from yesterday that you advised. This morning 10 units of lantus was given however her blood sugar is so high it is not reading on the glucometer this morning. I did advise the nurse the patient really should go to the ER-but she was not sure how much could/would be done as this patient is demented and on hospice.   Please advise on further instructions.

## 2020-06-15 NOTE — Telephone Encounter (Signed)
Caller: Lillia Abed RN [facility]) Call back # [phone]  Nurse spoke to patient daughter, which informed her patient's blood sugar is so high the glucometer won't read it also patient had 4 seizures through the night.

## 2020-06-15 NOTE — Telephone Encounter (Signed)
Caller : Patient daughter [name] Call Back @ [phone]  Patient daughter returning a call to Dr. Patsy Lager.

## 2020-06-15 NOTE — Telephone Encounter (Signed)
I called and spoke with Marcia Campbell.  Her mom has had a few possible seizures and her glucose is too high to read.  I explained that if we wish to determine why this is happening she likely would need the ER and possible admission.  However given her heath status, age and being on hospice both [place] and I agree this is not in her best interests.   I explained to [place] that her mother is likely in the process of dying.  She wishes to give her mom 10-12 units of lantus daily which is fine and should not harm her. She will keep me posted and be in touch if I can help in any way.

## 2020-06-21 ENCOUNTER — Other Ambulatory Visit: Payer: Self-pay | Admitting: Family Medicine

## 2020-06-21 ENCOUNTER — Encounter: Payer: Self-pay | Admitting: Family Medicine

## 2020-06-21 NOTE — Progress Notes (Signed)
I received a message that the patient has passed away.  I contact her daughter requesting approximate time of death for certification

## 2020-07-04 DEATH — deceased

## 2020-12-11 IMAGING — DX DG CHEST 1V PORT
1 series · 1 of 1 positions shown · non-contrast
Comparison: July 12, 2018

CLINICAL DATA: Syncope

EXAM:
PORTABLE CHEST 1 VIEW

[chest ap]
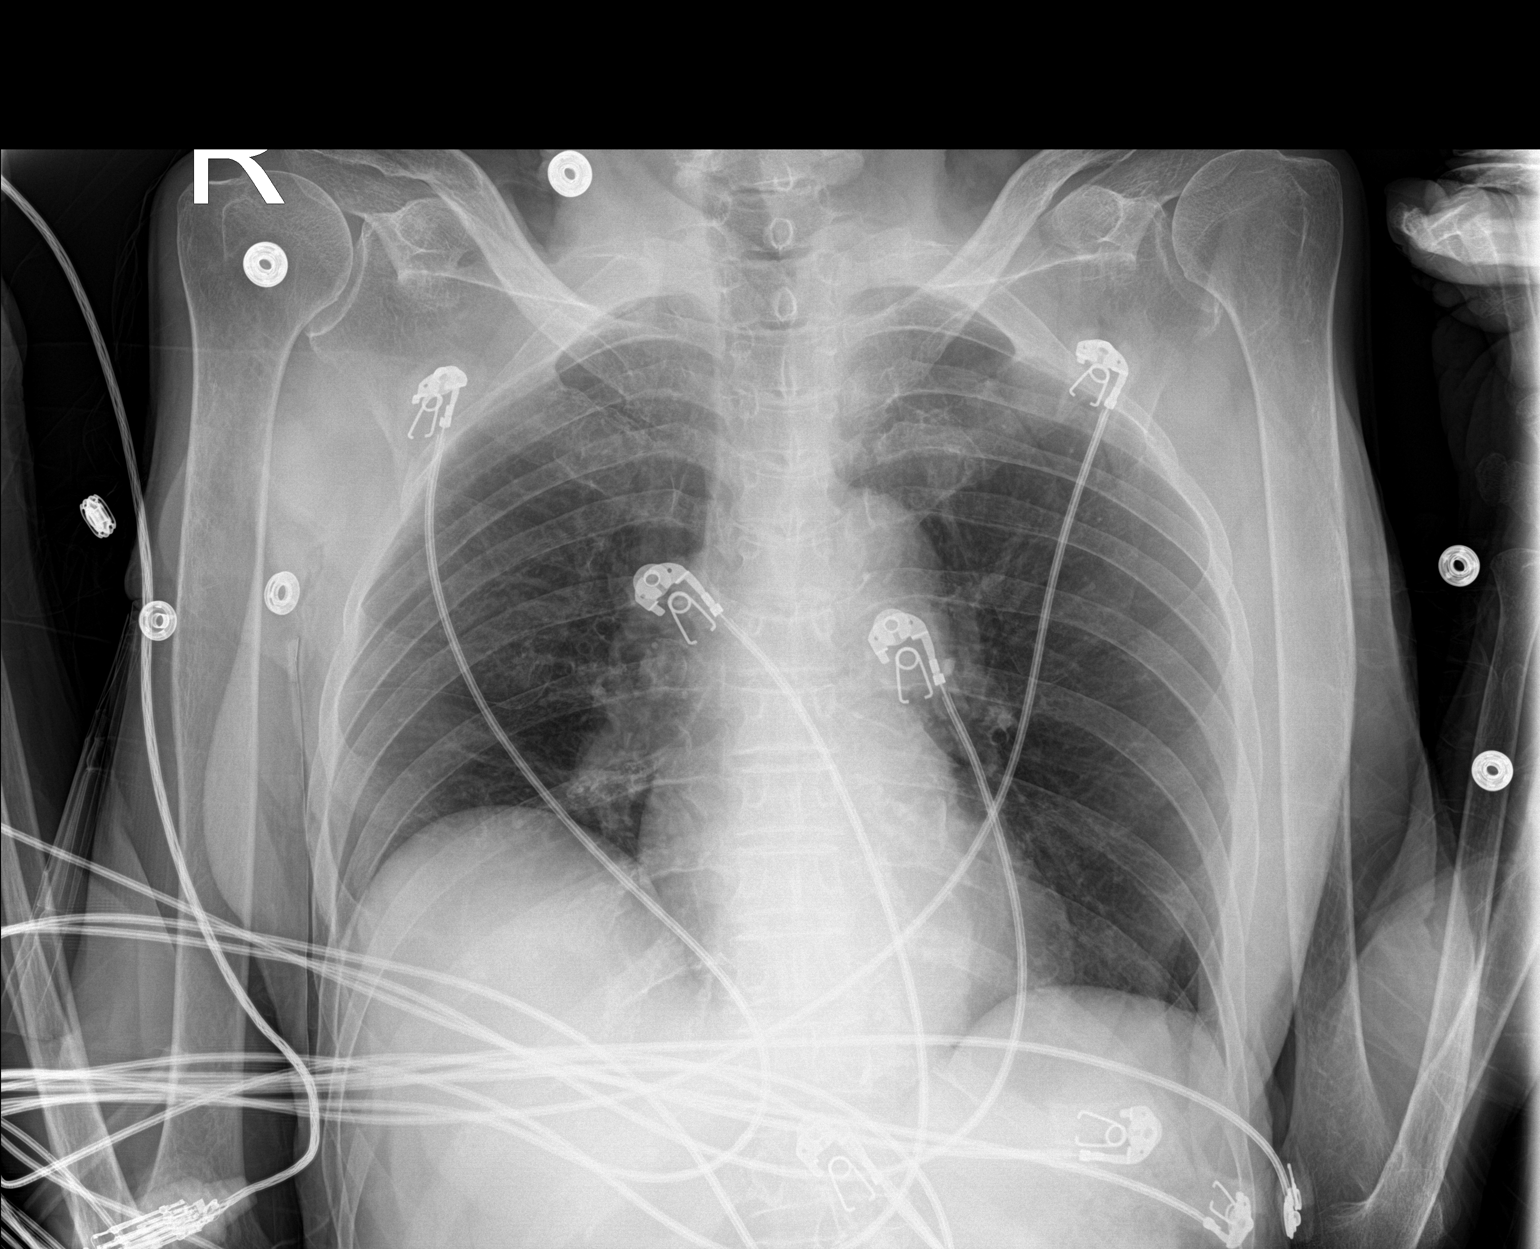

[1 of 1 positions shown; findings below may reference images not displayed]

FINDINGS: Lungs are clear. Heart size and pulmonary vascularity are within
normal limits. No adenopathy. No bone lesions. There is stable
elevation of the right hemidiaphragm. There is aortic
atherosclerosis.
IMPRESSION: Lungs clear.  Cardiac silhouette normal.

Aortic Atherosclerosis ([id number]).

## 2020-12-11 IMAGING — CT CT HEAD W/O CM
3 series · 15 of 47 positions shown, 18 images · non-contrast
Comparison: Brain MRI 07/12/2018.

CLINICAL DATA: Mental status change, unknown cause; altered mental
status. Hypotension.

EXAM:
CT HEAD WITHOUT CONTRAST
TECHNIQUE: Contiguous axial images were obtained from the base of the skull
through the vertex without intravenous contrast.

[Series 3: head 5.0 h30s · axial · 0.41mm/px · z∈[+1315,+1440]mm · 9 of 31 slices shown, 12 images]
[im 3/31  brain]
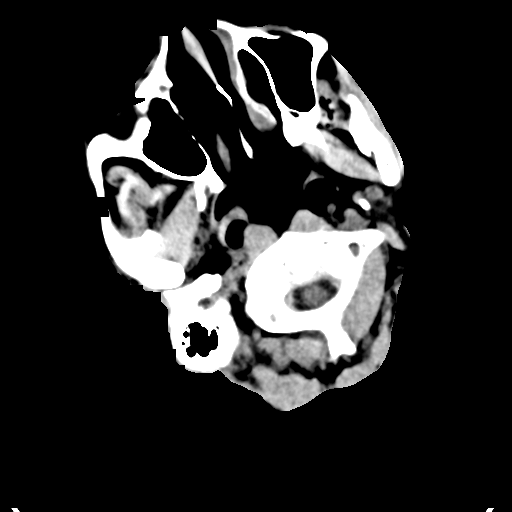
[im 3/31  bone]
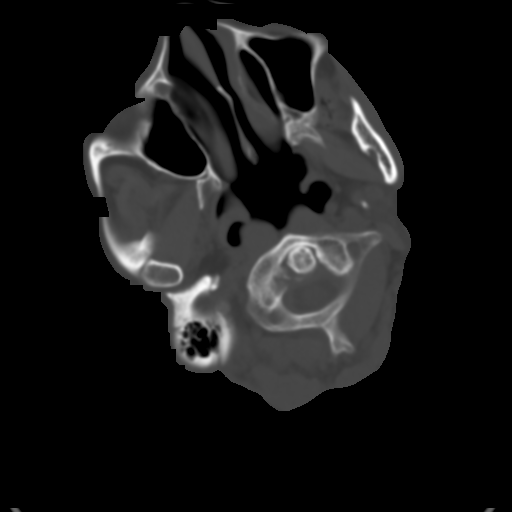
[im 6/31  brain]
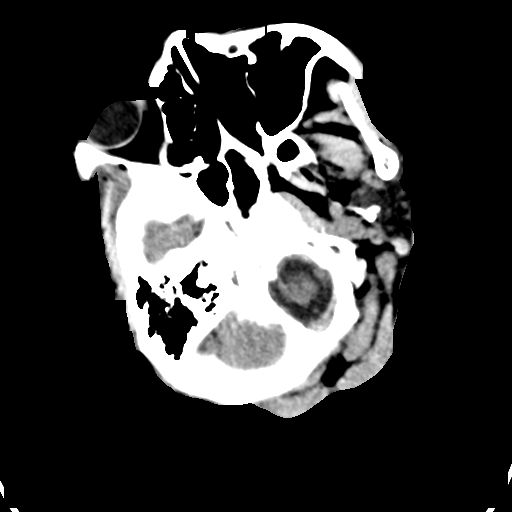
[im 9/31  brain]
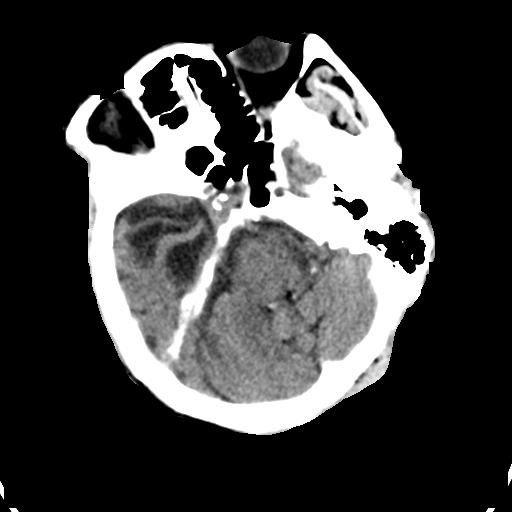
[im 12/31  brain]
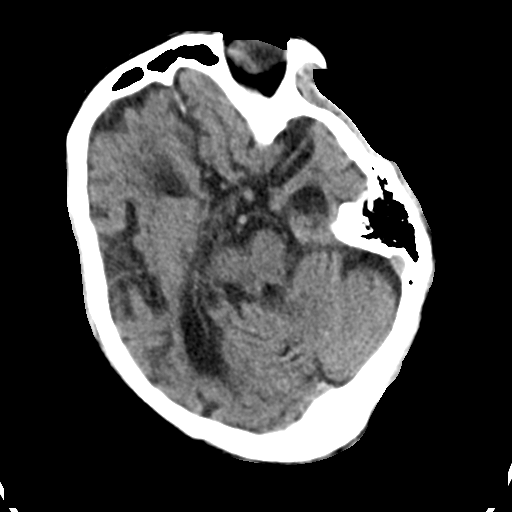
[im 16/31  brain]
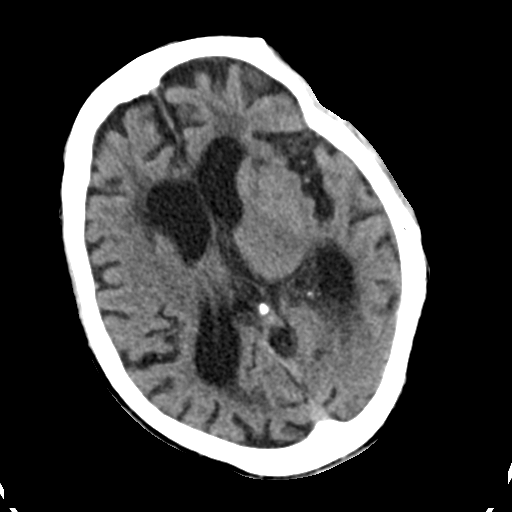
[im 16/31  bone]
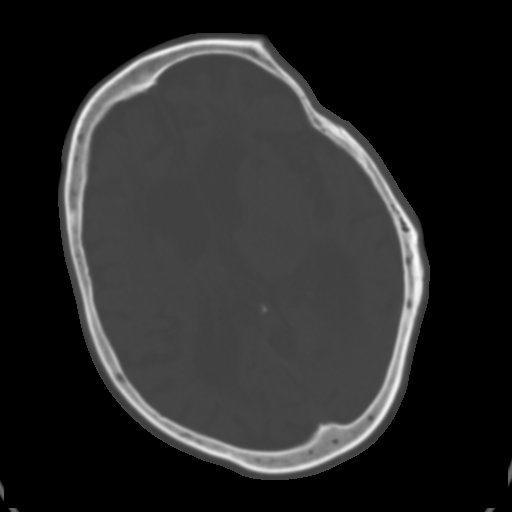
[im 19/31  brain]
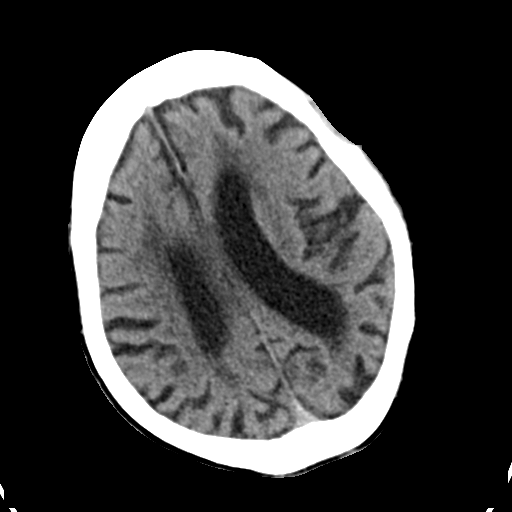
[im 22/31  brain]
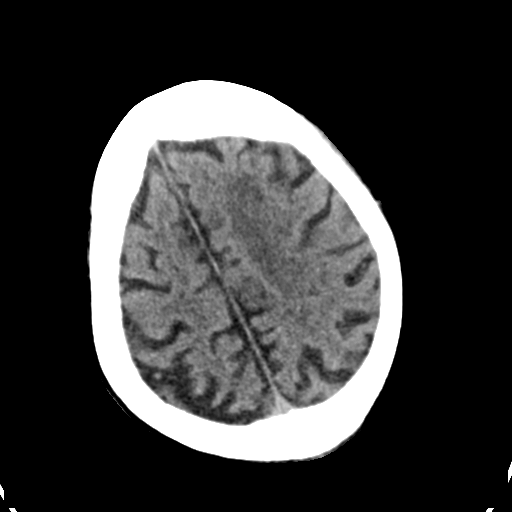
[im 25/31  brain]
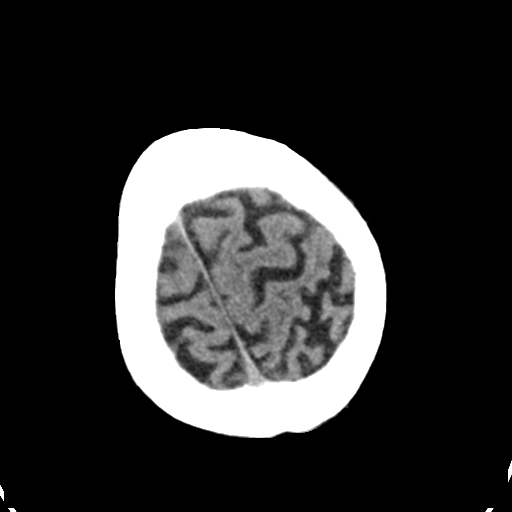
[im 28/31  brain]
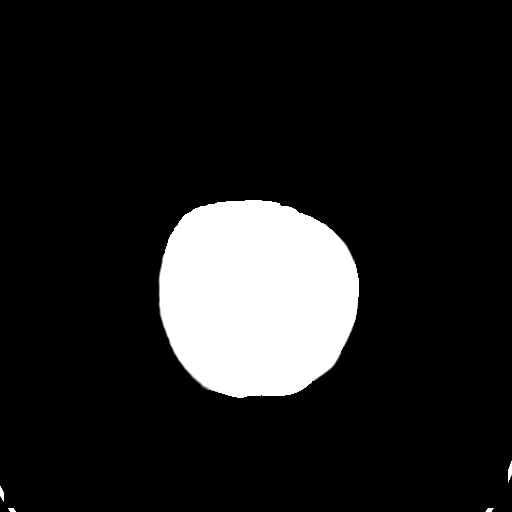
[im 28/31  bone]
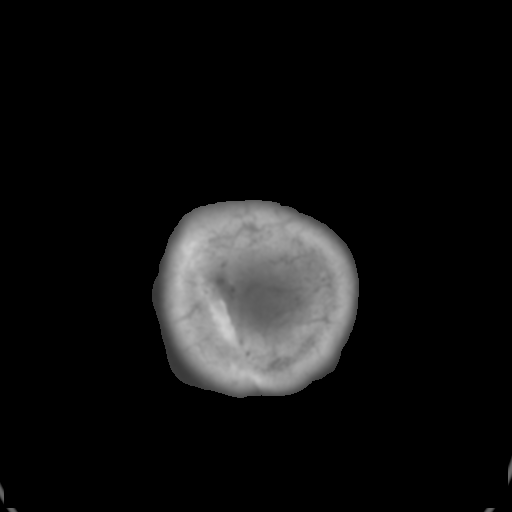

[Series 5: head 3.0 mpr cor · coronal · 0.29mm/px · 3 of 67 slices shown]
[im 23/67  brain]
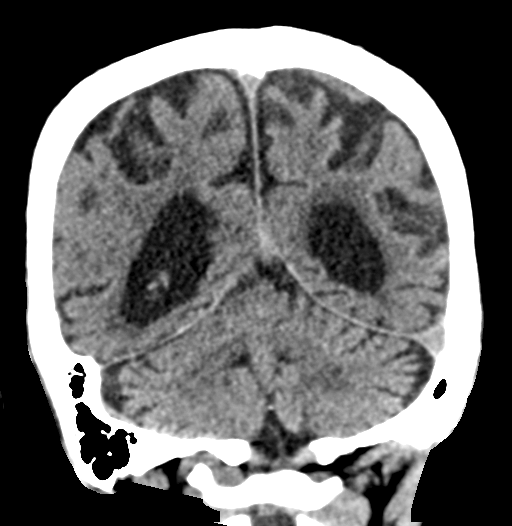
[im 30/67  brain]
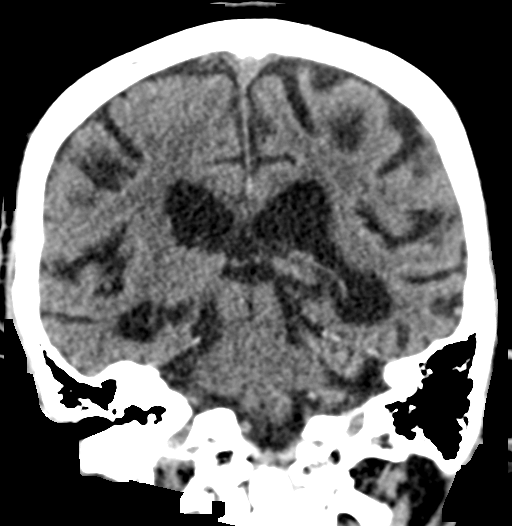
[im 37/67  brain]
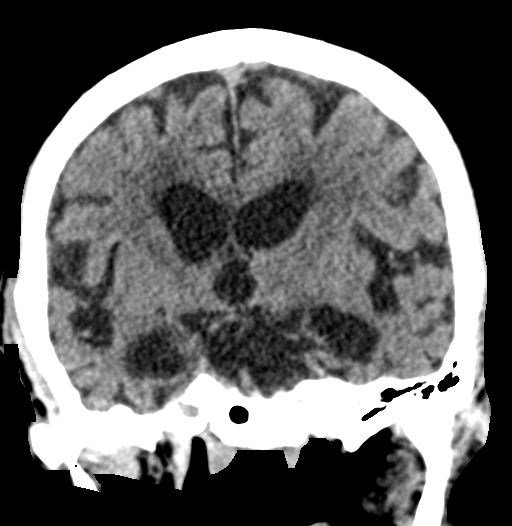

[Series 6: head 3.0 mpr sag · sagittal · 0.31mm/px · 3 of 62 slices shown]
[im 25/62  brain]
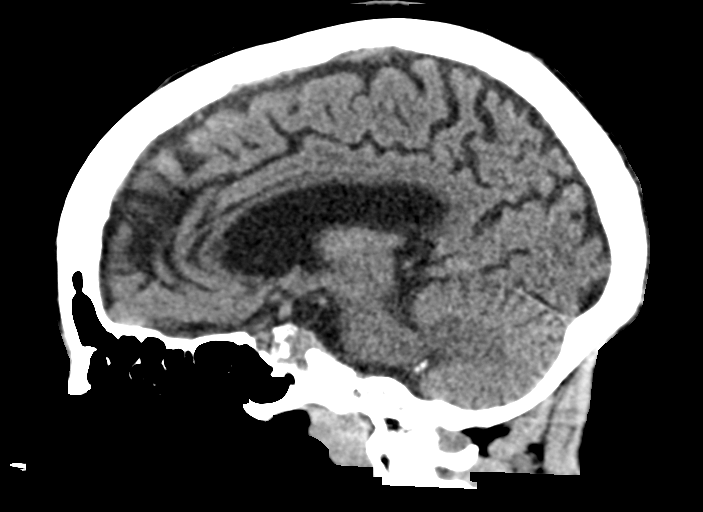
[im 29/62  brain]
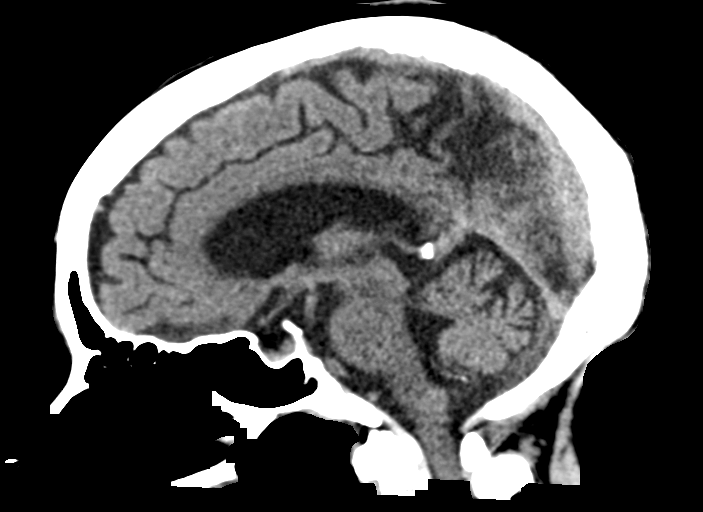
[im 33/62  brain]
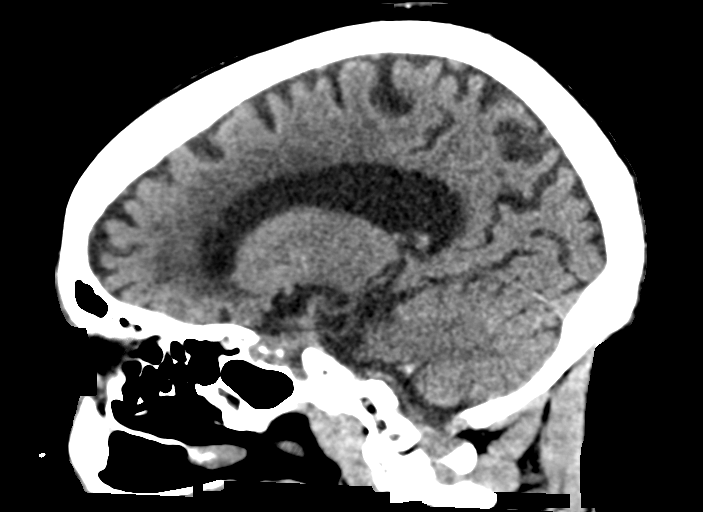

[15 of 47 positions shown; findings below may reference images not displayed]

FINDINGS: Brain:

Moderate to moderately advanced cerebral atrophy.

Moderate ill-defined hypoattenuation within the cerebral white
matter is nonspecific, but compatible with chronic small vessel
ischemic disease.

There is no acute intracranial hemorrhage.

No demarcated cortical infarct.

No extra-axial fluid collection.

No evidence of intracranial mass.

No midline shift.

Vascular: No hyperdense vessel.  Atherosclerotic calcifications.

Skull: Normal. Negative for fracture or focal lesion.

Sinuses/Orbits: Visualized orbits show no acute finding. Trace
scattered paranasal sinus mucosal thickening at the imaged levels.
No significant mastoid effusion.
IMPRESSION: No evidence of acute intracranial abnormality.

Redemonstrated moderate to moderately advanced cerebral atrophy and
moderate chronic small vessel ischemic disease.
